# Patient Record
Sex: Female | Born: 1960 | Race: Black or African American | Hispanic: No | Smoking: Never smoker
Health system: Southern US, Community
[De-identification: ages and names within clinical notes are randomized; demographics above are authoritative.]

## PROBLEM LIST (undated history)

## (undated) DIAGNOSIS — I10 Essential (primary) hypertension: Secondary | ICD-10-CM

## (undated) DIAGNOSIS — K219 Gastro-esophageal reflux disease without esophagitis: Secondary | ICD-10-CM

## (undated) DIAGNOSIS — E785 Hyperlipidemia, unspecified: Secondary | ICD-10-CM

## (undated) DIAGNOSIS — J45909 Unspecified asthma, uncomplicated: Secondary | ICD-10-CM

## (undated) HISTORY — DX: Gastro-esophageal reflux disease without esophagitis: K21.9

## (undated) HISTORY — DX: Hyperlipidemia, unspecified: E78.5

---

## 2015-04-07 DIAGNOSIS — M5440 Lumbago with sciatica, unspecified side: Secondary | ICD-10-CM | POA: Insufficient documentation

## 2015-04-07 DIAGNOSIS — E785 Hyperlipidemia, unspecified: Secondary | ICD-10-CM | POA: Insufficient documentation

## 2015-04-07 DIAGNOSIS — G43009 Migraine without aura, not intractable, without status migrainosus: Secondary | ICD-10-CM | POA: Insufficient documentation

## 2015-04-07 DIAGNOSIS — F419 Anxiety disorder, unspecified: Secondary | ICD-10-CM | POA: Insufficient documentation

## 2015-04-07 DIAGNOSIS — G4700 Insomnia, unspecified: Secondary | ICD-10-CM | POA: Insufficient documentation

## 2015-04-07 DIAGNOSIS — G4733 Obstructive sleep apnea (adult) (pediatric): Secondary | ICD-10-CM | POA: Insufficient documentation

## 2015-04-07 DIAGNOSIS — H8110 Benign paroxysmal vertigo, unspecified ear: Secondary | ICD-10-CM | POA: Insufficient documentation

## 2015-04-07 DIAGNOSIS — M5416 Radiculopathy, lumbar region: Secondary | ICD-10-CM | POA: Insufficient documentation

## 2015-04-07 DIAGNOSIS — G5600 Carpal tunnel syndrome, unspecified upper limb: Secondary | ICD-10-CM | POA: Insufficient documentation

## 2015-04-07 DIAGNOSIS — I10 Essential (primary) hypertension: Secondary | ICD-10-CM | POA: Insufficient documentation

## 2015-04-07 DIAGNOSIS — G8929 Other chronic pain: Secondary | ICD-10-CM | POA: Insufficient documentation

## 2015-11-29 DIAGNOSIS — J309 Allergic rhinitis, unspecified: Secondary | ICD-10-CM | POA: Insufficient documentation

## 2015-11-29 DIAGNOSIS — E6609 Other obesity due to excess calories: Secondary | ICD-10-CM | POA: Insufficient documentation

## 2016-02-24 HISTORY — PX: ANKLE SURGERY: SHX546

## 2016-03-02 DIAGNOSIS — G8929 Other chronic pain: Secondary | ICD-10-CM | POA: Insufficient documentation

## 2016-03-16 DIAGNOSIS — M7671 Peroneal tendinitis, right leg: Secondary | ICD-10-CM | POA: Insufficient documentation

## 2016-07-03 DIAGNOSIS — J4540 Moderate persistent asthma, uncomplicated: Secondary | ICD-10-CM | POA: Insufficient documentation

## 2018-02-23 HISTORY — PX: LUMBAR FUSION: SHX111

## 2018-09-16 DIAGNOSIS — Z981 Arthrodesis status: Secondary | ICD-10-CM | POA: Insufficient documentation

## 2018-10-19 DIAGNOSIS — R7301 Impaired fasting glucose: Secondary | ICD-10-CM | POA: Insufficient documentation

## 2018-10-19 DIAGNOSIS — R7303 Prediabetes: Secondary | ICD-10-CM | POA: Insufficient documentation

## 2020-04-01 ENCOUNTER — Emergency Department
Admission: EM | Admit: 2020-04-01 | Discharge: 2020-04-01 | Disposition: A | Discharge status: Home / Self Care | Payer: PRIVATE HEALTH INSURANCE | Source: Home / Self Care

## 2020-04-01 ENCOUNTER — Other Ambulatory Visit: Payer: Self-pay

## 2020-04-01 ENCOUNTER — Encounter: Payer: Self-pay | Admitting: Emergency Medicine

## 2020-04-01 DIAGNOSIS — R000 Tachycardia, unspecified: Secondary | ICD-10-CM

## 2020-04-01 DIAGNOSIS — R197 Diarrhea, unspecified: Secondary | ICD-10-CM

## 2020-04-01 DIAGNOSIS — R112 Nausea with vomiting, unspecified: Secondary | ICD-10-CM | POA: Diagnosis not present

## 2020-04-01 DIAGNOSIS — I959 Hypotension, unspecified: Secondary | ICD-10-CM

## 2020-04-01 DIAGNOSIS — R55 Syncope and collapse: Secondary | ICD-10-CM

## 2020-04-01 DIAGNOSIS — R509 Fever, unspecified: Secondary | ICD-10-CM

## 2020-04-01 HISTORY — DX: Unspecified asthma, uncomplicated: J45.909

## 2020-04-01 HISTORY — DX: Essential (primary) hypertension: I10

## 2020-04-01 NOTE — Discharge Instructions (Addendum)
°  You have declined EMS transport but based on your vital signs, it is advised you go to the emergency department for further evaluation and treatment of your symptoms.  Please have your family member drive you directly to the hospital.

## 2020-04-01 NOTE — ED Provider Notes (Signed)
[facility]ica [facility]    CSN: [id number] Arrival date & time: 04/01/20  1930      History   Chief Complaint Chief Complaint  Patient presents with  . Fatigue  . Fever    HPI Jessica Huff is a 60 y.o. female.   HPI  Jessica Huff is a 60 y.o. female presenting to UC with c/o fever Tmax 103, generalized weakness, n/v/d and near syncope that all started yesterday. Pt reports not "fully" passing out but did fall and bumped the front of her head in the bathroom. She had 3 episodes of vomiting and diarrhea yesterday. Denies cough or congestion, denies urinary symptoms. No known sick contacts. 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  Denies chest pain, palpitations or SOB. Pt driven to [facility] by family.  Past Medical History:  Diagnosis Date  . Asthma   . Hypertension     Patient Active Problem List   Diagnosis Date Noted  . IFG (impaired fasting glucose) 10/19/2018  . Peroneal tendonitis, right 03/16/2016  . Allergic rhinitis 11/29/2015  . Benign positional vertigo 04/07/2015  . Chronic bilateral low back pain with sciatica 04/07/2015  . Common migraine 04/07/2015  . Hyperlipidemia 04/07/2015  . Hypertension 04/07/2015  . Insomnia 04/07/2015  . Obstructive sleep apnea 04/07/2015    History reviewed. No pertinent surgical history.  OB History   No obstetric history on file.      Home Medications    Prior to Admission medications   Medication Sig Start Date End Date Taking? Authorizing Provider  albuterol (PROVENTIL) (2.5 MG/3ML) 0.083% nebulizer solution Inhale into the lungs. 02/05/20  Yes [provider]  DULoxetine (CYMBALTA) 60 MG capsule Take by mouth. 10/05/19  Yes [provider]  ezetimibe (ZETIA) 10 MG tablet Take 1 tablet by mouth daily. 01/30/20  Yes [provider]  fluticasone (FLONASE) 50 MCG/ACT nasal spray 2 sprays by Each Nare route daily. 05/22/15  Yes [provider]  levocetirizine (XYZAL) 5 MG tablet Take by  mouth. 07/17/19  Yes [provider]  rosuvastatin (CRESTOR) 40 MG tablet Take 1 tablet by mouth daily. 06/26/16  Yes [provider]  losartan-hydrochlorothiazide (HYZAAR) 100-25 MG tablet Take 1 tablet by mouth daily. 03/20/20   [provider]  Multiple Vitamin (MULTI-VITAMIN) tablet Take 1 tablet by mouth daily.    [provider]    Family History Family History  Problem Relation Age of Onset  . Cancer Father     Social History Social History   Tobacco Use  . Smoking status: Never Smoker  . Smokeless tobacco: Never Used  Vaping Use  . Vaping Use: Never used  Substance Use Topics  . Alcohol use: Not Currently  . Drug use: Never     Allergies   Patient has no known allergies.   Review of Systems Review of Systems  Constitutional: Positive for fatigue and fever. Negative for chills.  HENT: Negative for congestion, ear pain, sore throat, trouble swallowing and voice change.   Respiratory: Negative for cough and shortness of breath.   Cardiovascular: Negative for chest pain and palpitations.  Gastrointestinal: Positive for diarrhea, nausea and vomiting. Negative for abdominal pain.  Genitourinary: Negative for dysuria, frequency, hematuria and urgency.  Musculoskeletal: Positive for back pain and myalgias. Negative for arthralgias.  Skin: Negative for rash.  Neurological: Positive for syncope (near), weakness, light-headedness and headaches. Negative for dizziness.  All other systems reviewed and are negative.    Physical Exam Triage Vital Signs ED Triage  Vitals  Enc Vitals Group     BP 04/01/20 1947 91/64     Pulse Rate 04/01/20 1947 (!) 125     Resp 04/01/20 1947 20     Temp 04/01/20 1947 100.2 F (37.9 C)     Temp Source 04/01/20 1947 Oral     SpO2 04/01/20 1947 96 %     Weight 04/01/20 1946 222 lb (100.7 kg)     Height 04/01/20 1946 5\' 11"  (1.803 m)     Head Circumference --      Peak Flow --      Pain Score --      Pain  Loc --      Pain Edu? --      Excl. in GC? --    No data found.  Updated Vital Signs BP 91/64 (BP Location: Right Arm)   Pulse (!) 125   Temp 100.2 F (37.9 C) (Oral)   Resp 20   Ht 5\' 11"  (1.803 m)   Wt 222 lb (100.7 kg)   SpO2 96%   BMI 30.96 kg/m   Visual Acuity Right Eye Distance:   Left Eye Distance:   Bilateral Distance:    Right Eye Near:   Left Eye Near:    Bilateral Near:     Physical Exam Vitals and nursing note reviewed.  Constitutional:      General: She is not in acute distress.    Appearance: Normal appearance. She is well-developed and well-nourished. She is not ill-appearing, toxic-appearing or diaphoretic.  HENT:     Head: Normocephalic and atraumatic.     Right Ear: Tympanic membrane and ear canal normal.     Left Ear: Tympanic membrane and ear canal normal.     Nose: Nose normal.     Mouth/Throat:     Mouth: Mucous membranes are moist.     Pharynx: Oropharynx is clear.  Eyes:     Extraocular Movements: EOM normal.  Cardiovascular:     Rate and Rhythm: Regular rhythm. Tachycardia present.  Pulmonary:     Effort: Pulmonary effort is normal. No respiratory distress.     Breath sounds: Normal breath sounds. No stridor. No wheezing, rhonchi or rales.  Abdominal:     General: There is no distension.     Palpations: Abdomen is soft.     Tenderness: There is no abdominal tenderness. There is no right CVA tenderness or left CVA tenderness.  Musculoskeletal:        General: Normal range of motion.     Cervical back: Normal range of motion.  Skin:    General: Skin is warm and dry.  Neurological:     Mental Status: She is alert and oriented to person, place, and time.  Psychiatric:        Mood and Affect: Mood and affect normal.        Behavior: Behavior normal.      UC Treatments / Results  Labs (all labs ordered are listed, but only abnormal results are displayed) Labs Reviewed - No data to display  EKG   Radiology No results  found.  Procedures Procedures (including critical care time)  Medications Ordered in UC Medications - No data to display  Initial Impression / Assessment and Plan / UC Course  I have reviewed the triage vital signs and the nursing notes.  Pertinent labs & imaging results that were available during my care of the patient were reviewed by me and considered in my medical decision making (  see chart for details).    Pt is febrile, tachycardic, and hypotensive No specific infection noted on exam Recommend further evaluation and treatment in emergency department. Pt understanding and agreeable with tx plan Declined EMS transport. Will have family member drive her to [facility] Emergency Department. Pt escorted out to car by nursing staff.   Final Clinical Impressions(s) / UC Diagnoses   Final diagnoses:  Tachycardia  Hypotension, unspecified hypotension type  Near syncope  Nausea vomiting and diarrhea  Fever in adult     Discharge Instructions      You have declined EMS transport but based on your vital signs, it is advised you go to the emergency department for further evaluation and treatment of your symptoms.  Please have your family member drive you directly to the hospital.     ED Prescriptions    None     PDMP not reviewed this encounter.   Lurene Shadow, [place] 04/01/20 2023

## 2020-04-01 NOTE — ED Notes (Signed)
Patient is being discharged from the Urgent Care and sent to [facility] Emergency Department via POV w/ sister  & pt's son. Per Waylan Rocher, PA-C., patient is in need of higher level of care due to fever, tachycardia, low BP & hitting head in bath tub on 03/31/20.  Patient & pt's son is aware and verbalizes understanding of plan of care.  Vitals:   04/01/20 1947  BP: 91/64  Pulse: (!) 125  Resp: 20  Temp: 100.2 F (37.9 C)  SpO2: 96%

## 2020-04-01 NOTE — ED Triage Notes (Signed)
Fever, fatigue & weakness since yesterday  Pt fell yesterday in bathroom and bumped her head- denies LOC  COVID booster Pfizer 02/08/20

## 2020-05-10 ENCOUNTER — Ambulatory Visit: Payer: Self-pay | Admitting: Family Medicine

## 2020-05-22 ENCOUNTER — Ambulatory Visit: Payer: Self-pay | Admitting: Family Medicine

## 2020-07-11 ENCOUNTER — Ambulatory Visit: Payer: Self-pay | Admitting: Family Medicine

## 2020-07-16 ENCOUNTER — Ambulatory Visit (INDEPENDENT_AMBULATORY_CARE_PROVIDER_SITE_OTHER): Payer: PRIVATE HEALTH INSURANCE | Admitting: Family Medicine

## 2020-07-16 ENCOUNTER — Other Ambulatory Visit: Payer: Self-pay

## 2020-07-16 ENCOUNTER — Encounter: Payer: Self-pay | Admitting: Family Medicine

## 2020-07-16 VITALS — BP 140/72 | HR 75 | Temp 97.6°F | Ht 71.0 in | Wt 230.0 lb

## 2020-07-16 DIAGNOSIS — E785 Hyperlipidemia, unspecified: Secondary | ICD-10-CM

## 2020-07-16 DIAGNOSIS — J301 Allergic rhinitis due to pollen: Secondary | ICD-10-CM

## 2020-07-16 DIAGNOSIS — I10 Essential (primary) hypertension: Secondary | ICD-10-CM | POA: Diagnosis not present

## 2020-07-16 DIAGNOSIS — J4540 Moderate persistent asthma, uncomplicated: Secondary | ICD-10-CM

## 2020-07-16 DIAGNOSIS — F419 Anxiety disorder, unspecified: Secondary | ICD-10-CM

## 2020-07-16 DIAGNOSIS — R7301 Impaired fasting glucose: Secondary | ICD-10-CM

## 2020-07-16 MED ORDER — FLUTICASONE PROPIONATE 50 MCG/ACT NA SUSP: 2.0000 | Freq: Every day | NASAL | 6 refills | Status: DC

## 2020-07-16 MED ORDER — FLOVENT HFA 110 MCG/ACT IN AERO: 1.0000 | INHALATION_SPRAY | Freq: Two times a day (BID) | RESPIRATORY_TRACT | 12 refills | Status: DC

## 2020-07-16 MED ORDER — EZETIMIBE 10 MG PO TABS: 10.0000 mg | ORAL_TABLET | Freq: Every day | ORAL | 3 refills | Status: DC

## 2020-07-16 MED ORDER — DULOXETINE HCL 60 MG PO CPEP: 60.0000 mg | ORAL_CAPSULE | Freq: Every day | ORAL | 3 refills | Status: DC

## 2020-07-16 MED ORDER — LOSARTAN POTASSIUM-HCTZ 100-25 MG PO TABS: 1.0000 | ORAL_TABLET | Freq: Every day | ORAL | 3 refills | Status: DC

## 2020-07-16 MED ORDER — ROSUVASTATIN CALCIUM 40 MG PO TABS: 40.0000 mg | ORAL_TABLET | Freq: Every day | ORAL | 3 refills | Status: DC

## 2020-07-16 MED ORDER — ALBUTEROL SULFATE HFA 108 (90 BASE) MCG/ACT IN AERS: 2.0000 | INHALATION_SPRAY | Freq: Four times a day (QID) | RESPIRATORY_TRACT | 6 refills | Status: DC | PRN

## 2020-07-16 NOTE — Progress Notes (Signed)
[facility] [address] [place] [address] Dept: [phone] Dept Fax: [phone]  New Patient Office Visit  Subjective:    Patient ID: Jessica Huff, female    DOB: 02/01/1961, 60 y.o..   MRN: [id number]  Chief Complaint  Patient presents with  . Establish Care    NP- establish care.  HTN/meds.      History of Present Illness:  Patient is in today to establish care. Jessica Huff is originally from [place], [place]. Her family moved to [facility] when she was 60 years old. She lived in [place], [place] for about 11 years, but returned to [place]. Jessica Huff is divorced. She has two sons (40, 22). 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 She denies tobacco or drug use. She does drink very intermittently in social settings.  Jessica Huff has a history of hypertension, managed with losartan/HCTZ. She also has hyperlipidemia and is on both rosuvastatin and ezetimibe. Additionally, she has had some elevated blood sugars in the past.  Jessica Huff has a history fo migraine headache.s These typically occur around episodes of weather change. She has associated phonophobia and photophobia. She notes she usually does not take any medication for these, but sleeps it off until it is better.  Jessica Huff has a history of OSA. She does have a CPAP machine. She notes the straps on her mask are loose. She tried ordering some through [place], but they don't seem to work as well. She notes that she does not necessarily feel rested when she wakes up in the morning.  Jessica Huff has a history of asthma. She notes that she has been using her albuterol inhaler daily. She has used a nebulizer in the past, but not currently. She is not treated with a controller medication.  Jessica Huff has a history of low back pain with sciatica. She had a fusion of her L4-L5 vertebrae in 2020. She still notes some right leg weakness at times despite this.  Ms.  Huff has occasional episodes of vertigo. She notes this around times of weather changes. Most of the time this does not give her issuer.  Jessica Huff has a history of insomnia and associated depression/anxiety. She notes she has been treated with Cymbalta for this. She ran out of her needs several weeks ago. She notes that she is feeling recurrent symptoms off of her meds.  Past Medical History: Patient Active Problem List   Diagnosis Date Noted  . IFG (impaired fasting glucose) 10/19/2018  . S/P lumbar fusion 09/16/2018  . Asthma in adult, mild persistent, uncomplicated 07/03/2016  . Chronic pain of right ankle 03/02/2016  . Allergic rhinitis 11/29/2015  . Class 1 obesity due to excess calories with serious comorbidity and body mass index (BMI) of 32.0 to 32.9 in adult 11/29/2015  . Benign positional vertigo 04/07/2015  . Chronic bilateral low back pain with sciatica 04/07/2015  . Common migraine 04/07/2015  . Hyperlipidemia 04/07/2015  . Essential hypertension 04/07/2015  . Insomnia 04/07/2015  . Obstructive sleep apnea 04/07/2015  . Lumbar radiculopathy 04/07/2015  . Anxiety 04/07/2015   Past Surgical History:  Procedure Laterality Date  . ANKLE SURGERY    . BACK SURGERY     Family History  Problem Relation Age of Onset  . Cancer Father        Kidney   Outpatient Medications Prior to Visit  Medication Sig Dispense Refill  . levocetirizine (XYZAL) 5 MG tablet Take by  mouth.    . Multiple Vitamin (MULTI-VITAMIN) tablet Take 1 tablet by mouth daily.    Marland Kitchen albuterol (PROVENTIL) (2.5 MG/3ML) 0.083% nebulizer solution Inhale into the lungs.    . DULoxetine (CYMBALTA) 60 MG capsule Take by mouth.    . ezetimibe (ZETIA) 10 MG tablet Take 1 tablet by mouth daily.    . fluticasone (FLONASE) 50 MCG/ACT nasal spray 2 sprays by Each Nare route daily.    Marland Kitchen losartan-hydrochlorothiazide (HYZAAR) 100-25 MG tablet Take 1 tablet by mouth daily.    . rosuvastatin (CRESTOR) 40 MG tablet  Take 1 tablet by mouth daily.    Marland Kitchen albuterol (VENTOLIN HFA) 108 (90 Base) MCG/ACT inhaler Inhale 2 puffs into the lungs every 6 (six) hours as needed.     No facility-administered medications prior to visit.   No Known Allergies    Objective:   Today's Vitals   07/16/20 1507  BP: 140/72  Pulse: 75  Temp: 97.6 F (36.4 C)  TempSrc: Temporal  SpO2: 98%  Weight: 230 lb (104.3 kg)  Height: 5\' 11"  (1.803 m)   Body mass index is 32.08 kg/m.   General: Well developed, well nourished. No acute distress. Lungs: Clear to auscultation bilaterally. No wheezing, rales or rhonchi. CV: RRR without murmurs or rubs. Pulses 2+ bilaterally. Psych: Alert and oriented. Normal mood and affect.  Health Maintenance Due  Topic Date Due  . HIV Screening  Never done  . Hepatitis C Screening  Never done  . PAP SMEAR-Modifier  04/12/2020     Assessment & Plan:   1. Essential hypertension The systolic blood pressure is a little high today. I recommend we reassess this at her next visit and consider therapy modifications.  - losartan-hydrochlorothiazide (HYZAAR) 100-25 MG tablet; Take 1 tablet by mouth daily.  Dispense: 90 tablet; Refill: 3  2. Moderate persistent asthma without complication based on her daily symptoms, Jessica Huff has moderate persistent asthma. I will add a daily steroid inhaler and have her back within a month to reassess her response. We did discuss the role of quick relief inhalers vs. controller meds in the overall management plan.  - albuterol (VENTOLIN HFA) 108 (90 Base) MCG/ACT inhaler; Inhale 2 puffs into the lungs every 6 (six) hours as needed.  Dispense: 6.7 g; Refill: 6 - fluticasone (FLOVENT HFA) 110 MCG/ACT inhaler; Inhale 1 puff into the lungs 2 (two) times daily.  Dispense: 1 each; Refill: 12  3. IFG (impaired fasting glucose) We will cehck to make sure she has not developed diabetes.  - Hemoglobin A1c; Future  4. Hyperlipidemia, unspecified hyperlipidemia  type Due for assessment. Continue Crestor and Zetia for now.  - Lipid panel; Future - ezetimibe (ZETIA) 10 MG tablet; Take 1 tablet (10 mg total) by mouth daily.  Dispense: 90 tablet; Refill: 3 - rosuvastatin (CRESTOR) 40 MG tablet; Take 1 tablet (40 mg total) by mouth daily.  Dispense: 90 tablet; Refill: 3  5. Anxiety I will restart her duloxetine and monitor for response.  - DULoxetine (CYMBALTA) 60 MG capsule; Take 1 capsule (60 mg total) by mouth daily.  Dispense: 90 capsule; Refill: 3  6. Allergic rhinitis due to pollen, unspecified seasonality We will continue her nasal steroid spray.  - fluticasone (FLONASE) 50 MCG/ACT nasal spray; Place 2 sprays into both nostrils daily.  Dispense: 9.9 mL; Refill: 6  Neale Burly, MD

## 2020-07-18 ENCOUNTER — Other Ambulatory Visit (INDEPENDENT_AMBULATORY_CARE_PROVIDER_SITE_OTHER): Payer: PRIVATE HEALTH INSURANCE

## 2020-07-18 ENCOUNTER — Other Ambulatory Visit: Payer: Self-pay

## 2020-07-18 ENCOUNTER — Encounter: Payer: Self-pay | Admitting: Family Medicine

## 2020-07-18 DIAGNOSIS — E785 Hyperlipidemia, unspecified: Secondary | ICD-10-CM | POA: Diagnosis not present

## 2020-07-18 DIAGNOSIS — R7301 Impaired fasting glucose: Secondary | ICD-10-CM

## 2020-07-18 LAB — LIPID PANEL
Cholesterol: 133 mg/dL (ref 0–200)
HDL: 33.1 mg/dL — ABNORMAL LOW (ref >39.00)
LDL Cholesterol: 78 mg/dL (ref 0–99)
NonHDL: 99.64
Total CHOL/HDL Ratio: 4
Triglycerides: 106 mg/dL (ref 0.0–149.0)
VLDL: 21.2 mg/dL (ref 0.0–40.0)

## 2020-07-18 LAB — HEMOGLOBIN A1C: Hgb A1c MFr Bld: 5.9 % (ref 4.6–6.5)

## 2020-07-18 NOTE — Progress Notes (Signed)
Per orders of Dr. Veto Kemps, pt is here for labs pt tolerated draw well.

## 2020-08-13 ENCOUNTER — Encounter: Payer: Self-pay | Admitting: Family Medicine

## 2020-08-13 ENCOUNTER — Other Ambulatory Visit: Payer: Self-pay

## 2020-08-13 ENCOUNTER — Other Ambulatory Visit (HOSPITAL_COMMUNITY)
Admission: RE | Admit: 2020-08-13 | Discharge: 2020-08-13 | Disposition: A | Discharge status: Home / Self Care | Payer: PRIVATE HEALTH INSURANCE | Source: Ambulatory Visit | Attending: Diagnostic Radiology | Admitting: Diagnostic Radiology

## 2020-08-13 ENCOUNTER — Ambulatory Visit (INDEPENDENT_AMBULATORY_CARE_PROVIDER_SITE_OTHER): Payer: PRIVATE HEALTH INSURANCE | Admitting: Family Medicine

## 2020-08-13 VITALS — BP 116/68 | HR 85 | Temp 97.4°F | Ht 71.0 in | Wt 231.6 lb

## 2020-08-13 DIAGNOSIS — M5441 Lumbago with sciatica, right side: Secondary | ICD-10-CM | POA: Diagnosis not present

## 2020-08-13 DIAGNOSIS — R7303 Prediabetes: Secondary | ICD-10-CM

## 2020-08-13 DIAGNOSIS — J4540 Moderate persistent asthma, uncomplicated: Secondary | ICD-10-CM | POA: Diagnosis not present

## 2020-08-13 DIAGNOSIS — F419 Anxiety disorder, unspecified: Secondary | ICD-10-CM

## 2020-08-13 DIAGNOSIS — E785 Hyperlipidemia, unspecified: Secondary | ICD-10-CM

## 2020-08-13 DIAGNOSIS — G8929 Other chronic pain: Secondary | ICD-10-CM

## 2020-08-13 DIAGNOSIS — Z124 Encounter for screening for malignant neoplasm of cervix: Secondary | ICD-10-CM | POA: Insufficient documentation

## 2020-08-13 DIAGNOSIS — I10 Essential (primary) hypertension: Secondary | ICD-10-CM

## 2020-08-13 NOTE — Progress Notes (Signed)
[facility] [address] [place] [address] Dept: [phone] Dept Fax: [phone]  Office Visit  Subjective:    Patient ID: Jessica Huff, female    DOB: [birth year], 60 y.o..   MRN: [id number]  Chief Complaint  Patient presents with   Gynecologic Exam    Lab follow-up and pap smear.  C/o having back pain.she has taken Gabapentin with little relief.    History of Present Illness:  Patient is in today to complete cervical cancer screening and reassessment from her last visit.  Jessica Huff has a history of high blood pressure. She is currently on Hyzaar. At her last visit, her BP was mildly high. She has been consistent with her medication use.   Jessica Huff has a history of moderate persistent asthma. I added a Flovent inhaler to her regimen at her last visit. She notes she has been able to reduce how often she is needing her albuterol. She only occasionally has nighttime symptoms (about once a week).  Jessica Huff restarted on Cymbalta at her last visit. She notes that this seems to be working well and her anxiety is in control.  Jessica Huff has a history of bilateral low back pain. She had an L4-L5 spinal fusion in 2020, primarily due to lower pain with right-sided sciatica. She has been having more recent lower back pain, more so  the left. She had some gabapentin that she recently restarted, but doesn't feel this is helping her pain. She notes it has been at least 2 years since her last imaging study.  Past Medical History: Patient Active Problem List   Diagnosis Date Noted   Prediabetes 10/19/2018   S/P lumbar fusion 09/16/2018   Asthma in adult, moderate persistent, uncomplicated 07/03/2016   Chronic pain of right ankle 03/02/2016   Allergic rhinitis 11/29/2015   Class 1 obesity due to excess calories with serious comorbidity and body mass index (BMI) of 32.0 to 32.9 in adult 11/29/2015   Benign positional vertigo  04/07/2015   Chronic bilateral low back pain with sciatica 04/07/2015   Common migraine 04/07/2015   Hyperlipidemia 04/07/2015   Essential hypertension 04/07/2015   Insomnia 04/07/2015   Obstructive sleep apnea 04/07/2015   Lumbar radiculopathy 04/07/2015   Anxiety 04/07/2015   Past Surgical History:  Procedure Laterality Date   ANKLE SURGERY Right 2018   LUMBAR FUSION  2020   L4-L5   Family History  Problem Relation Age of Onset   Diabetes Mother    Kidney disease Mother    Cancer Father        Kidney   Outpatient Medications Prior to Visit  Medication Sig Dispense Refill   albuterol (VENTOLIN HFA) 108 (90 Base) MCG/ACT inhaler Inhale 2 puffs into the lungs every 6 (six) hours as needed. 6.7 g 6   DULoxetine (CYMBALTA) 60 MG capsule Take 1 capsule (60 mg total) by mouth daily. 90 capsule 3   ezetimibe (ZETIA) 10 MG tablet Take 1 tablet (10 mg total) by mouth daily. 90 tablet 3   fluticasone (FLONASE) 50 MCG/ACT nasal spray Place 2 sprays into both nostrils daily. 9.9 mL 6   fluticasone (FLOVENT HFA) 110 MCG/ACT inhaler Inhale 1 puff into the lungs 2 (two) times daily. 1 each 12   levocetirizine (XYZAL) 5 MG tablet Take by mouth.     losartan-hydrochlorothiazide (HYZAAR) 100-25 MG tablet Take 1 tablet by mouth daily. 90 tablet 3   Multiple Vitamin (MULTI-VITAMIN) tablet Take 1 tablet by  mouth daily.     rosuvastatin (CRESTOR) 40 MG tablet Take 1 tablet (40 mg total) by mouth daily. 90 tablet 3   No facility-administered medications prior to visit.   No Known Allergies    Objective:   Today's Vitals   08/13/20 1535  BP: 116/68  Pulse: 85  Temp: (!) 97.4 F (36.3 C)  TempSrc: Temporal  SpO2: 96%  Weight: 231 lb 9.6 oz (105.1 kg)  Height: 5\' 11"  (1.803 m)   Body mass index is 32.3 kg/m.   General: Well developed, well nourished. No acute distress. GU: Normal external genitalia, Vaginal mucosa is pink. There is a moderate thick white discharge   present at the  cervix. The cervical mucosa is pink. Uterus is of normal size. There is no CMT. There are   no adnexal masses. Back: Straight. Surgical scar well healed. Mild pain on palpation over lower left paraspinal muscles. Psych: Alert and oriented. Normal mood and affect.  Health Maintenance Due  Topic Date Due   HIV Screening  Never done   Hepatitis C Screening  Never done   Zoster Vaccines- Shingrix (1 of 2) Never done   PAP SMEAR-Modifier  04/12/2020   COVID-19 Vaccine (4 - Booster for Pfizer series) 06/08/2020   Lab Results Lab Results  Component Value Date   HGBA1C 5.9 07/18/2020   Lab Results  Component Value Date   CHOL 133 07/18/2020   HDL 33.10 (L) 07/18/2020   LDLCALC 78 07/18/2020   TRIG 106.0 07/18/2020   CHOLHDL 4 07/18/2020     Assessment & Plan:   1. Essential hypertension Blood pressure is at goal today. We will continue Hyzaar and reassess in 3 months.  2. Asthma in adult, moderate persistent, uncomplicated Asthma is improved after addition of Flovent inhaler.  3. Chronic bilateral low back pain with right-sided sciatica I will check lumbar films to see if there is any new pathology and to assess status of prior lumbar fixation.  - DG Lumbar Spine Complete; Future  4. Hyperlipidemia, unspecified hyperlipidemia type Lipids are at goal on Crestor.  5. Prediabetes Remains in prediabetes range. We discussed weight loss through exercise as the primary approach to reducing her risk of progression to diabetes.  6. Anxiety Stable on Cymbalta.  7. Pap smear for cervical cancer screening  - Cytology - PAP  07/20/2020, MD

## 2020-08-15 ENCOUNTER — Other Ambulatory Visit: Payer: Self-pay

## 2020-08-15 ENCOUNTER — Ambulatory Visit (HOSPITAL_COMMUNITY)
Admission: RE | Admit: 2020-08-15 | Discharge: 2020-08-15 | Disposition: A | Discharge status: Home / Self Care | Payer: PRIVATE HEALTH INSURANCE | Source: Ambulatory Visit | Attending: Family Medicine | Admitting: Family Medicine

## 2020-08-15 DIAGNOSIS — G8929 Other chronic pain: Secondary | ICD-10-CM | POA: Insufficient documentation

## 2020-08-15 DIAGNOSIS — M5441 Lumbago with sciatica, right side: Secondary | ICD-10-CM | POA: Diagnosis present

## 2020-08-15 LAB — CYTOLOGY - PAP
Adequacy: ABSENT
Comment: NEGATIVE
Diagnosis: NEGATIVE
High risk HPV: NEGATIVE

## 2020-11-14 ENCOUNTER — Encounter: Payer: Self-pay | Admitting: Family Medicine

## 2020-11-14 ENCOUNTER — Ambulatory Visit (INDEPENDENT_AMBULATORY_CARE_PROVIDER_SITE_OTHER): Payer: PRIVATE HEALTH INSURANCE | Admitting: Family Medicine

## 2020-11-14 ENCOUNTER — Other Ambulatory Visit: Payer: Self-pay

## 2020-11-14 VITALS — BP 132/78 | HR 67 | Temp 98.5°F | Ht 70.98 in | Wt 228.0 lb

## 2020-11-14 DIAGNOSIS — J4540 Moderate persistent asthma, uncomplicated: Secondary | ICD-10-CM | POA: Diagnosis not present

## 2020-11-14 DIAGNOSIS — Z23 Encounter for immunization: Secondary | ICD-10-CM

## 2020-11-14 DIAGNOSIS — M5441 Lumbago with sciatica, right side: Secondary | ICD-10-CM | POA: Diagnosis not present

## 2020-11-14 DIAGNOSIS — G8929 Other chronic pain: Secondary | ICD-10-CM

## 2020-11-14 DIAGNOSIS — I10 Essential (primary) hypertension: Secondary | ICD-10-CM | POA: Diagnosis not present

## 2020-11-14 MED ORDER — AMITRIPTYLINE HCL 25 MG PO TABS
25.0000 mg | ORAL_TABLET | Freq: Every day | ORAL | 4 refills | Status: AC
Start: 2020-11-14 — End: ?

## 2020-11-14 NOTE — Progress Notes (Signed)
[facility] [address] [place] [address] Dept: [phone] Dept Fax: [phone]  Chronic Care Office Visit  Subjective:    Patient ID: Jessica Huff, female    DOB: April 08, 1960, 60 y.o..   MRN: [id number]  Chief Complaint  Patient presents with   Follow-up    History of Present Illness:  Patient is in today for reassessment of chronic medical issues.  Jessica Huff has a history of high blood pressure. She is currently on Hyzaa and managing well.   Jessica Huff has a history of moderate persistent asthma. I added a Flovent inhaler to her regimen earlier this year. she notes she is only using her albuterol about a once a week.   Jessica Huff has a history of bilateral low back pain. She had an L4-L5 spinal fusion in 2020, primarily due to lower pain with right-sided sciatica. She continues to experience low back pain related to her job. She has been off work the past two weeks and notes her pain is much less when she is not working. She was treated with gabapentin in the past, but this was not effective for her. She also had prior injections, without improvement. She notes there was a prior discussion of possible referral to a pain specialist, but that this never occurred.  Past Medical History: Patient Active Problem List   Diagnosis Date Noted   Prediabetes 10/19/2018   S/P lumbar fusion 09/16/2018   Asthma in adult, moderate persistent, uncomplicated 07/03/2016   Chronic pain of right ankle 03/02/2016   Allergic rhinitis 11/29/2015   Class 1 obesity due to excess calories with serious comorbidity and body mass index (BMI) of 32.0 to 32.9 in adult 11/29/2015   Benign positional vertigo 04/07/2015   Chronic bilateral low back pain with sciatica 04/07/2015   Common migraine 04/07/2015   Hyperlipidemia 04/07/2015   Essential hypertension 04/07/2015   Insomnia 04/07/2015   Obstructive sleep apnea 04/07/2015   Lumbar  radiculopathy 04/07/2015   Anxiety 04/07/2015   Past Surgical History:  Procedure Laterality Date   ANKLE SURGERY Right 2018   LUMBAR FUSION  2020   L4-L5   Family History  Problem Relation Age of Onset   Diabetes Mother    Kidney disease Mother    Cancer Father        Kidney   Outpatient Medications Prior to Visit  Medication Sig Dispense Refill   albuterol (VENTOLIN HFA) 108 (90 Base) MCG/ACT inhaler Inhale 2 puffs into the lungs every 6 (six) hours as needed. 6.7 g 6   DULoxetine (CYMBALTA) 60 MG capsule Take 1 capsule (60 mg total) by mouth daily. 90 capsule 3   ezetimibe (ZETIA) 10 MG tablet Take 1 tablet (10 mg total) by mouth daily. 90 tablet 3   fluticasone (FLONASE) 50 MCG/ACT nasal spray Place 2 sprays into both nostrils daily. 9.9 mL 6   fluticasone (FLOVENT HFA) 110 MCG/ACT inhaler Inhale 1 puff into the lungs 2 (two) times daily. 1 each 12   levocetirizine (XYZAL) 5 MG tablet Take by mouth.     losartan-hydrochlorothiazide (HYZAAR) 100-25 MG tablet Take 1 tablet by mouth daily. 90 tablet 3   Multiple Vitamin (MULTI-VITAMIN) tablet Take 1 tablet by mouth daily.     rosuvastatin (CRESTOR) 40 MG tablet Take 1 tablet (40 mg total) by mouth daily. 90 tablet 3   No facility-administered medications prior to visit.   No Known Allergies    Objective:   Today's Vitals  11/14/20 1101  BP: 132/78  Pulse: 67  Temp: 98.5 F (36.9 C)  SpO2: 96%  Weight: 228 lb (103.4 kg)  Height: 5' 10.98" (1.803 m)  PainSc: 0-No pain   Body mass index is 31.81 kg/m.   General: Well developed, well nourished. No acute distress. Lungs; Clear to auscultation without wheezes, rales, or rhonchi. Psych: Alert and oriented. Normal mood and affect.  Health Maintenance Due  Topic Date Due   HIV Screening  Never done   Hepatitis C Screening  Never done   Zoster Vaccines- Shingrix (1 of 2) Never done   COVID-19 Vaccine (4 - Booster for [employer] series) 06/08/2020   INFLUENZA VACCINE   Never done   Imaging Lumbar x-ray (08/15/2020) IMPRESSION: Postsurgical changes at L4-L5 with stable appearance of the hardware. Mild degenerative changes at L5-S1. Probable calcified uterine fibroid    Assessment & Plan:   1. Essential hypertension Blood pressure is acceptable today. We will continue Hyzaar.  2. Asthma in adult, moderate persistent, uncomplicated Asthma is in better control on Flovent, with only occasional use of albuterol.  3. Chronic bilateral low back pain with right-sided sciatica We will try adding a bedtime dose of amitriptyline to see if this will improve her pain/sleep. I will refer her to physiatry for assessment.  - amitriptyline (ELAVIL) 25 MG tablet; Take 1 tablet (25 mg total) by mouth at bedtime.  Dispense: 30 tablet; Refill: 4 - Ambulatory referral to Physical Medicine Rehab  Loyola Mast, MD

## 2020-11-26 ENCOUNTER — Encounter: Payer: Self-pay | Admitting: Physical Medicine and Rehabilitation

## 2020-12-26 LAB — HM MAMMOGRAPHY

## 2021-01-07 ENCOUNTER — Encounter: Payer: Self-pay | Admitting: Family Medicine

## 2021-01-12 ENCOUNTER — Encounter: Payer: Self-pay | Admitting: Physical Medicine and Rehabilitation

## 2021-02-07 ENCOUNTER — Other Ambulatory Visit: Payer: Self-pay

## 2021-02-07 ENCOUNTER — Ambulatory Visit (INDEPENDENT_AMBULATORY_CARE_PROVIDER_SITE_OTHER): Payer: PRIVATE HEALTH INSURANCE | Admitting: Family Medicine

## 2021-02-07 ENCOUNTER — Other Ambulatory Visit: Payer: Self-pay | Admitting: Family Medicine

## 2021-02-07 ENCOUNTER — Ambulatory Visit (INDEPENDENT_AMBULATORY_CARE_PROVIDER_SITE_OTHER): Payer: PRIVATE HEALTH INSURANCE

## 2021-02-07 ENCOUNTER — Encounter: Payer: Self-pay | Admitting: Family Medicine

## 2021-02-07 VITALS — BP 144/80 | HR 95 | Temp 98.3°F

## 2021-02-07 DIAGNOSIS — M25561 Pain in right knee: Secondary | ICD-10-CM

## 2021-02-07 DIAGNOSIS — R5383 Other fatigue: Secondary | ICD-10-CM | POA: Diagnosis not present

## 2021-02-07 DIAGNOSIS — R5381 Other malaise: Secondary | ICD-10-CM | POA: Diagnosis not present

## 2021-02-07 NOTE — Progress Notes (Signed)
Established Patient Office Visit  Subjective:  Patient ID: Jessica Huff, female    DOB: [birth year]  Age: 60 y.o. MRN: [id number]  CC:  Chief Complaint  Patient presents with   Leg Pain    Pt c/o swelling w/ pain in right knee after falling a day ago due to dizzyness. Pt states she has also ben running a fever. No congestion or cough.     HPI Jessica Huff presents for evaluation of a right knee injury that happened last night.  She was feeling weak and fell backwards and landed to the right with her knee in a flexed position.  He has been sore and swollen and it has been difficult for her to bear weight.  2 days ago she become overwhelmed with malaise and fatigue.  She felt lightheaded and ran a low temperature of 100 degrees.  She has had no headache, stuffy nose or drainage, sore throat, nausea or vomiting, diarrhea or other localizing signs.  The malaise fatigue and fever have passed.  She is feeling better  Past Medical History:  Diagnosis Date   Asthma    GERD (gastroesophageal reflux disease)    Hyperlipidemia    Hypertension     Past Surgical History:  Procedure Laterality Date   ANKLE SURGERY Right 2018   LUMBAR FUSION  2020   L4-L5    Family History  Problem Relation Age of Onset   Diabetes Mother    Kidney disease Mother    Cancer Father        Kidney    Social History   Socioeconomic History   Marital status: Divorced    Spouse name: Not on file   Number of children: 2   Years of education: Not on file   Highest education level: Not on file  Occupational History   Not on file  Tobacco Use   Smoking status: Never   Smokeless tobacco: Never  Vaping Use   Vaping Use: Never used  Substance and Sexual Activity   Alcohol use: Not Currently   Drug use: Never   Sexual activity: Yes  Other Topics Concern   Not on file  Social History Narrative   Not on file   Social Determinants of Health   Financial Resource Strain: Not on file  Food Insecurity:  Not on file  Transportation Needs: Not on file  Physical Activity: Not on file  Stress: Not on file  Social Connections: Not on file  Intimate Partner Violence: Not on file    Outpatient Medications Prior to Visit  Medication Sig Dispense Refill   albuterol (VENTOLIN HFA) 108 (90 Base) MCG/ACT inhaler Inhale 2 puffs into the lungs every 6 (six) hours as needed. 6.7 g 6   amitriptyline (ELAVIL) 25 MG tablet Take 1 tablet (25 mg total) by mouth at bedtime. 30 tablet 4   ezetimibe (ZETIA) 10 MG tablet Take 1 tablet (10 mg total) by mouth daily. 90 tablet 3   fluticasone (FLONASE) 50 MCG/ACT nasal spray Place 2 sprays into both nostrils daily. 9.9 mL 6   fluticasone (FLOVENT HFA) 110 MCG/ACT inhaler Inhale 1 puff into the lungs 2 (two) times daily. 1 each 12   levocetirizine (XYZAL) 5 MG tablet Take by mouth.     losartan-hydrochlorothiazide (HYZAAR) 100-25 MG tablet Take 1 tablet by mouth daily. 90 tablet 3   Multiple Vitamin (MULTI-VITAMIN) tablet Take 1 tablet by mouth daily.     rosuvastatin (CRESTOR) 40 MG tablet Take 1 tablet (40 mg  total) by mouth daily. 90 tablet 3   DULoxetine (CYMBALTA) 60 MG capsule Take 1 capsule (60 mg total) by mouth daily. (Patient not taking: Reported on 02/07/2021) 90 capsule 3   No facility-administered medications prior to visit.    No Known Allergies  ROS Review of Systems  Constitutional:  Negative for chills, diaphoresis, fatigue, fever and unexpected weight change.  HENT: Negative.  Negative for congestion, postnasal drip, rhinorrhea and sore throat.   Eyes:  Negative for photophobia and visual disturbance.  Respiratory:  Negative for shortness of breath and wheezing.   Cardiovascular: Negative.   Gastrointestinal:  Negative for abdominal pain, diarrhea, nausea and vomiting.  Genitourinary:  Negative for dysuria, frequency and urgency.  Musculoskeletal:  Positive for arthralgias and gait problem.  Skin:  Negative for rash.  Neurological:   Negative for light-headedness and headaches.     Objective:    Physical Exam Vitals and nursing note reviewed.  Constitutional:      General: She is not in acute distress.    Appearance: Normal appearance. She is not ill-appearing, toxic-appearing or diaphoretic.  HENT:     Head: Normocephalic and atraumatic.     Right Ear: External ear normal.     Left Ear: External ear normal.     Mouth/Throat:     Mouth: Mucous membranes are moist.     Pharynx: Oropharynx is clear. No oropharyngeal exudate or posterior oropharyngeal erythema.  Eyes:     General: No scleral icterus.       Right eye: No discharge.        Left eye: No discharge.     Extraocular Movements: Extraocular movements intact.     Conjunctiva/sclera: Conjunctivae normal.     Pupils: Pupils are equal, round, and reactive to light.  Cardiovascular:     Rate and Rhythm: Normal rate and regular rhythm.  Pulmonary:     Effort: Pulmonary effort is normal. No respiratory distress.     Breath sounds: No wheezing or rales.  Abdominal:     General: Bowel sounds are normal.     Tenderness: There is no abdominal tenderness.  Musculoskeletal:     Cervical back: No rigidity or tenderness.     Right knee: Swelling present. No deformity, effusion or erythema. Normal range of motion. No tenderness.     Comments: There is no tenderness to palpation the patient says the majority of her pain is on the medial side of her knee.  Lymphadenopathy:     Cervical: No cervical adenopathy.  Skin:    General: Skin is warm and dry.  Neurological:     Mental Status: She is alert and oriented to person, place, and time.  Psychiatric:        Mood and Affect: Mood normal.        Behavior: Behavior normal.    BP (!) 144/80 (BP Location: Left Arm, Patient Position: Sitting, Cuff Size: Large)    Pulse 95    Temp 98.3 F (36.8 C) (Temporal)    SpO2 93%  Wt Readings from Last 3 Encounters:  11/14/20 228 lb (103.4 kg)  08/13/20 231 lb 9.6 oz (105.1  kg)  07/16/20 230 lb (104.3 kg)     Health Maintenance Due  Topic Date Due   Pneumococcal Vaccine 54-66 Years old (1 - PCV) Never done   HIV Screening  Never done   Hepatitis C Screening  Never done   Zoster Vaccines- Shingrix (1 of 2) Never done   COVID-19 Vaccine (  4 - Booster for [employer] series) 04/04/2020    There are no preventive care reminders to display for this patient.  No results found for: TSH No results found for: WBC, HGB, HCT, MCV, PLT No results found for: NA, K, CHLORIDE, CO2, GLUCOSE, BUN, CREATININE, BILITOT, ALKPHOS, AST, ALT, PROT, ALBUMIN, CALCIUM, ANIONGAP, EGFR, GFR Lab Results  Component Value Date   CHOL 133 07/18/2020   Lab Results  Component Value Date   HDL 33.10 (L) 07/18/2020   Lab Results  Component Value Date   LDLCALC 78 07/18/2020   Lab Results  Component Value Date   TRIG 106.0 07/18/2020   Lab Results  Component Value Date   CHOLHDL 4 07/18/2020   Lab Results  Component Value Date   HGBA1C 5.9 07/18/2020      Assessment & Plan:   Problem List Items Addressed This Visit       Other   Acute pain of right knee - Primary   Relevant Orders   DG Knee Complete 4 Views Right   Malaise and fatigue   Relevant Orders   Urinalysis, Routine w reflex microscopic   CBC   Basic metabolic panel    No orders of the defined types were placed in this encounter.   Follow-up: Return follow up with Dr. Gena Fray., for cold packs, ace wrap and tylenol as needed for knee. Libby Maw, MD    12/19 addendum: plain films of knee were equivocal. CT suggested for ongoing symptoms.

## 2021-02-08 LAB — BASIC METABOLIC PANEL
BUN/Creatinine Ratio: 11 — ABNORMAL LOW (ref 12–28)
BUN: 9 mg/dL (ref 8–27)
CO2: 25 mmol/L (ref 20–29)
Calcium: 9 mg/dL (ref 8.7–10.3)
Chloride: 101 mmol/L (ref 96–106)
Creatinine, Ser: 0.81 mg/dL (ref 0.57–1.00)
Glucose: 94 mg/dL (ref 70–99)
Potassium: 4 mmol/L (ref 3.5–5.2)
Sodium: 143 mmol/L (ref 134–144)
eGFR: 83 mL/min/{1.73_m2} (ref >59)

## 2021-02-08 LAB — URINALYSIS, ROUTINE W REFLEX MICROSCOPIC
Bilirubin Urine: NEGATIVE
Glucose, UA: NEGATIVE
Hgb urine dipstick: NEGATIVE
Ketones, ur: NEGATIVE
Leukocytes,Ua: NEGATIVE
Nitrite: NEGATIVE
Protein, ur: NEGATIVE
Specific Gravity, Urine: 1.013 (ref 1.001–1.035)
pH: 5.5 (ref 5.0–8.0)

## 2021-02-08 LAB — CBC
Hematocrit: 40.6 % (ref 34.0–46.6)
Hemoglobin: 14 g/dL (ref 11.1–15.9)
MCH: 29.2 pg (ref 26.6–33.0)
MCHC: 34.5 g/dL (ref 31.5–35.7)
MCV: 85 fL (ref 79–97)
Platelets: 264 10*3/uL (ref 150–450)
RBC: 4.8 x10E6/uL (ref 3.77–5.28)
RDW: 13.5 % (ref 11.7–15.4)
WBC: 5.2 10*3/uL (ref 3.4–10.8)

## 2021-02-10 ENCOUNTER — Encounter
Payer: PRIVATE HEALTH INSURANCE | Attending: Physical Medicine and Rehabilitation | Admitting: Physical Medicine and Rehabilitation

## 2021-02-10 ENCOUNTER — Other Ambulatory Visit: Payer: Self-pay

## 2021-02-10 ENCOUNTER — Encounter: Payer: Self-pay | Admitting: Physical Medicine and Rehabilitation

## 2021-02-10 VITALS — BP 114/76 | HR 95 | Ht 70.9 in | Wt 228.0 lb

## 2021-02-10 DIAGNOSIS — Z981 Arthrodesis status: Secondary | ICD-10-CM | POA: Diagnosis not present

## 2021-02-10 DIAGNOSIS — R29898 Other symptoms and signs involving the musculoskeletal system: Secondary | ICD-10-CM | POA: Diagnosis not present

## 2021-02-10 DIAGNOSIS — M5416 Radiculopathy, lumbar region: Secondary | ICD-10-CM | POA: Diagnosis not present

## 2021-02-10 MED ORDER — PREGABALIN 50 MG PO CAPS: 50.0000 mg | ORAL_CAPSULE | Freq: Two times a day (BID) | ORAL | 5 refills | Status: DC

## 2021-02-10 NOTE — Progress Notes (Signed)
Subjective:    Patient ID: Jessica Huff, female    DOB: 1960/05/20, 60 y.o.   MRN: [id number]  HPI Pt is a 60 yr old female with hx of asthma, HTN, BMI of 32; and chronic low back pain with sciatica as well as hx of lumbar fusion- L4/5 had surgery for back pain. Here for evaluation of back pain.   Didn't fix the issue- never got better after surgery.    Here for evaluation for chronic back pain.   R sided sciatica- to R foot/toes- has associated tingling.  Can have R leg give out on her.   Most of pain in back- starts on the Right side and now L side is also bothering her lately.   Filed for disability- but then surgeon "sent her back to work".  [facility].   Pain is more aching pain- constant- esp when working; but goes up and down in intensity due to movements.   Better with- sitting down; leaning forwards takes the pressure off at work-  leaning frontwards.   Worse- bad weather/rain; standing in 1 spot; lifting weight;     Tried: Gabapentin- didn't work.   Duloxetine 60 mg daily- for pain - helps her sleep at night- takes at night- might have helped pain at night, but not during day.  Elavil- 25 mg QHS Never tried Lyrica; Keppra; Trileptal  Has tried epidural injections- at first it helped- didn't last that long- ~ 1 month the first one-  Last injection under fluoroscopy- was in early 2020-  didn't help much .  L4/5 fusion due to pain- July 2020.   Walks with cane- because fell and hurt R knee.  Wearing brace due to swelling/pain.  Small quad cane using Occurred last week- and was taken to PCP-  No fractures per xray results on 02/07/21- but has moderated soft tissue swelling and small joint effusion.   Not able to work with a cane.   Says also has weakness in R leg- ever since surgery- however is mainly in R HF- and had surgery at L4/5  Social Hx: When not working, pain is better Works at [employer]- a lot  of bending/lifting, pushing; - usually picks up to 50 lbs.  Not allowed to work with cane.   Pain Inventory Average Pain 5 Pain Right Now 5 My pain is constant, sharp, stabbing, and aching  In the last 24 hours, has pain interfered with the following? General activity 7 Relation with others 8 Enjoyment of life 8 What TIME of day is your pain at its worst? morning , daytime, evening, and night Sleep (in general) Fair  Pain is worse with: walking, bending, inactivity, standing, and some activites Pain improves with: rest Relief from Meds:  tylenol helps a little  use a cane how many minutes can you walk? 15 mins ability to climb steps?  yes do you drive?  yes  employed # of hrs/week 40 hours a week  weakness numbness tingling trouble walking  Any changes since last visit?  no x-rays  Any changes since last visit?  no    Family History  Problem Relation Age of Onset   Diabetes Mother    Kidney disease Mother    Cancer Father        Kidney   Social History   Socioeconomic History   Marital status: Divorced    Spouse name: Not on file   Number  of children: 2   Years of education: Not on file   Highest education level: Not on file  Occupational History   Not on file  Tobacco Use   Smoking status: Never   Smokeless tobacco: Never  Vaping Use   Vaping Use: Never used  Substance and Sexual Activity   Alcohol use: Not Currently   Drug use: Never   Sexual activity: Yes  Other Topics Concern   Not on file  Social History Narrative   Not on file   Social Determinants of Health   Financial Resource Strain: Not on file  Food Insecurity: Not on file  Transportation Needs: Not on file  Physical Activity: Not on file  Stress: Not on file  Social Connections: Not on file   Past Surgical History:  Procedure Laterality Date   ANKLE SURGERY Right 2018   LUMBAR FUSION  2020   L4-L5   Past Medical History:  Diagnosis Date   Asthma    GERD  (gastroesophageal reflux disease)    Hyperlipidemia    Hypertension    BP 114/76    Pulse 95    Ht 5' 10.9" (1.801 m)    Wt 228 lb (103.4 kg)    SpO2 96%    BMI 31.89 kg/m   Opioid Risk Score:   Fall Risk Score:  `1  Depression screen PHQ 2/9  Depression screen [facility] 2/9 02/10/2021 11/14/2020 07/16/2020  Decreased Interest 1 0 0  Down, Depressed, Hopeless 0 0 0  PHQ - 2 Score 1 0 0  Altered sleeping 2 - -  Tired, decreased energy 3 - -  Change in appetite 0 - -  Feeling bad or failure about yourself  0 - -  Trouble concentrating 0 - -  Moving slowly or fidgety/restless 3 - -  Suicidal thoughts 0 - -  PHQ-9 Score 9 - -    Review of Systems  Musculoskeletal:  Positive for back pain and gait problem.  All other systems reviewed and are negative.     Objective:   Physical Exam Awake, alert, appropriate, using small quad cane to walk; has R knee hinged neoprene knee brace; NAD  MS: RLE_ HF 2+/5- pain vs weakness; KE/KF 4-/5; DF and PF 5/5 LLE_ HF 4+/5; KE/KF 5/5 and DF and PF 5-/5   Neuro: Intact to light touch on LLE, however decreased to light touch from L3- S1 on RLE- intact in L1/L2 dermatome on RLE.  No hoffman's or clonus in Les B/L DTRs-  1+ in patella and achilles B/L   Gait: obvious R HF weakness with gait- using R knee to try and compensate.      Assessment & Plan:   Pt is a 60 yr old female with hx of asthma, prediabetes; HTN, BMI of 32; and chronic low back pain with sciatica as well as hx of lumbar fusion- L4/5 had surgery for back pain. Here for evaluation of back pain s/p L4/5 back fusion and new RLE weakness.    MRI lumbar spine- since has R hip flexion weakness which she reports has had for months/if not sine surgery, however is impairing her gait more- and drags RLE as a result- concern for herniated disc at higher up. No increased DTRs/spasticity, so more likely L1/2   For sciatica/back pain- Pregabalin/Lyrica- 50 mg 2x/day x 1 week, then 100 mg  2x/day- for back/nerve pain. Not still on gabapentin Can take up to 2 weeks to start to kick in.   3. Continue  Duloxetine/Cymablta, since it can also help pain.    4. Call me 1-2 days after MRI done- so we can go over results.    5.  Her job doesn't want her to come back to work- because she's using cane- and cannot work right now due to R knee strain- need to d/w PCP to get note, however truly feel that she shouldn't be standing/walking/lifting right now with back AND R knee strain.   6. F/U in 3 months, but call me at 4-6 weeks to let me know how meds working.   I spent a total of 46 minutes on visit- discussing pain med options and RLE weakness and MRI - no antianxiety meds for MRI machine.

## 2021-02-10 NOTE — Patient Instructions (Signed)
Pt is a 60 yr old female with hx of asthma, prediabetes; HTN, BMI of 32; and chronic low back pain with sciatica as well as hx of lumbar fusion- L4/5 had surgery for back pain. Here for evaluation of back pain s/p L4/5 back fusion and new RLE weakness.    MRI lumbar spine- since has R hip flexion weakness which she reports has had for months/if not sine surgery, however is impairing her gait more- and drags RLE as a result- concern for herniated disc at higher up. No increased DTRs/spasticity, so more likely L1/2   For sciatica/back pain- Pregabalin/Lyrica- 50 mg 2x/day x 1 week, then 100 mg 2x/day- for back/nerve pain. Not still on gabapentin Can take up to 2 weeks to start to kick in.   3. Continue Duloxetine/Cymablta, since it can also help pain.    4. Call me 1-2 days after MRI done- so we can go over results.    5.  Her job doesn't want her to come back to work- because she's using cane- and cannot work right now due to R knee strain- need to d/w PCP to get note, however truly feel that she shouldn't be standing/walking/lifting right now with back AND R knee strain.   6. F/U in 3 months, but call me at 4-6 weeks to let me know how meds working.

## 2021-02-11 ENCOUNTER — Telehealth: Payer: Self-pay | Admitting: Family Medicine

## 2021-02-11 NOTE — Telephone Encounter (Signed)
Pt is wanting to know-how long she can be out of work? She was seen on the 16th by Dr. Doreene Burke for her knee being swollen. She needs a cane to walk and she can not use her cane at work. If a note is written she has authorized her sister Baxter Hire to be able to pick it up. If any further questions please advise pt at [phone].

## 2021-02-11 NOTE — Telephone Encounter (Signed)
Please review and advise. Thanks. Dm/cma  

## 2021-02-11 NOTE — Telephone Encounter (Signed)
Lft VM to rtn call. Dm/cma  

## 2021-02-12 ENCOUNTER — Telehealth: Payer: Self-pay | Admitting: *Deleted

## 2021-02-12 NOTE — Telephone Encounter (Signed)
Prior auth submitted to [employer]  via CoverMyMeds for pregabalin 50 mg #120 for ramp up dosing. If approved and higher dose tolerated (100 mg bid) then should be changed to to higher dose capsule next fill.

## 2021-02-12 NOTE — Telephone Encounter (Signed)
Patient notified VIA phone and will have someone pick it up for her.  Placed in folder in front office.  Dm/cma

## 2021-02-13 ENCOUNTER — Other Ambulatory Visit: Payer: Self-pay | Admitting: Family Medicine

## 2021-02-13 ENCOUNTER — Other Ambulatory Visit: Payer: Self-pay

## 2021-02-13 DIAGNOSIS — J301 Allergic rhinitis due to pollen: Secondary | ICD-10-CM

## 2021-02-14 ENCOUNTER — Encounter: Payer: Self-pay | Admitting: Family Medicine

## 2021-02-14 ENCOUNTER — Ambulatory Visit (INDEPENDENT_AMBULATORY_CARE_PROVIDER_SITE_OTHER): Payer: PRIVATE HEALTH INSURANCE | Admitting: Family Medicine

## 2021-02-14 VITALS — BP 118/70 | HR 94 | Temp 97.5°F | Ht 70.0 in | Wt 225.4 lb

## 2021-02-14 DIAGNOSIS — M25561 Pain in right knee: Secondary | ICD-10-CM

## 2021-02-14 DIAGNOSIS — I10 Essential (primary) hypertension: Secondary | ICD-10-CM

## 2021-02-14 DIAGNOSIS — Z23 Encounter for immunization: Secondary | ICD-10-CM | POA: Diagnosis not present

## 2021-02-14 NOTE — Progress Notes (Signed)
[facility] [address] [place] [address] Dept: [phone] Dept Fax: [phone]  Office Visit  Subjective:    Patient ID: Jessica Huff, female    DOB: 1960-04-26, 60 y.o..   MRN: [id number]  Chief Complaint  Patient presents with   Follow-up    3 month follow up on BP also follow up on right knee due to fall. Per patient still in pain.     History of Present Illness:  Patient is in today for reassessment of her blood pressure. Jessica Huff has a history of high blood pressure. She is currently on Hyzaar and managing well.  Jessica Huff had a recent injury to her right knee. She feel in her bathroom on 12/16, with her knees folding up under her weight. She had significant trouble weight bearing afterwards. She has been using a brace and a cane to assist with ambulation. She has been unable to work, as her work involves standing and walking all day, with occasional bending/lifting. Her pain and swelling are mildly improved, but not resolved.  Jessica Huff has a history of bilateral low back pain. She had an L4-L5 spinal fusion in 2020, primarily due to lower pain with right-sided sciatica. She continues to experience low back pain related to her job. She has been evaluated by Dr. Dagoberto Ligas and an MRI of the lumbar spine is pending.  Past Medical History: Patient Active Problem List   Diagnosis Date Noted   Acute pain of right knee 02/07/2021   Malaise and fatigue 02/07/2021   Prediabetes 10/19/2018   S/P lumbar fusion 09/16/2018   Asthma in adult, moderate persistent, uncomplicated 33/00/7622   Chronic pain of right ankle 03/02/2016   Allergic rhinitis 11/29/2015   Class 1 obesity due to excess calories with serious comorbidity and body mass index (BMI) of 32.0 to 32.9 in adult 11/29/2015   Benign positional vertigo 04/07/2015   Chronic bilateral low back pain with sciatica 04/07/2015   Common migraine 04/07/2015    Hyperlipidemia 04/07/2015   Essential hypertension 04/07/2015   Insomnia 04/07/2015   Obstructive sleep apnea 04/07/2015   Lumbar radiculopathy 04/07/2015   Anxiety 04/07/2015   Past Surgical History:  Procedure Laterality Date   ANKLE SURGERY Right 2018   LUMBAR FUSION  2020   L4-L5   Family History  Problem Relation Age of Onset   Diabetes Mother    Kidney disease Mother    Cancer Father        Kidney   Outpatient Medications Prior to Visit  Medication Sig Dispense Refill   albuterol (VENTOLIN HFA) 108 (90 Base) MCG/ACT inhaler Inhale 2 puffs into the lungs every 6 (six) hours as needed. 6.7 g 6   amitriptyline (ELAVIL) 25 MG tablet Take 1 tablet (25 mg total) by mouth at bedtime. 30 tablet 4   ezetimibe (ZETIA) 10 MG tablet Take 1 tablet (10 mg total) by mouth daily. 90 tablet 3   fluticasone (FLONASE) 50 MCG/ACT nasal spray SHAKE LIQUID AND USE 2 SPRAYS IN EACH NOSTRIL DAILY 16 g 6   fluticasone (FLOVENT HFA) 110 MCG/ACT inhaler Inhale 1 puff into the lungs 2 (two) times daily. 1 each 12   levocetirizine (XYZAL) 5 MG tablet Take by mouth.     losartan-hydrochlorothiazide (HYZAAR) 100-25 MG tablet Take 1 tablet by mouth daily. 90 tablet 3   Multiple Vitamin (MULTI-VITAMIN) tablet Take 1 tablet by mouth daily.     rosuvastatin (CRESTOR) 40 MG tablet Take 1 tablet (  40 mg total) by mouth daily. 90 tablet 3   DULoxetine (CYMBALTA) 60 MG capsule Take 1 capsule (60 mg total) by mouth daily. (Patient not taking: Reported on 02/14/2021) 90 capsule 3   pregabalin (LYRICA) 50 MG capsule Take 1 capsule (50 mg total) by mouth 2 (two) times daily. 50 mg BID x 1 week, then 100 mg BID- for back/nerve pain (Patient not taking: Reported on 02/14/2021) 120 capsule 5   No facility-administered medications prior to visit.   No Known Allergies    Objective:   Today's Vitals   02/14/21 1059  BP: 118/70  Pulse: 94  Temp: (!) 97.5 F (36.4 C)  TempSrc: Temporal  SpO2: 97%  Weight: 225 lb  6.4 oz (102.2 kg)  Height: '5\' 10"'  (1.778 m)   Body mass index is 32.34 kg/m.   General: Well developed, well nourished. No acute distress. Extremities: The right knee remains mildly swollen. There is tenderness along the medial aspect of the   patella. Psych: Alert and oriented. Normal mood and affect.  Health Maintenance Due  Topic Date Due   Pneumococcal Vaccine 30-37 Years old (1 - PCV) Never done   HIV Screening  Never done   Hepatitis C Screening  Never done   Zoster Vaccines- Shingrix (1 of 2) Never done   COVID-19 Vaccine (4 - Booster for Pfizer series) 04/04/2020   Lab Results Last CBC Lab Results  Component Value Date   WBC 5.2 02/07/2021   HGB 14.0 02/07/2021   HCT 40.6 02/07/2021   MCV 85 02/07/2021   MCH 29.2 02/07/2021   RDW 13.5 02/07/2021   PLT 264 09/81/1914   Last metabolic panel Lab Results  Component Value Date   GLUCOSE 94 02/07/2021   NA 143 02/07/2021   K 4.0 02/07/2021   CL 101 02/07/2021   CO2 25 02/07/2021   BUN 9 02/07/2021   CREATININE 0.81 02/07/2021   EGFR 83 02/07/2021   CALCIUM 9.0 02/07/2021   Imaging: Right knee x-ray (12/1 07/2020) IMPRESSION: Nonspecific irregularity of the medial patella with associated soft tissue swelling and small joint effusion. CT of the knee may be considered for further evaluation.  Assessment & Plan:   1. Acute pain of right knee Jessica Huff remains off work. I recommend she continue to use her knee brace and her cane. I completed medical certification for a temporary disability parking placard. I will order a CT scan to further evaluate her knee injury. Jessica Huff will obtain FMLA paperwork from her HR department, complete her portion, and drop off for my review. I will see her back in 2 weeks.  - CT KNEE RIGHT WO CONTRAST; Future  2. Essential hypertension BP is at goal. Continue Hyzaar.  Haydee Salter, MD

## 2021-02-18 NOTE — Telephone Encounter (Signed)
Pt called requesting a note for work from her appt on 12/23. She mentioned Dr Veto Kemps said she would be out until 1/2.

## 2021-02-18 NOTE — Telephone Encounter (Signed)
Patient notified VIA phone and will come by to pick up letter.  Dm/cma

## 2021-02-19 ENCOUNTER — Ambulatory Visit (HOSPITAL_BASED_OUTPATIENT_CLINIC_OR_DEPARTMENT_OTHER)
Admission: RE | Admit: 2021-02-19 | Discharge: 2021-02-19 | Disposition: A | Discharge status: Home / Self Care | Payer: PRIVATE HEALTH INSURANCE | Source: Ambulatory Visit | Attending: Family Medicine | Admitting: Family Medicine

## 2021-02-19 ENCOUNTER — Other Ambulatory Visit: Payer: Self-pay

## 2021-02-19 DIAGNOSIS — M25561 Pain in right knee: Secondary | ICD-10-CM | POA: Insufficient documentation

## 2021-02-19 NOTE — Telephone Encounter (Signed)
Pregabalin approved by [employer], but at last visit MD discontinued at request of pt (02/14/21).

## 2021-02-20 ENCOUNTER — Other Ambulatory Visit: Payer: Self-pay | Admitting: Family Medicine

## 2021-02-20 DIAGNOSIS — M25561 Pain in right knee: Secondary | ICD-10-CM

## 2021-02-20 DIAGNOSIS — M1711 Unilateral primary osteoarthritis, right knee: Secondary | ICD-10-CM

## 2021-02-20 NOTE — Progress Notes (Signed)
Imaging CT of Right Knee wo contrast (02/19/2021) IMPRESSION: 1. Moderate-sized joint effusion.  No evidence of acute fracture. 2. Mild medial extrusion of the medial meniscal body, which can be seen in the setting of a meniscus tear. Consider MRI. 3. Tricompartment osteoarthritis, worst in the medial compartment. 4. Tiny, 2 mm intra-articular joint bodies along the posterior aspect of the intercondylar notch. 5. Evidence of old MCL injury.  Plan: I called and discussed findings with [name]. She remains out of work. I will refer her to orthopedics for more urgent assessment.  Loyola Mast, MD

## 2021-02-25 ENCOUNTER — Telehealth: Payer: Self-pay | Admitting: Family Medicine

## 2021-02-25 NOTE — Telephone Encounter (Signed)
Spoke to patient after checking to see if we received any paperwork, advised that haven't received them.  She will call them back to have them fax it to [place].  Dm/cma

## 2021-02-25 NOTE — Telephone Encounter (Signed)
Received [employer] disablity request for medical records/tests.  Will print and fax them tomorrow. Dm/cma

## 2021-02-26 ENCOUNTER — Ambulatory Visit (INDEPENDENT_AMBULATORY_CARE_PROVIDER_SITE_OTHER): Payer: PRIVATE HEALTH INSURANCE | Admitting: Family

## 2021-02-26 ENCOUNTER — Encounter: Payer: Self-pay | Admitting: Family

## 2021-02-26 DIAGNOSIS — M25561 Pain in right knee: Secondary | ICD-10-CM | POA: Diagnosis not present

## 2021-02-26 NOTE — Progress Notes (Signed)
Office Visit Note   Patient: Jessica Huff           Date of Birth: 1960-06-13           MRN: [id number] Visit Date: 02/26/2021              Requested by: Loyola Mast, MD [address] [place],  [place] [address] PCP: Loyola Mast, MD  Chief Complaint  Patient presents with   Right Knee - Pain      HPI: Jessica Huff is a 61 year old woman who presents today for initial evaluation of right knee pain.  She initially had an injury on December 16 of last year and that she fell in the bathroom.  All that under her legs she has significant trouble bearing weight after this.  She has been using a brace and a cane to aid with ambulation she has been unable to return to work due to pain as her work involves standing and walking with occasional been requested.  Her pain and swelling has been persistent  She has been seen by her primary care for the same.  She did have radiographs as well as a CT scan knee.  Ct was revealing for: IMPRESSION: 1. Moderate-sized joint effusion.  No evidence of acute fracture. 2. Mild medial extrusion of the medial meniscal body, which can be seen in the setting of a meniscus tear. Consider MRI. 3. Tricompartment osteoarthritis, worst in the medial compartment. 4. Tiny, 2 mm intra-articular joint bodies along the posterior aspect of the intercondylar notch. 5. Evidence of old MCL injury.  Assessment & Plan: Visit Diagnoses:  1. Acute pain of right knee     Plan: Depo-Medrol injection right knee today.  Patient tolerated well.  Her physical therapy comfortable.  We will go ahead and order an MRI scan to further evaluate her meniscal injury.  She will follow-up for MRI review with Dr. Lajoyce Corners to discuss surgical option possible arthroscopy versus considering TKA.  Follow-Up Instructions: No follow-ups on file.   Right Knee Exam   Muscle Strength  The patient has normal right knee strength.  Tenderness  The patient is experiencing  tenderness in the medial joint line and lateral joint line.  Range of Motion  The patient has normal right knee ROM.  Tests  Varus: negative Valgus: negative  Other  Erythema: absent Swelling: mild     Patient is alert, oriented, no adenopathy, well-dressed, normal affect, normal respiratory effort.   Imaging: No results found. No images are attached to the encounter.  Labs: Lab Results  Component Value Date   HGBA1C 5.9 07/18/2020     No results found for: ALBUMIN, PREALBUMIN, CBC  No results found for: MG No results found for: VD25OH  No results found for: PREALBUMIN CBC EXTENDED Latest Ref Rng & Units 02/07/2021  WBC 3.4 - 10.8 x10E3/uL 5.2  RBC 3.77 - 5.28 x10E6/uL 4.80  HGB 11.1 - 15.9 g/dL 41.3  HCT 24.4 - 01.0 % 40.6  PLT 150 - 450 x10E3/uL 264     There is no height or weight on file to calculate BMI.  Orders:  Orders Placed This Encounter  Procedures   MR Knee Right w/o contrast   No orders of the defined types were placed in this encounter.    Procedures: Large Joint Inj: R knee on 02/26/2021 3:12 PM Indications: pain Details: 18 G 1.5 in needle, anteromedial approach Medications: 5 mL lidocaine 1 %; 40 mg methylPREDNISolone acetate 40 MG/ML  Consent was given by the patient.     Clinical Data: No additional findings.  ROS:  All other systems negative, except as noted in the HPI. Review of Systems  Objective: Vital Signs: There were no vitals taken for this visit.  Specialty Comments:  No specialty comments available.  PMFS History: Patient Active Problem List   Diagnosis Date Noted   Acute pain of right knee 02/07/2021   Malaise and fatigue 02/07/2021   Prediabetes 10/19/2018   S/P lumbar fusion 09/16/2018   Asthma in adult, moderate persistent, uncomplicated 07/03/2016   Chronic pain of right ankle 03/02/2016   Allergic rhinitis 11/29/2015   Class 1 obesity due to excess calories with serious comorbidity and body mass  index (BMI) of 32.0 to 32.9 in adult 11/29/2015   Benign positional vertigo 04/07/2015   Chronic bilateral low back pain with sciatica 04/07/2015   Common migraine 04/07/2015   Hyperlipidemia 04/07/2015   Essential hypertension 04/07/2015   Insomnia 04/07/2015   Obstructive sleep apnea 04/07/2015   Lumbar radiculopathy 04/07/2015   Anxiety 04/07/2015   Past Medical History:  Diagnosis Date   Asthma    GERD (gastroesophageal reflux disease)    Hyperlipidemia    Hypertension     Family History  Problem Relation Age of Onset   Diabetes Mother    Kidney disease Mother    Cancer Father        Kidney    Past Surgical History:  Procedure Laterality Date   ANKLE SURGERY Right 2018   LUMBAR FUSION  2020   L4-L5   Social History   Occupational History   Not on file  Tobacco Use   Smoking status: Never   Smokeless tobacco: Never  Vaping Use   Vaping Use: Never used  Substance and Sexual Activity   Alcohol use: Not Currently   Drug use: Never   Sexual activity: Yes

## 2021-02-26 NOTE — Telephone Encounter (Signed)
Paper work for provider received by mail and placed on providers desk to fill out.  Dm/cma

## 2021-02-28 MED ORDER — METHYLPREDNISOLONE ACETATE 40 MG/ML IJ SUSP
40.0000 mg | INTRAMUSCULAR | Status: AC | PRN
  Administered 2021-02-26: 40 mg via INTRA_ARTICULAR

## 2021-02-28 MED ORDER — LIDOCAINE HCL 1 % IJ SOLN
5.0000 mL | INTRAMUSCULAR | Status: AC | PRN
  Administered 2021-02-26: 5 mL

## 2021-02-28 NOTE — Telephone Encounter (Signed)
Form faxed to [employer] @ [phone].  Patient notified [place] phone.   Dm/cma

## 2021-03-02 ENCOUNTER — Ambulatory Visit
Admission: RE | Admit: 2021-03-02 | Discharge: 2021-03-02 | Disposition: A | Discharge status: Home / Self Care | Payer: PRIVATE HEALTH INSURANCE | Source: Ambulatory Visit | Attending: Physical Medicine and Rehabilitation | Admitting: Physical Medicine and Rehabilitation

## 2021-03-02 ENCOUNTER — Other Ambulatory Visit: Payer: Self-pay

## 2021-03-02 DIAGNOSIS — R29898 Other symptoms and signs involving the musculoskeletal system: Secondary | ICD-10-CM

## 2021-03-05 ENCOUNTER — Telehealth: Payer: Self-pay

## 2021-03-05 NOTE — Telephone Encounter (Signed)
Patient called to inform Dr. Berline Chough that she got her MRI

## 2021-03-07 ENCOUNTER — Other Ambulatory Visit: Payer: Self-pay

## 2021-03-10 ENCOUNTER — Other Ambulatory Visit: Payer: Self-pay

## 2021-03-10 ENCOUNTER — Ambulatory Visit (INDEPENDENT_AMBULATORY_CARE_PROVIDER_SITE_OTHER): Payer: PRIVATE HEALTH INSURANCE | Admitting: Family Medicine

## 2021-03-10 VITALS — BP 138/70 | HR 70 | Temp 97.1°F | Ht 70.0 in | Wt 226.0 lb

## 2021-03-10 DIAGNOSIS — Z981 Arthrodesis status: Secondary | ICD-10-CM

## 2021-03-10 DIAGNOSIS — M25561 Pain in right knee: Secondary | ICD-10-CM | POA: Diagnosis not present

## 2021-03-10 DIAGNOSIS — M1711 Unilateral primary osteoarthritis, right knee: Secondary | ICD-10-CM

## 2021-03-10 NOTE — Progress Notes (Signed)
[facility] [address] [place] [address] Dept: [phone] Dept Fax: [phone]  Office Visit  Subjective:    Patient ID: Jessica Huff, female    DOB: 30-Apr-1960, 61 y.o..   MRN: [id number]  Chief Complaint  Patient presents with   Follow-up    2 week f/u knee pain.      History of Present Illness:  Jessica Huff continues to have right knee pain which interferes with ambulation. Her CT scan on 12/28 showed a possible meniscal tear. She was evaluated by Barnie Del, NP (orthopedics) on 1/4. She was given an intraarticular steroid injection, which helped temporarily. She is scheduled for an MRI of the knee on 1/21.  She continues to use a cane to assist with ambulation. She has been unable to work, as her work involves standing and walking all day, with occasional bending/lifting. Her pain and swelling are mildly improved, but not resolved.   Jessica Huff has a history of bilateral low back pain. She had an L4-L5 spinal fusion in 2020, primarily due to lower pain with right-sided sciatica. She continues to experience low back pain related to her job. She has been evaluated by Dr. Berline Chough and had a recent an MRI.  Past Medical History: Patient Active Problem List   Diagnosis Date Noted   Acute pain of right knee 02/07/2021   Malaise and fatigue 02/07/2021   Prediabetes 10/19/2018   S/P lumbar fusion 09/16/2018   Asthma in adult, moderate persistent, uncomplicated 07/03/2016   Chronic pain of right ankle 03/02/2016   Allergic rhinitis 11/29/2015   Class 1 obesity due to excess calories with serious comorbidity and body mass index (BMI) of 32.0 to 32.9 in adult 11/29/2015   Benign positional vertigo 04/07/2015   Chronic bilateral low back pain with sciatica 04/07/2015   Common migraine 04/07/2015   Hyperlipidemia 04/07/2015   Essential hypertension 04/07/2015   Insomnia 04/07/2015   Obstructive sleep apnea 04/07/2015    Lumbar radiculopathy 04/07/2015   Anxiety 04/07/2015   Past Surgical History:  Procedure Laterality Date   ANKLE SURGERY Right 2018   LUMBAR FUSION  2020   L4-L5   Family History  Problem Relation Age of Onset   Diabetes Mother    Kidney disease Mother    Cancer Father        Kidney   Outpatient Medications Prior to Visit  Medication Sig Dispense Refill   albuterol (VENTOLIN HFA) 108 (90 Base) MCG/ACT inhaler Inhale 2 puffs into the lungs every 6 (six) hours as needed. 6.7 g 6   amitriptyline (ELAVIL) 25 MG tablet Take 1 tablet (25 mg total) by mouth at bedtime. 30 tablet 4   ezetimibe (ZETIA) 10 MG tablet Take 1 tablet (10 mg total) by mouth daily. 90 tablet 3   fluticasone (FLONASE) 50 MCG/ACT nasal spray SHAKE LIQUID AND USE 2 SPRAYS IN EACH NOSTRIL DAILY 16 g 6   fluticasone (FLOVENT HFA) 110 MCG/ACT inhaler Inhale 1 puff into the lungs 2 (two) times daily. 1 each 12   levocetirizine (XYZAL) 5 MG tablet Take by mouth.     losartan-hydrochlorothiazide (HYZAAR) 100-25 MG tablet Take 1 tablet by mouth daily. 90 tablet 3   Multiple Vitamin (MULTI-VITAMIN) tablet Take 1 tablet by mouth daily.     rosuvastatin (CRESTOR) 40 MG tablet Take 1 tablet (40 mg total) by mouth daily. 90 tablet 3   No facility-administered medications prior to visit.   No Known Allergies  Objective:   Today's Vitals   03/10/21 0800  BP: 138/70  Pulse: 70  Temp: (!) 97.1 F (36.2 C)  TempSrc: Temporal  SpO2: 98%  Weight: 226 lb (102.5 kg)  Height: 5\' 10"  (1.778 m)   Body mass index is 32.43 kg/m.   General: Well developed, well nourished. No acute distress. Extremities: The right knee remains mildly swollen with a bogginess medially and increased warmth. Psych: Alert and oriented. Normal mood and affect.  Health Maintenance Due  Topic Date Due   Pneumococcal Vaccine 21-29 Years old (1 - PCV) Never done   HIV Screening  Never done   Hepatitis C Screening  Never done   COVID-19 Vaccine (4  - Booster for Pfizer series) 04/04/2020   Imaging: MR Lumbar Spine wo contrast (03/02/2021) IMPRESSION: 1. Status post posterior instrumented fusion at L4-L5 without evidence of complication. 2. Overall mild multilevel degenerative changes throughout the lumbar spine detailed above are not significantly changed since 2021, including mild bilateral subarticular zone and neural foraminal narrowing at L3-L4, mild left subarticular zone and mild left worse than right neural foraminal stenosis at L4-L5, and mild right subarticular zone narrowing at L5-S1, without evidence of nerve root impingement.  Assessment & Plan:   1. Acute pain of right knee, possible medial meniscal tear 2. Primary osteoarthritis of right knee Reviewed orthopedic consult note. MRI pending for 1/21. I instructed Jessica Huff on appropriate use of her cane to offload her injured knee and to navigate stairs. She should remain off work pending the scan and the recommendations of orthopedics.  3. S/P lumbar fusion MR shows stable findings without evidence for any new lumbar nerve impingement. She will follow-up with Dr. Neale Burly, but suspect she needs to focus on physical therapy for the low back pain.  Berline Chough, MD

## 2021-03-15 ENCOUNTER — Other Ambulatory Visit: Payer: Self-pay

## 2021-03-15 ENCOUNTER — Ambulatory Visit
Admission: RE | Admit: 2021-03-15 | Discharge: 2021-03-15 | Disposition: A | Discharge status: Home / Self Care | Payer: PRIVATE HEALTH INSURANCE | Source: Ambulatory Visit | Attending: Family | Admitting: Family

## 2021-03-15 DIAGNOSIS — M25561 Pain in right knee: Secondary | ICD-10-CM

## 2021-03-18 NOTE — Progress Notes (Signed)
Do you mind offering her an appointment to review mri with dr duda, next available

## 2021-03-19 ENCOUNTER — Ambulatory Visit (INDEPENDENT_AMBULATORY_CARE_PROVIDER_SITE_OTHER): Payer: PRIVATE HEALTH INSURANCE | Admitting: Family

## 2021-03-19 DIAGNOSIS — S83281A Other tear of lateral meniscus, current injury, right knee, initial encounter: Secondary | ICD-10-CM | POA: Diagnosis not present

## 2021-03-19 DIAGNOSIS — M1711 Unilateral primary osteoarthritis, right knee: Secondary | ICD-10-CM | POA: Diagnosis not present

## 2021-03-19 NOTE — Progress Notes (Signed)
Office Visit Note   Patient: Jessica Huff           Date of Birth: 03-21-1960           MRN: [id number] Visit Date: 03/19/2021              Requested by: Jessica Mast, MD [address] [place],  [place] [address] PCP: Jessica Mast, MD  Chief Complaint  Patient presents with   Right Knee - Pain    Right knee MRI review       HPI: Jessica Huff is a 61 year old woman who presents today in follow up for right knee pain.    She initially had an injury on December 16 of last year and that she fell in the bathroom.  All that under her legs she has significant trouble bearing weight after this.  She has been using a brace and a cane to aid with ambulation she has been unable to return to work due to pain as her work involves standing and walking with occasional been requested.  Her pain and swelling has been persistent  She has been seen by her primary care for the same.  She did have radiographs as well as a CT scan knee.  Ct was revealing for: IMPRESSION: 1. Moderate-sized joint effusion.  No evidence of acute fracture. 2. Mild medial extrusion of the medial meniscal body, which can be seen in the setting of a meniscus tear. Consider MRI. 3. Tricompartment osteoarthritis, worst in the medial compartment. 4. Tiny, 2 mm intra-articular joint bodies along the posterior aspect of the intercondylar notch. 5. Evidence of old MCL injury.  Today she is seen after an MRI of the right knee.  Overall she states her right knee is feeling much better from a pain standpoint unfortunately does continue to give way she fears falling she has had many falls.  She is not comfortable returning to work at this time  Assessment & Plan: Visit Diagnoses:  No diagnosis found.   Plan: We will extend her out of work for 4 more weeks.  Have requested that she follow-up with Dr. Lajoyce Corners in about 4 weeks to discuss her treatment plan.  She is feeling much better but is concerned she may require  surgical intervention.  Dr. Lajoyce Corners to discuss surgical option possible arthroscopy versus considering TKA.  Follow-Up Instructions: No follow-ups on file.   Right Knee Exam   Muscle Strength  The patient has normal right knee strength.  Tenderness  The patient is experiencing tenderness in the medial joint line and lateral joint line.  Range of Motion  The patient has normal right knee ROM.  Tests  Varus: negative Valgus: negative  Other  Erythema: absent Swelling: mild     Patient is alert, oriented, no adenopathy, well-dressed, normal affect, normal respiratory effort.   Imaging: No results found. No images are attached to the encounter.  Labs: Lab Results  Component Value Date   HGBA1C 5.9 07/18/2020     No results found for: ALBUMIN, PREALBUMIN, CBC  No results found for: MG No results found for: VD25OH  No results found for: PREALBUMIN CBC EXTENDED Latest Ref Rng & Units 02/07/2021  WBC 3.4 - 10.8 x10E3/uL 5.2  RBC 3.77 - 5.28 x10E6/uL 4.80  HGB 11.1 - 15.9 g/dL 32.6  HCT 71.2 - 45.8 % 40.6  PLT 150 - 450 x10E3/uL 264     There is no height or weight on file to calculate BMI.  Orders:  No orders of the defined types were placed in this encounter.  No orders of the defined types were placed in this encounter.    Procedures: No procedures performed  Clinical Data: No additional findings.  ROS:  All other systems negative, except as noted in the HPI. Review of Systems  Objective: Vital Signs: There were no vitals taken for this visit.  Specialty Comments:  No specialty comments available.  PMFS History: Patient Active Problem List   Diagnosis Date Noted   Acute pain of right knee 02/07/2021   Malaise and fatigue 02/07/2021   Prediabetes 10/19/2018   S/P lumbar fusion 09/16/2018   Asthma in adult, moderate persistent, uncomplicated 07/03/2016   Chronic pain of right ankle 03/02/2016   Allergic rhinitis 11/29/2015   Class 1  obesity due to excess calories with serious comorbidity and body mass index (BMI) of 32.0 to 32.9 in adult 11/29/2015   Benign positional vertigo 04/07/2015   Chronic bilateral low back pain with sciatica 04/07/2015   Common migraine 04/07/2015   Hyperlipidemia 04/07/2015   Essential hypertension 04/07/2015   Insomnia 04/07/2015   Obstructive sleep apnea 04/07/2015   Lumbar radiculopathy 04/07/2015   Anxiety 04/07/2015   Past Medical History:  Diagnosis Date   Asthma    GERD (gastroesophageal reflux disease)    Hyperlipidemia    Hypertension     Family History  Problem Relation Age of Onset   Diabetes Mother    Kidney disease Mother    Cancer Father        Kidney    Past Surgical History:  Procedure Laterality Date   ANKLE SURGERY Right 2018   LUMBAR FUSION  2020   L4-L5   Social History   Occupational History   Not on file  Tobacco Use   Smoking status: Never   Smokeless tobacco: Never  Vaping Use   Vaping Use: Never used  Substance and Sexual Activity   Alcohol use: Not Currently   Drug use: Never   Sexual activity: Yes

## 2021-03-20 ENCOUNTER — Ambulatory Visit: Payer: PRIVATE HEALTH INSURANCE | Admitting: Family Medicine

## 2021-03-25 ENCOUNTER — Telehealth: Payer: Self-pay | Admitting: Orthopedic Surgery

## 2021-03-25 NOTE — Telephone Encounter (Signed)
Received medical records release form $25.00 check and disability paperwork from patient     Forwarding to [employer] today

## 2021-03-28 NOTE — Telephone Encounter (Signed)
IC patient, I explained to patient that the office sent records to [facility] so, she does NOT owe [employer] form fee. I asked patient if she wanted to pick up the check she wrote or if wants me to shred it, she stated for me to destroy and shred. I ripped check and placed in our shred it box. I also explained to patient that if a form was received then that would go to [employer] and the form fee would apply. She voiced understanding.

## 2021-04-21 ENCOUNTER — Ambulatory Visit: Payer: PRIVATE HEALTH INSURANCE | Admitting: Orthopedic Surgery

## 2021-04-21 ENCOUNTER — Telehealth: Payer: Self-pay

## 2021-04-21 NOTE — Telephone Encounter (Signed)
PA for Lyrica sent to insurance through [facility]

## 2021-04-22 NOTE — Telephone Encounter (Signed)
Lyrica was denied. Per the denial: The denial was based on our criteria for Pregabalin Cap 50mg .  Per your health plan's criteria, more than 3 capsules per day is covered if you meet the following: (1) One of the following: (A) The drug dose is adjusted until the proper dose is reached (titration purposes) or is started at a higher dose before going down a lower dose (loading-dose purposes) (one time approval). (B) Requested strength/dose is commercially unavailable. (C) You are on different doses and switch back and forth between them (dose-alternating schedule). The information provided does not show that you meet the criteria listed above. Please speak with your doctor about your choices. Reviewed by: .Ph. **Please note: PREGABALIN CAP 100MG  capsules are commercially available and will process for up to 3 capsules per day at your pharmacy. Please advise

## 2021-04-23 MED ORDER — PREGABALIN 100 MG PO CAPS: 100.0000 mg | ORAL_CAPSULE | Freq: Every day | ORAL | 5 refills | Status: DC

## 2021-04-23 NOTE — Telephone Encounter (Signed)
Called [facility] to make sure the Pregabalin prescription that was sent in today would run through insurance. They stated it went through insurance ?

## 2021-04-23 NOTE — Addendum Note (Signed)
Addended by: Courtney Heys on: 04/23/2021 11:37 AM ? ? Modules accepted: Orders ? ?

## 2021-04-24 ENCOUNTER — Ambulatory Visit (INDEPENDENT_AMBULATORY_CARE_PROVIDER_SITE_OTHER): Payer: PRIVATE HEALTH INSURANCE | Admitting: Orthopedic Surgery

## 2021-04-24 DIAGNOSIS — M1711 Unilateral primary osteoarthritis, right knee: Secondary | ICD-10-CM

## 2021-04-25 ENCOUNTER — Encounter
Payer: PRIVATE HEALTH INSURANCE | Attending: Physical Medicine and Rehabilitation | Admitting: Physical Medicine and Rehabilitation

## 2021-04-25 ENCOUNTER — Encounter: Payer: Self-pay | Admitting: Physical Medicine and Rehabilitation

## 2021-04-25 ENCOUNTER — Other Ambulatory Visit: Payer: Self-pay

## 2021-04-25 VITALS — BP 126/79 | HR 100 | Ht 70.0 in | Wt 237.0 lb

## 2021-04-25 DIAGNOSIS — Z981 Arthrodesis status: Secondary | ICD-10-CM | POA: Insufficient documentation

## 2021-04-25 DIAGNOSIS — M5416 Radiculopathy, lumbar region: Secondary | ICD-10-CM | POA: Diagnosis not present

## 2021-04-25 DIAGNOSIS — M25561 Pain in right knee: Secondary | ICD-10-CM | POA: Insufficient documentation

## 2021-04-25 NOTE — Patient Instructions (Signed)
Pt is a 61 yr old female with hx of asthma, prediabetes; HTN, BMI of 32; and chronic low back pain with sciatica as well as hx of lumbar fusion- L4/5 had surgery for back pain. Here for evaluation of back pain s/p L4/5 back fusion and new RLE weakness.  ?Here for f/u on back pain/RLE weakness.  ? ?Suggest using cane to walk as much as possible to help reduce falls.  ? ?2.  Restart Lyrica then 100  (100 mg tablet- not 50 mg -2 tabs) mg 2x/day. For neve/back pain. Is helping pain.  ? ?3.  Let me know how things going after R knee replacement.  ? ?4.   Will see how doing- after knee replacement- and see if need to tweak meds for back pain.  ? ?5. F/U in 3 months-  ?

## 2021-04-25 NOTE — Progress Notes (Signed)
? ?Subjective:  ? ? Patient ID: Jessica Huff, female    DOB: 14-Feb-1961, 61 y.o.   MRN: [id number] ? ?HPI ?Pt is a 61 yr old female with hx of asthma, prediabetes; HTN, BMI of 32; and chronic low back pain with sciatica as well as hx of lumbar fusion- L4/5 had surgery for back pain. Here for evaluation of back pain s/p L4/5 back fusion and new RLE weakness.  ?Here for f/u on back pain/RLE weakness.  ? ?Saw MRI of R knee- supposed to have R TKR- has a lot of arthritis and also a meniscal tear.  ?Went yesterday to see ortho yesterday- will be scheduled in 2-4 weeks ? ?Back pain- ?Has pain every once in awhile. Hasn't been back to work since December- 2022- and thinks that's why pain has been so much better.  ? ?Didn't get Lyrica lately- but they filled it in December- but haven't filled since then.  ? ?Lyrica helped back pain- so would like to restart. Hard to tease out what's the Lyrica and what's not working and standing so much.   ? ?[place] again at [place] time- and was so bad; that's why hasn't been working. So painful.  ? ?Social Hx: ? 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  ? ?Pain Inventory ?Average Pain 4 ?Pain Right Now 3 ?My pain is intermittent and aching ? ?In the last 24 hours, has pain interfered with the following? ?General activity 0 ?Relation with others 0 ?Enjoyment of life 0 ?What TIME of day is your pain at its worst? evening ?Sleep (in general) Fair ? ?Pain is worse with: walking, bending, standing, and some activites ?Pain improves with: rest and medication ?Relief from Meds: 7 ? ?Family History  ?Problem Relation Age of Onset  ? Diabetes Mother   ? Kidney disease Mother   ? Cancer Father   ?     Kidney  ? ?Social History  ? ?Socioeconomic History  ? Marital status: Divorced  ?  Spouse name: Not on file  ? Number of children: 2  ? Years of education: Not on file  ? Highest education level: Not on file  ?Occupational History  ? Not on file  ?Tobacco Use  ?  Smoking status: Never  ? Smokeless tobacco: Never  ?Vaping Use  ? Vaping Use: Never used  ?Substance and Sexual Activity  ? Alcohol use: Not Currently  ? Drug use: Never  ? Sexual activity: Yes  ?Other Topics Concern  ? Not on file  ?Social History Narrative  ? Not on file  ? ?Social Determinants of Health  ? ?Financial Resource Strain: Not on file  ?Food Insecurity: Not on file  ?Transportation Needs: Not on file  ?Physical Activity: Not on file  ?Stress: Not on file  ?Social Connections: Not on file  ? ?Past Surgical History:  ?Procedure Laterality Date  ? ANKLE SURGERY Right 2018  ? LUMBAR FUSION  2020  ? L4-L5  ? ?Past Surgical History:  ?Procedure Laterality Date  ? ANKLE SURGERY Right 2018  ? LUMBAR FUSION  2020  ? L4-L5  ? ?Past Medical History:  ?Diagnosis Date  ? Asthma   ? GERD (gastroesophageal reflux disease)   ? Hyperlipidemia   ? Hypertension   ? ?BP 126/79   Pulse 100   Ht 5\' 10"  (1.778 m)   Wt 237 lb (107.5 kg)   SpO2 98%   BMI 34.01 kg/m?  ? ?Opioid Risk Score:   ?Fall  Risk Score:  `1 ? ?Depression screen PHQ 2/9 ? ?Depression screen [facility] 2/9 02/14/2021 02/10/2021 11/14/2020 07/16/2020  ?Decreased Interest 0 1 0 0  ?Down, Depressed, Hopeless 0 0 0 0  ?PHQ - 2 Score 0 1 0 0  ?Altered sleeping - 2 - -  ?Tired, decreased energy - 3 - -  ?Change in appetite - 0 - -  ?Feeling bad or failure about yourself  - 0 - -  ?Trouble concentrating - 0 - -  ?Moving slowly or fidgety/restless - 3 - -  ?Suicidal thoughts - 0 - -  ?PHQ-9 Score - 9 - -  ? ? ?Review of Systems  ?Musculoskeletal:  Positive for gait problem.  ?     Right knee & right hip pain  ?All other systems reviewed and are negative. ? ?   ?Objective:  ? Physical Exam ? ?Awake, alert, appropriate, NAD ? ?MS: ?RLE- HF 4/5; KE/KF 4+/5; DF/PF 5-/5 ?LLE- HF 4+/5; KE/KF and DF/PF 5-/5 ?Still has pain with exam ? ?Gait- favoring R knee- reduced ROM of R knee- so almost catches toes on R foot-   ? ?Neuro:- decreased sensation in toes- mainly 1st toe on R  foot ? ?   ?Assessment & Plan:  ? ?Pt is a 61 yr old female with hx of asthma, prediabetes; HTN, BMI of 32; and chronic low back pain with sciatica as well as hx of lumbar fusion- L4/5 had surgery for back pain. Here for evaluation of back pain s/p L4/5 back fusion and new RLE weakness.  ?Here for f/u on back pain/RLE weakness.  ? ?Suggest using cane to walk as much as possible to help reduce falls.  ? ?2.  Restart Lyrica then 100  (100 mg tablet- not 50 mg -2 tabs) mg 2x/day. For neve/back pain. Is helping pain.  ? ?3.  Let me know how things going after R knee replacement.  ? ?4.   Will see how doing- after knee replacement- and see if need to tweak meds for back pain.  ? ?5. F/U in 3 months-  ?

## 2021-04-29 ENCOUNTER — Encounter: Payer: Self-pay | Admitting: Orthopedic Surgery

## 2021-04-29 NOTE — Progress Notes (Signed)
? ?Office Visit Note ?  ?Patient: Jessica Huff           ?Date of Birth: [birth year]           ?MRN: [id number] ?Visit Date: 04/24/2021 ?             ?Requested by: Haydee Salter, MD ?[address] ?[place],  [place] [address] ?PCP: Haydee Salter, MD ? ?Chief Complaint  ?Patient presents with  ?? Right Knee - Follow-up  ? ? ? ? ?HPI: ?Patient is a 61 year old woman who presents in follow-up status post MRI scan of her right knee.  Patient still has pain with activities daily living.  She states she cannot work due to her knee pain. ? ?Assessment & Plan: ?Visit Diagnoses:  ?1. Primary osteoarthritis of right knee   ? ? ?Plan: Patient states she would like to proceed with a total knee replacement versus knee arthroscopy risk and benefits were discussed including infection neurovascular injury persistent pain.  Patient states due to her knee pain she cannot work and she was given a note to be out of work until after surgery.  Patient feels like she is at risk of falling. ? ?Follow-Up Instructions: Return in about 2 weeks (around 05/08/2021).  ? ?Ortho Exam ? ?Patient is alert, oriented, no adenopathy, well-dressed, normal affect, normal respiratory effort. ?Examination patient has an antalgic gait she has crepitation with range of motion of right knee the patellofemoral joint and medial lateral joint lines are tender to palpation collaterals and cruciates are stable.  Review of her MRI scan shows intact ligaments with lateral meniscal tearing tricompartmental degenerative changes with evidence of a previous ACL and MCL injury. ? ?Imaging: ?No results found. ?No images are attached to the encounter. ? ?Labs: ?Lab Results  ?Component Value Date  ? HGBA1C 5.9 07/18/2020  ? ? ? ?No results found for: ALBUMIN, PREALBUMIN, CBC ? ?No results found for: MG ?No results found for: VD25OH ? ?No results found for: PREALBUMIN ?CBC EXTENDED Latest Ref Rng & Units 02/07/2021  ?WBC 3.4 - 10.8 x10E3/uL 5.2  ?RBC 3.77 - 5.28  x10E6/uL 4.80  ?HGB 11.1 - 15.9 g/dL 14.0  ?HCT 34.0 - 46.6 % 40.6  ?PLT 150 - 450 x10E3/uL 264  ? ? ? ?There is no height or weight on file to calculate BMI. ? ?Orders:  ?No orders of the defined types were placed in this encounter. ? ?No orders of the defined types were placed in this encounter. ? ? ? Procedures: ?No procedures performed ? ?Clinical Data: ?No additional findings. ? ?ROS: ? ?All other systems negative, except as noted in the HPI. ?Review of Systems ? ?Objective: ?Vital Signs: There were no vitals taken for this visit. ? ?Specialty Comments:  ?No specialty comments available. ? ?PMFS History: ?Patient Active Problem List  ? Diagnosis Date Noted  ?? Tear of lateral meniscus of right knee 03/19/2021  ?? Primary osteoarthritis of right knee 03/19/2021  ?? Acute pain of right knee 02/07/2021  ?? Malaise and fatigue 02/07/2021  ?? Prediabetes 10/19/2018  ?? S/P lumbar fusion 09/16/2018  ?? Asthma in adult, moderate persistent, uncomplicated [id number]  ?? Chronic pain of right ankle 03/02/2016  ?? Allergic rhinitis 11/29/2015  ?? Class 1 obesity due to excess calories with serious comorbidity and body mass index (BMI) of 32.0 to 32.9 in adult 11/29/2015  ?? Benign positional vertigo 04/07/2015  ?? Chronic bilateral low back pain with sciatica 04/07/2015  ?? Common migraine 04/07/2015  ??  Hyperlipidemia 04/07/2015  ?? Essential hypertension 04/07/2015  ?? Insomnia 04/07/2015  ?? Obstructive sleep apnea 04/07/2015  ?? Lumbar radiculopathy 04/07/2015  ?? Anxiety 04/07/2015  ? ?Past Medical History:  ?Diagnosis Date  ?? Asthma   ?? GERD (gastroesophageal reflux disease)   ?? Hyperlipidemia   ?? Hypertension   ?  ?Family History  ?Problem Relation Age of Onset  ?? Diabetes Mother   ?? Kidney disease Mother   ?? Cancer Father   ?     Kidney  ?  ?Past Surgical History:  ?Procedure Laterality Date  ?? ANKLE SURGERY Right 2018  ?? LUMBAR FUSION  2020  ? L4-L5  ? ?Social History  ? ?Occupational History  ?? Not on  file  ?Tobacco Use  ?? Smoking status: Never  ?? Smokeless tobacco: Never  ?Vaping Use  ?? Vaping Use: Never used  ?Substance and Sexual Activity  ?? Alcohol use: Not Currently  ?? Drug use: Never  ?? Sexual activity: Yes  ? ? ? ? ? ?

## 2021-05-19 ENCOUNTER — Ambulatory Visit: Payer: PRIVATE HEALTH INSURANCE | Admitting: Physical Medicine and Rehabilitation

## 2021-06-04 ENCOUNTER — Other Ambulatory Visit: Payer: Self-pay

## 2021-06-09 NOTE — Progress Notes (Signed)
Surgical Instructions ? ? ? Your procedure is scheduled on Wednesday, May 3rd, 2023. ? ? Report [facility] Entrance "A" at 06:30 A.M., then check in with the Admitting office. ? Call this number if you have problems the morning of surgery: ? [phone] ? ? If you have any questions prior to your surgery date call [phone]: Open Monday-Friday 8am-4pm ? ? ? Remember: ? Do not eat after midnight the night before your surgery ? ?You may drink clear liquids until 05:30 the morning of your surgery.   ?Clear liquids allowed are: Water, Non-Citrus Juices (without pulp), Carbonated Beverages, Clear Tea, Black Coffee ONLY (NO MILK, CREAM OR POWDERED CREAMER of any kind), and Gatorade ?  ? Take these medicines the morning of surgery with A SIP OF WATER:  ? ?ezetimibe (ZETIA) ?pregabalin (LYRICA) ?rosuvastatin (CRESTOR) ? ?If needed; ? ?albuterol (VENTOLIN HFA) ?fluticasone (FLONASE) ?fluticasone (FLOVENT HFA) ?levocetirizine (XYZAL)  ? ?Please bring all inhalers with you the day of surgery.   ? ?As of today, STOP taking any Aspirin (unless otherwise instructed by your surgeon) Aleve, Naproxen, Ibuprofen, Motrin, Advil, Goody's, BC's, all herbal medications, fish oil, and all vitamins. ? ? ? The day of surgery: ?         ?Do not wear jewelry or makeup ?Do not wear lotions, powders, perfumes/colognes, or deodorant. ?Do not shave 48 hours prior to surgery.  Men may shave face and neck. ?Do not bring valuables to the hospital. ?Do not wear nail polish, gel polish, artificial nails, or any other type of covering on natural nails (fingers and toes) ?If you have artificial nails or gel coating that need to be removed by a nail salon, please have this removed prior to surgery. Artificial nails or gel coating may interfere with anesthesia's ability to adequately monitor your vital signs. ? ?[facility] is not responsible for any belongings or valuables. .  ? ?Do NOT Smoke (Tobacco/Vaping)  24 hours prior to your  procedure ? ?If you use a CPAP at night, you may bring your mask for your overnight stay. ?  ?Contacts, glasses, hearing aids, dentures or partials may not be worn into surgery, please bring cases for these belongings ?  ?For patients admitted to the hospital, discharge time will be determined by your treatment team. ?  ?Patients discharged the day of surgery will not be allowed to drive home, and someone needs to stay with them for 24 hours. ? ? ?SURGICAL WAITING ROOM VISITATION ?Patients having surgery or a procedure in a hospital may have two support people. ?Children under the age of 50 must have an adult with them who is not the patient. ?They may stay in the waiting area during the procedure and may switch out with other visitors. If the patient needs to stay at the hospital during part of their recovery, the visitor guidelines for inpatient rooms apply. ? ?Please refer to the [facility] website for the visitor guidelines for Inpatients (after your surgery is over and you are in a regular room).  ? ? ?Special instructions:   ? ?Oral Hygiene is also important to reduce your risk of infection.  Remember - BRUSH YOUR TEETH THE MORNING OF SURGERY WITH YOUR REGULAR TOOTHPASTE ? ? ?[facility]- Preparing For Surgery ? ?Before surgery, you can play an important role. Because skin is not sterile, your skin needs to be as free of germs as possible. You can reduce the number of germs on your skin by washing with CHG (chlorahexidine  gluconate) Soap before surgery.  CHG is an antiseptic cleaner which kills germs and bonds with the skin to continue killing germs even after washing.   ? ? ?Please do not use if you have an allergy to CHG or antibacterial soaps. If your skin becomes reddened/irritated stop using the CHG.  ?Do not shave (including legs and underarms) for at least 48 hours prior to first CHG shower. It is OK to shave your face. ? ?Please follow these instructions carefully. ?  ? ? Shower the NIGHT BEFORE  SURGERY and the MORNING OF SURGERY with CHG Soap.  ? If you chose to wash your hair, wash your hair first as usual with your normal shampoo. After you shampoo, rinse your hair and body thoroughly to remove the shampoo.  Then [employer] and genitals (private parts) with your normal soap and rinse thoroughly to remove soap. ? ?After that Use CHG Soap as you would any other liquid soap. You can apply CHG directly to the skin and wash gently with a scrungie or a clean washcloth.  ? ?Apply the CHG Soap to your body ONLY FROM THE NECK DOWN.  Do not use on open wounds or open sores. Avoid contact with your eyes, ears, mouth and genitals (private parts). Wash Face and genitals (private parts)  with your normal soap.  ? ?Wash thoroughly, paying special attention to the area where your surgery will be performed. ? ?Thoroughly rinse your body with warm water from the neck down. ? ?DO NOT shower/wash with your normal soap after using and rinsing off the CHG Soap. ? ?Pat yourself dry with a CLEAN TOWEL. ? ?Wear CLEAN PAJAMAS to bed the night before surgery ? ?Place CLEAN SHEETS on your bed the night before your surgery ? ?DO NOT SLEEP WITH PETS. ? ? ?Day of Surgery: ? ?Take a shower with CHG soap. ?Wear Clean/Comfortable clothing the morning of surgery ?Do not apply any deodorants/lotions.   ?Remember to brush your teeth WITH YOUR REGULAR TOOTHPASTE. ? ? ? ?If you received a COVID test during your pre-op visit  it is requested that you wear a mask when out in public, stay away from anyone that may not be feeling well and notify your surgeon if you develop symptoms. If you have been in contact with anyone that has tested positive in the last 10 days please notify you surgeon. ? ?  ?Please read over the following fact sheets that you were given.   ?

## 2021-06-10 ENCOUNTER — Encounter (HOSPITAL_COMMUNITY)
Admission: RE | Admit: 2021-06-10 | Discharge: 2021-06-10 | Disposition: A | Discharge status: Home / Self Care | Payer: PRIVATE HEALTH INSURANCE | Source: Ambulatory Visit | Attending: Orthopedic Surgery | Admitting: Orthopedic Surgery

## 2021-06-10 ENCOUNTER — Encounter (HOSPITAL_COMMUNITY): Payer: Self-pay

## 2021-06-10 ENCOUNTER — Other Ambulatory Visit: Payer: Self-pay

## 2021-06-10 VITALS — BP 142/74 | HR 73 | Temp 97.8°F | Resp 18 | Ht 72.0 in | Wt 242.0 lb

## 2021-06-10 DIAGNOSIS — Z01818 Encounter for other preprocedural examination: Secondary | ICD-10-CM | POA: Diagnosis present

## 2021-06-10 LAB — BASIC METABOLIC PANEL
Anion gap: 5 (ref 5–15)
BUN: 10 mg/dL (ref 8–23)
CO2: 30 mmol/L (ref 22–32)
Calcium: 9.1 mg/dL (ref 8.9–10.3)
Chloride: 107 mmol/L (ref 98–111)
Creatinine, Ser: 0.69 mg/dL (ref 0.44–1.00)
GFR, Estimated: >60 mL/min (ref >60)
Glucose, Bld: 133 mg/dL — ABNORMAL HIGH (ref 70–99)
Potassium: 3.7 mmol/L (ref 3.5–5.1)
Sodium: 142 mmol/L (ref 135–145)

## 2021-06-10 LAB — CBC
HCT: 42.1 % (ref 36.0–46.0)
Hemoglobin: 14.2 g/dL (ref 12.0–15.0)
MCH: 29.8 pg (ref 26.0–34.0)
MCHC: 33.7 g/dL (ref 30.0–36.0)
MCV: 88.3 fL (ref 80.0–100.0)
Platelets: 277 10*3/uL (ref 150–400)
RBC: 4.77 MIL/uL (ref 3.87–5.11)
RDW: 13.6 % (ref 11.5–15.5)
WBC: 5.8 10*3/uL (ref 4.0–10.5)
nRBC: 0 % (ref 0.0–0.2)

## 2021-06-10 LAB — SURGICAL PCR SCREEN
MRSA, PCR: NEGATIVE
Staphylococcus aureus: NEGATIVE

## 2021-06-10 NOTE — Progress Notes (Signed)
Surgical Instructions ? ? ? Your procedure is scheduled on Wednesday, May 3rd, 2023. ? ? Report to [facility] Main Entrance "A" at 06:30 A.M., then check in with the Admitting office. ? Call this number if you have problems the morning of surgery: ? [phone] ? ? If you have any questions prior to your surgery date call [phone]: Open Monday-Friday 8am-4pm ? ? ? Remember: ? Do not eat after midnight the night before your surgery ? ?You may drink clear liquids until 05:30 the morning of your surgery.   ?Clear liquids allowed are: Water, Non-Citrus Juices (without pulp), Carbonated Beverages, Clear Tea, Black Coffee ONLY (NO MILK, CREAM OR POWDERED CREAMER of any kind), and Gatorade ? ?Please complete your PRE-SURGERY ENSURE that was provided to you by 5:30 the morning of surgery.  Please, if able, drink it in one setting. DO NOT SIP.  ?  ? Take these medicines the morning of surgery with A SIP OF WATER:  ? ?ezetimibe (ZETIA) ?pregabalin (LYRICA) ?rosuvastatin (CRESTOR) ? ?If needed; ? ?albuterol (VENTOLIN HFA) ?fluticasone (FLONASE) ?fluticasone (FLOVENT HFA) ?levocetirizine (XYZAL)  ? ?Please bring all inhalers with you the day of surgery.   ? ?As of today, STOP taking any Aspirin (unless otherwise instructed by your surgeon) Aleve, Naproxen, Ibuprofen, Motrin, Advil, Goody's, BC's, all herbal medications, fish oil, and all vitamins. ? ? ? The day of surgery: ?         ?Do not wear jewelry or makeup ?Do not wear lotions, powders, perfumes  or deodorant. ?Do not shave 48 hours prior to surgery.  ?Do not bring valuables to the hospital. ?Do not wear nail polish, gel polish, artificial nails, or any other type of covering on natural nails (fingers and toes) ?If you have artificial nails or gel coating that need to be removed by a nail salon, please have this removed prior to surgery. Artificial nails or gel coating may interfere with anesthesia's ability to adequately monitor your vital signs. ? ?[facility] is  not responsible for any belongings or valuables. .  ? ?Do NOT Smoke (Tobacco/Vaping)  24 hours prior to your procedure ? ?If you use a CPAP at night, you may bring your mask for your overnight stay. ?  ?Contacts, glasses, hearing aids, dentures or partials may not be worn into surgery, please bring cases for these belongings ?  ?For patients admitted to the hospital, discharge time will be determined by your treatment team. ?  ?Patients discharged the day of surgery will not be allowed to drive home, and someone needs to stay with them for 24 hours. ? ? ?SURGICAL WAITING ROOM VISITATION ?Patients having surgery or a procedure in a hospital may have two support people. ?Children under the age of 43 must have an adult with them who is not the patient. ?They may stay in the waiting area during the procedure and may switch out with other visitors. If the patient needs to stay at the hospital during part of their recovery, the visitor guidelines for inpatient rooms apply. ? ?Please refer to the [facility] website for the visitor guidelines for Inpatients (after your surgery is over and you are in a regular room).  ? ? ?Special instructions:   ? ?Oral Hygiene is also important to reduce your risk of infection.  Remember - BRUSH YOUR TEETH THE MORNING OF SURGERY WITH YOUR REGULAR TOOTHPASTE ? ? ?[facility]- Preparing For Surgery ? ?Before surgery, you can play an important role. Because skin is not sterile, your  skin needs to be as free of germs as possible. You can reduce the number of germs on your skin by washing with CHG (chlorahexidine gluconate) Soap before surgery.  CHG is an antiseptic cleaner which kills germs and bonds with the skin to continue killing germs even after washing.   ? ? ?Please do not use if you have an allergy to CHG or antibacterial soaps. If your skin becomes reddened/irritated stop using the CHG.  ?Do not shave (including legs and underarms) for at least 48 hours prior to first CHG shower. It  is OK to shave your face. ? ?Please follow these instructions carefully. ?  ? ? Shower the NIGHT BEFORE SURGERY and the MORNING OF SURGERY with CHG Soap.  ? If you chose to wash your hair, wash your hair first as usual with your normal shampoo. After you shampoo, rinse your hair and body thoroughly to remove the shampoo.  Then [employer] and genitals (private parts) with your normal soap and rinse thoroughly to remove soap. ? ?After that Use CHG Soap as you would any other liquid soap. You can apply CHG directly to the skin and wash gently with a scrungie or a clean washcloth.  ? ?Apply the CHG Soap to your body ONLY FROM THE NECK DOWN.  Do not use on open wounds or open sores. Avoid contact with your eyes, ears, mouth and genitals (private parts). Wash Face and genitals (private parts)  with your normal soap.  ? ?Wash thoroughly, paying special attention to the area where your surgery will be performed. ? ?Thoroughly rinse your body with warm water from the neck down. ? ?DO NOT shower/wash with your normal soap after using and rinsing off the CHG Soap. ? ?Pat yourself dry with a CLEAN TOWEL. ? ?Wear CLEAN PAJAMAS to bed the night before surgery ? ?Place CLEAN SHEETS on your bed the night before your surgery ? ?DO NOT SLEEP WITH PETS. ? ? ?Day of Surgery: ? ?Take a shower with CHG soap. ?Wear Clean/Comfortable clothing the morning of surgery ?Do not apply any deodorants/lotions.   ?Remember to brush your teeth WITH YOUR REGULAR TOOTHPASTE. ? ? ? ?If you received a COVID test during your pre-op visit  it is requested that you wear a mask when out in public, stay away from anyone that may not be feeling well and notify your surgeon if you develop symptoms. If you have been in contact with anyone that has tested positive in the last 10 days please notify you surgeon. ? ?  ?Please read over the following fact sheets that you were given.   ?

## 2021-06-10 NOTE — Progress Notes (Signed)
PCP:  Herbie Drape, MD ?Cardiologist: denies ? ?EKG:  06/10/21 ?CXR:  na ?ECHO:  denies ?Stress Test:  denies ?Cardiac Cath:  denies ? ?Fasting Blood Sugar- na ?Checks Blood Sugar_na__ times a day ? ?OSA: Yes ?CPAP: No ? ?ASA/Blood Thinner: No ? ?Covid test not needed ? ?Anesthesia Review: No ? ?Patient denies shortness of breath, fever, cough, and chest pain at PAT appointment. ? ?Patient verbalized understanding of instructions provided today at the PAT appointment.  Patient asked to review instructions at home and day of surgery.   ?

## 2021-06-24 NOTE — Anesthesia Preprocedure Evaluation (Addendum)
Anesthesia Evaluation  ?Patient identified by MRN, date of birth, ID band ?Patient awake ? ? ? ?Reviewed: ?Allergy & Precautions, NPO status , Patient's Chart, lab work & pertinent test results ? ?Airway ?Mallampati: II ? ?TM Distance: >3 FB ?Neck ROM: Full ? ? ? Dental ?no notable dental hx. ? ?  ?Pulmonary ?asthma , sleep apnea ,  ?  ?Pulmonary exam normal ? ? ? ? ? ? ? Cardiovascular ?hypertension, Pt. on medications ? ?Rhythm:Regular Rate:Normal ? ? ?  ?Neuro/Psych ? Headaches, Anxiety   ? GI/Hepatic ?Neg liver ROS, GERD  ,  ?Endo/Other  ?negative endocrine ROS ? Renal/GU ?negative Renal ROS  ?negative genitourinary ?  ?Musculoskeletal ? ?(+) Arthritis , Osteoarthritis,   ? Abdominal ?Normal abdominal exam  (+)   ?Peds ? Hematology ?negative hematology ROS ?(+)   ?Anesthesia Other Findings ? ? Reproductive/Obstetrics ? ?  ? ? ? ? ? ? ? ? ? ? ? ? ? ?  ?  ? ? ? ? ? ? ? ?Anesthesia Physical ?Anesthesia Plan ? ?ASA: 2 ? ?Anesthesia Plan: MAC, Regional and Spinal  ? ?Post-op Pain Management: Regional block*  ? ?Induction: Intravenous ? ?PONV Risk Score and Plan: 2 and Ondansetron, Dexamethasone, Propofol infusion, Treatment may vary due to age or medical condition and Midazolam ? ?Airway Management Planned: Simple Face Mask, Natural Airway and Nasal Cannula ? ?Additional Equipment: None ? ?Intra-op Plan:  ? ?Post-operative Plan:  ? ?Informed Consent: I have reviewed the patients History and Physical, chart, labs and discussed the procedure including the risks, benefits and alternatives for the proposed anesthesia with the patient or authorized representative who has indicated his/her understanding and acceptance.  ? ? ? ?Dental advisory given ? ?Plan Discussed with: CRNA ? ?Anesthesia Plan Comments: (Lab Results ?     Component                Value               Date                 ?     WBC                      5.8                 06/10/2021           ?     HGB                       14.2                06/10/2021           ?     HCT                      42.1                06/10/2021           ?     MCV                      88.3                06/10/2021           ?     PLT  277                 06/10/2021           ?Lab Results ?     Component                Value               Date                 ?     NA                       142                 06/10/2021           ?     K                        3.7                 06/10/2021           ?     CO2                      30                  06/10/2021           ?     GLUCOSE                  133 (H)             06/10/2021           ?     BUN                      10                  06/10/2021           ?     CREATININE               0.69                06/10/2021           ?     CALCIUM                  9.1                 06/10/2021           ?     EGFR                     83                  02/07/2021           ?     GFRNONAA                 >60                 06/10/2021          )  ? ? ? ? ? ?Anesthesia Quick Evaluation ? ?

## 2021-06-25 ENCOUNTER — Encounter (HOSPITAL_COMMUNITY)
Admission: RE | Disposition: A | Discharge status: Home / Self Care | Payer: Self-pay | Source: Home / Self Care | Attending: Orthopedic Surgery

## 2021-06-25 ENCOUNTER — Encounter (HOSPITAL_COMMUNITY): Payer: Self-pay | Admitting: Orthopedic Surgery

## 2021-06-25 ENCOUNTER — Ambulatory Visit (HOSPITAL_COMMUNITY): Payer: PRIVATE HEALTH INSURANCE | Admitting: Anesthesiology

## 2021-06-25 ENCOUNTER — Observation Stay (HOSPITAL_COMMUNITY)
Admission: RE | Admit: 2021-06-25 | Discharge: 2021-06-27 | Disposition: A | Discharge status: Home / Self Care | Payer: PRIVATE HEALTH INSURANCE | Attending: Orthopedic Surgery | Admitting: Orthopedic Surgery

## 2021-06-25 ENCOUNTER — Ambulatory Visit (HOSPITAL_BASED_OUTPATIENT_CLINIC_OR_DEPARTMENT_OTHER): Payer: PRIVATE HEALTH INSURANCE | Admitting: Anesthesiology

## 2021-06-25 ENCOUNTER — Other Ambulatory Visit: Payer: Self-pay

## 2021-06-25 DIAGNOSIS — Z96651 Presence of right artificial knee joint: Secondary | ICD-10-CM

## 2021-06-25 DIAGNOSIS — I10 Essential (primary) hypertension: Secondary | ICD-10-CM | POA: Insufficient documentation

## 2021-06-25 DIAGNOSIS — M1711 Unilateral primary osteoarthritis, right knee: Secondary | ICD-10-CM | POA: Diagnosis not present

## 2021-06-25 DIAGNOSIS — J45909 Unspecified asthma, uncomplicated: Secondary | ICD-10-CM | POA: Insufficient documentation

## 2021-06-25 HISTORY — PX: TOTAL KNEE ARTHROPLASTY: SHX125

## 2021-06-25 SURGERY — ARTHROPLASTY, KNEE, TOTAL
Anesthesia: Monitor Anesthesia Care | Site: Knee | Laterality: Right

## 2021-06-25 MED ORDER — CEFAZOLIN SODIUM-DEXTROSE 2-4 GM/100ML-% IV SOLN
2.0000 g | Freq: Four times a day (QID) | INTRAVENOUS | Status: AC
  Administered 2021-06-25 (×2): 2 g via INTRAVENOUS
  Filled 2021-06-25 (×2): qty 100

## 2021-06-25 MED ORDER — PHENOL 1.4 % MT LIQD: 1.0000 | OROMUCOSAL | Status: DC | PRN

## 2021-06-25 MED ORDER — ONDANSETRON HCL 4 MG/2ML IJ SOLN
INTRAMUSCULAR | Status: DC | PRN
  Administered 2021-06-25: 4 mg via INTRAVENOUS

## 2021-06-25 MED ORDER — MENTHOL 3 MG MT LOZG: 1.0000 | LOZENGE | OROMUCOSAL | Status: DC | PRN

## 2021-06-25 MED ORDER — LOSARTAN POTASSIUM-HCTZ 100-25 MG PO TABS: 1.0000 | ORAL_TABLET | Freq: Every day | ORAL | Status: DC

## 2021-06-25 MED ORDER — MIDAZOLAM HCL 2 MG/2ML IJ SOLN
INTRAMUSCULAR | Status: DC | PRN
  Administered 2021-06-25: 2 mg via INTRAVENOUS

## 2021-06-25 MED ORDER — FENTANYL CITRATE (PF) 250 MCG/5ML IJ SOLN
INTRAMUSCULAR | Status: DC | PRN
  Administered 2021-06-25: 50 ug via INTRAVENOUS

## 2021-06-25 MED ORDER — FENTANYL CITRATE (PF) 250 MCG/5ML IJ SOLN
INTRAMUSCULAR | Status: AC
  Filled 2021-06-25: qty 5

## 2021-06-25 MED ORDER — HYDROCHLOROTHIAZIDE 25 MG PO TABS
25.0000 mg | ORAL_TABLET | Freq: Every day | ORAL | Status: DC
  Administered 2021-06-25 – 2021-06-26 (×2): 25 mg via ORAL
  Filled 2021-06-25 (×2): qty 1

## 2021-06-25 MED ORDER — ACETAMINOPHEN 325 MG PO TABS: 325.0000 mg | ORAL_TABLET | Freq: Four times a day (QID) | ORAL | Status: DC | PRN

## 2021-06-25 MED ORDER — ROPIVACAINE HCL 7.5 MG/ML IJ SOLN
INTRAMUSCULAR | Status: DC | PRN
  Administered 2021-06-25: 20 mL via PERINEURAL

## 2021-06-25 MED ORDER — PHENYLEPHRINE 80 MCG/ML (10ML) SYRINGE FOR IV PUSH (FOR BLOOD PRESSURE SUPPORT)
PREFILLED_SYRINGE | INTRAVENOUS | Status: DC | PRN
  Administered 2021-06-25 (×5): 80 ug via INTRAVENOUS

## 2021-06-25 MED ORDER — TRANEXAMIC ACID 1000 MG/10ML IV SOLN
2000.0000 mg | INTRAVENOUS | Status: DC
  Filled 2021-06-25: qty 20

## 2021-06-25 MED ORDER — HYDROMORPHONE HCL 1 MG/ML IJ SOLN: 0.5000 mg | INTRAMUSCULAR | Status: DC | PRN

## 2021-06-25 MED ORDER — ORAL CARE MOUTH RINSE: 15.0000 mL | Freq: Once | OROMUCOSAL | Status: AC

## 2021-06-25 MED ORDER — 0.9 % SODIUM CHLORIDE (POUR BTL) OPTIME
TOPICAL | Status: DC | PRN
  Administered 2021-06-25: 1000 mL

## 2021-06-25 MED ORDER — SODIUM CHLORIDE 0.9 % IV SOLN: INTRAVENOUS | Status: DC

## 2021-06-25 MED ORDER — PHENYLEPHRINE HCL-NACL 20-0.9 MG/250ML-% IV SOLN
INTRAVENOUS | Status: DC | PRN
Start: 2021-06-25 — End: 2021-06-25
  Administered 2021-06-25: 40 ug/min via INTRAVENOUS

## 2021-06-25 MED ORDER — PREGABALIN 100 MG PO CAPS
100.0000 mg | ORAL_CAPSULE | Freq: Every day | ORAL | Status: DC
  Administered 2021-06-26 – 2021-06-27 (×2): 100 mg via ORAL
  Filled 2021-06-25 (×2): qty 1

## 2021-06-25 MED ORDER — OXYCODONE HCL 5 MG PO TABS
ORAL_TABLET | ORAL | Status: AC
  Filled 2021-06-25: qty 2

## 2021-06-25 MED ORDER — EZETIMIBE 10 MG PO TABS
10.0000 mg | ORAL_TABLET | Freq: Every day | ORAL | Status: DC
  Administered 2021-06-26 – 2021-06-27 (×2): 10 mg via ORAL
  Filled 2021-06-25 (×2): qty 1

## 2021-06-25 MED ORDER — LOSARTAN POTASSIUM 50 MG PO TABS
100.0000 mg | ORAL_TABLET | Freq: Every day | ORAL | Status: DC
  Administered 2021-06-25 – 2021-06-26 (×2): 100 mg via ORAL
  Filled 2021-06-25 (×2): qty 2

## 2021-06-25 MED ORDER — ASPIRIN EC 325 MG PO TBEC
325.0000 mg | DELAYED_RELEASE_TABLET | Freq: Every day | ORAL | Status: DC
  Administered 2021-06-26 – 2021-06-27 (×2): 325 mg via ORAL
  Filled 2021-06-25 (×2): qty 1

## 2021-06-25 MED ORDER — SODIUM CHLORIDE 0.9 % IV SOLN
INTRAVENOUS | Status: DC | PRN
  Administered 2021-06-25: 2000 mg via TOPICAL

## 2021-06-25 MED ORDER — POLYETHYLENE GLYCOL 3350 17 G PO PACK: 17.0000 g | PACK | Freq: Every day | ORAL | Status: DC | PRN

## 2021-06-25 MED ORDER — LACTATED RINGERS IV SOLN: INTRAVENOUS | Status: DC

## 2021-06-25 MED ORDER — ACETAMINOPHEN 10 MG/ML IV SOLN
1000.0000 mg | Freq: Once | INTRAVENOUS | Status: DC | PRN
  Administered 2021-06-25: 1000 mg via INTRAVENOUS

## 2021-06-25 MED ORDER — AMITRIPTYLINE HCL 50 MG PO TABS
25.0000 mg | ORAL_TABLET | Freq: Every day | ORAL | Status: DC
  Administered 2021-06-25 – 2021-06-26 (×2): 25 mg via ORAL
  Filled 2021-06-25 (×2): qty 1

## 2021-06-25 MED ORDER — BUDESONIDE 0.25 MG/2ML IN SUSP
0.2500 mg | Freq: Two times a day (BID) | RESPIRATORY_TRACT | Status: DC
Start: 2021-06-25 — End: 2021-06-27
  Administered 2021-06-26 – 2021-06-27 (×3): 0.25 mg via RESPIRATORY_TRACT
  Filled 2021-06-25 (×4): qty 2

## 2021-06-25 MED ORDER — TRANEXAMIC ACID-NACL 1000-0.7 MG/100ML-% IV SOLN
1000.0000 mg | INTRAVENOUS | Status: AC
  Administered 2021-06-25: 1000 mg via INTRAVENOUS
  Filled 2021-06-25: qty 100

## 2021-06-25 MED ORDER — ROSUVASTATIN CALCIUM 20 MG PO TABS
40.0000 mg | ORAL_TABLET | Freq: Every day | ORAL | Status: DC
  Administered 2021-06-26 – 2021-06-27 (×2): 40 mg via ORAL
  Filled 2021-06-25 (×2): qty 2

## 2021-06-25 MED ORDER — ONDANSETRON HCL 4 MG/2ML IJ SOLN: 4.0000 mg | Freq: Four times a day (QID) | INTRAMUSCULAR | Status: DC | PRN

## 2021-06-25 MED ORDER — PROPOFOL 500 MG/50ML IV EMUL
INTRAVENOUS | Status: DC | PRN
  Administered 2021-06-25: 50 ug/kg/min via INTRAVENOUS

## 2021-06-25 MED ORDER — MIDAZOLAM HCL 2 MG/2ML IJ SOLN
INTRAMUSCULAR | Status: AC
  Filled 2021-06-25: qty 2

## 2021-06-25 MED ORDER — PHENYLEPHRINE 80 MCG/ML (10ML) SYRINGE FOR IV PUSH (FOR BLOOD PRESSURE SUPPORT)
PREFILLED_SYRINGE | INTRAVENOUS | Status: AC
  Filled 2021-06-25: qty 10

## 2021-06-25 MED ORDER — OXYCODONE HCL 5 MG PO TABS
5.0000 mg | ORAL_TABLET | ORAL | Status: DC | PRN
  Administered 2021-06-25 – 2021-06-27 (×4): 10 mg via ORAL
  Filled 2021-06-25 (×3): qty 2

## 2021-06-25 MED ORDER — PROPOFOL 10 MG/ML IV BOLUS
INTRAVENOUS | Status: AC
  Filled 2021-06-25: qty 20

## 2021-06-25 MED ORDER — BISACODYL 10 MG RE SUPP: 10.0000 mg | Freq: Every day | RECTAL | Status: DC | PRN

## 2021-06-25 MED ORDER — METHOCARBAMOL 500 MG PO TABS
500.0000 mg | ORAL_TABLET | Freq: Four times a day (QID) | ORAL | Status: DC | PRN
  Administered 2021-06-25 – 2021-06-26 (×2): 500 mg via ORAL
  Filled 2021-06-25 (×2): qty 1

## 2021-06-25 MED ORDER — CHLORHEXIDINE GLUCONATE 0.12 % MT SOLN
15.0000 mL | Freq: Once | OROMUCOSAL | Status: AC
  Administered 2021-06-25: 15 mL via OROMUCOSAL
  Filled 2021-06-25: qty 15

## 2021-06-25 MED ORDER — DOCUSATE SODIUM 100 MG PO CAPS
100.0000 mg | ORAL_CAPSULE | Freq: Two times a day (BID) | ORAL | Status: DC
  Administered 2021-06-25 – 2021-06-27 (×5): 100 mg via ORAL
  Filled 2021-06-25 (×5): qty 1

## 2021-06-25 MED ORDER — ACETAMINOPHEN 10 MG/ML IV SOLN
INTRAVENOUS | Status: AC
Start: 2021-06-25 — End: 2021-06-26
  Filled 2021-06-25: qty 100

## 2021-06-25 MED ORDER — CEFAZOLIN SODIUM-DEXTROSE 2-4 GM/100ML-% IV SOLN
2.0000 g | INTRAVENOUS | Status: AC
  Administered 2021-06-25: 2 g via INTRAVENOUS
  Filled 2021-06-25: qty 100

## 2021-06-25 MED ORDER — SODIUM CHLORIDE 0.9 % IR SOLN
Status: DC | PRN
Start: 2021-06-25 — End: 2021-06-25
  Administered 2021-06-25: 3000 mL

## 2021-06-25 MED ORDER — BUPIVACAINE IN DEXTROSE 0.75-8.25 % IT SOLN
INTRATHECAL | Status: DC | PRN
  Administered 2021-06-25: 1.8 mL via INTRATHECAL

## 2021-06-25 MED ORDER — METOCLOPRAMIDE HCL 5 MG PO TABS: 5.0000 mg | ORAL_TABLET | Freq: Three times a day (TID) | ORAL | Status: DC | PRN

## 2021-06-25 MED ORDER — HYDROMORPHONE HCL 2 MG PO TABS: 2.0000 mg | ORAL_TABLET | ORAL | Status: DC | PRN

## 2021-06-25 MED ORDER — METOCLOPRAMIDE HCL 5 MG/ML IJ SOLN: 5.0000 mg | Freq: Three times a day (TID) | INTRAMUSCULAR | Status: DC | PRN

## 2021-06-25 MED ORDER — ONDANSETRON HCL 4 MG PO TABS: 4.0000 mg | ORAL_TABLET | Freq: Four times a day (QID) | ORAL | Status: DC | PRN

## 2021-06-25 MED ORDER — METHOCARBAMOL 1000 MG/10ML IJ SOLN
500.0000 mg | Freq: Four times a day (QID) | INTRAVENOUS | Status: DC | PRN
  Filled 2021-06-25: qty 5

## 2021-06-25 MED ORDER — DIPHENHYDRAMINE HCL 12.5 MG/5ML PO ELIX: 12.5000 mg | ORAL_SOLUTION | ORAL | Status: DC | PRN

## 2021-06-25 MED ORDER — FENTANYL CITRATE (PF) 100 MCG/2ML IJ SOLN: 25.0000 ug | INTRAMUSCULAR | Status: DC | PRN

## 2021-06-25 MED ORDER — ONDANSETRON HCL 4 MG/2ML IJ SOLN
INTRAMUSCULAR | Status: AC
  Filled 2021-06-25: qty 2

## 2021-06-25 SURGICAL SUPPLY — 55 items
BAG COUNTER SPONGE SURGICOUNT (BAG) ×2 IMPLANT
BAG DECANTER FOR FLEXI CONT (MISCELLANEOUS) ×1 IMPLANT
BLADE SAGITTAL 25.0X1.19X90 (BLADE) ×2 IMPLANT
BLADE SAW SGTL 13X75X1.27 (BLADE) ×2 IMPLANT
BLADE SURG 21 STRL SS (BLADE) ×3 IMPLANT
BNDG COHESIVE 6X5 TAN ST LF (GAUZE/BANDAGES/DRESSINGS) ×1 IMPLANT
BNDG COHESIVE 6X5 TAN STRL LF (GAUZE/BANDAGES/DRESSINGS) ×3 IMPLANT
BNDG GAUZE ELAST 4 BULKY (GAUZE/BANDAGES/DRESSINGS) ×2 IMPLANT
BOWL SMART MIX CTS (DISPOSABLE) ×1 IMPLANT
COMP PATELLAR 10X35 METAL (Joint) ×2 IMPLANT
COMPONENT FEM KNEE SZ 9 (Knees) ×1 IMPLANT
COMPONENT PATELLAR 10X35 METAL (Joint) IMPLANT
COOLER ICEMAN CLASSIC (MISCELLANEOUS) ×2 IMPLANT
COVER SURGICAL LIGHT HANDLE (MISCELLANEOUS) ×2 IMPLANT
CUFF TOURN SGL QUICK 34 (TOURNIQUET CUFF) ×1
CUFF TRNQT CYL 34X4.125X (TOURNIQUET CUFF) ×1 IMPLANT
DRAPE HALF SHEET 40X57 (DRAPES) ×3 IMPLANT
DRAPE U-SHAPE 47X51 STRL (DRAPES) ×2 IMPLANT
DRSG ADAPTIC 3X8 NADH LF (GAUZE/BANDAGES/DRESSINGS) ×2 IMPLANT
DRSG PAD ABDOMINAL 8X10 ST (GAUZE/BANDAGES/DRESSINGS) ×2 IMPLANT
DURAPREP 26ML APPLICATOR (WOUND CARE) ×2 IMPLANT
ELECT REM PT RETURN 9FT ADLT (ELECTROSURGICAL) ×2
ELECTRODE REM PT RTRN 9FT ADLT (ELECTROSURGICAL) ×1 IMPLANT
GAUZE SPONGE 4X4 12PLY STRL (GAUZE/BANDAGES/DRESSINGS) ×2 IMPLANT
GLOVE BIOGEL PI IND STRL 9 (GLOVE) ×1 IMPLANT
GLOVE BIOGEL PI INDICATOR 9 (GLOVE) ×1
GLOVE SURG ORTHO 9.0 STRL STRW (GLOVE) ×3 IMPLANT
GOWN STRL REUS W/ TWL XL LVL3 (GOWN DISPOSABLE) ×2 IMPLANT
GOWN STRL REUS W/TWL XL LVL3 (GOWN DISPOSABLE) ×3
HANDPIECE INTERPULSE COAX TIP (DISPOSABLE)
HDLS TROCR DRIL PIN KNEE 75 (PIN) ×4
INSERT TIBIA KNEE RIGHT 10 (Joint) ×1 IMPLANT
KIT BASIN OR (CUSTOM PROCEDURE TRAY) ×2 IMPLANT
KIT TURNOVER KIT B (KITS) ×2 IMPLANT
MANIFOLD NEPTUNE II (INSTRUMENTS) ×2 IMPLANT
NS IRRIG 1000ML POUR BTL (IV SOLUTION) ×2 IMPLANT
PACK TOTAL JOINT (CUSTOM PROCEDURE TRAY) ×2 IMPLANT
PAD ARMBOARD 7.5X6 YLW CONV (MISCELLANEOUS) ×2 IMPLANT
PAD COLD SHLDR WRAP-ON (PAD) ×2 IMPLANT
PENCIL BUTTON HOLSTER BLD 10FT (ELECTRODE) ×1 IMPLANT
PIN DRILL HDLS TROCAR 75 4PK (PIN) IMPLANT
PULSAVAC PLUS IRRIG FAN TIP (DISPOSABLE) ×2
SCREW FEMALE HEX FIX 25X2.5 (ORTHOPEDIC DISPOSABLE SUPPLIES) ×1 IMPLANT
SET HNDPC FAN SPRY TIP SCT (DISPOSABLE) ×1 IMPLANT
SPONGE T-LAP 18X18 ~~LOC~~+RFID (SPONGE) ×3 IMPLANT
STAPLER VISISTAT 35W (STAPLE) ×2 IMPLANT
STEM TIBIAL TRAB SZE RT (Stem) ×1 IMPLANT
SUT VIC AB 0 CT1 27 (SUTURE) ×2
SUT VIC AB 0 CT1 27XBRD ANBCTR (SUTURE) ×1 IMPLANT
SUT VIC AB 1 CTX 36 (SUTURE) ×2
SUT VIC AB 1 CTX36XBRD ANBCTR (SUTURE) IMPLANT
SUT VIC AB 1 CTX36XBRD ANBCTRL (SUTURE) IMPLANT
TIP FAN IRRIG PULSAVAC PLUS (DISPOSABLE) IMPLANT
TOWEL GREEN STERILE (TOWEL DISPOSABLE) ×2 IMPLANT
TOWEL GREEN STERILE FF (TOWEL DISPOSABLE) ×2 IMPLANT

## 2021-06-25 NOTE — Anesthesia Procedure Notes (Signed)
Procedure Name: [place] ?Date/Time: 06/25/2021 8:28 AM ?Performed by: Dorann Lodge, CRNA ?Pre-anesthesia Checklist: Patient identified, Emergency Drugs available, Suction available and Patient being monitored ?Patient Re-evaluated:Patient Re-evaluated prior to induction ?Oxygen Delivery Method: Simple face mask ? ? ? ? ?

## 2021-06-25 NOTE — Discharge Instructions (Signed)
INSTRUCTIONS AFTER JOINT REPLACEMENT  ? ?Remove items at home which could result in a fall. This includes throw rugs or furniture in walking pathways ?ICE to the affected joint every three hours while awake for 30 minutes at a time, for at least the first 3-5 days, and then as needed for pain and swelling.  Continue to use ice for pain and swelling. You may notice swelling that will progress down to the foot and ankle.  This is normal after surgery.  Elevate your leg when you are not up walking on it.   ?Continue to use the breathing machine you got in the hospital (incentive spirometer) which will help keep your temperature down.  It is common for your temperature to cycle up and down following surgery, especially at night when you are not up moving around and exerting yourself.  The breathing machine keeps your lungs expanded and your temperature down. ? ? ?DIET:  As you were doing prior to hospitalization, we recommend a well-balanced diet. ? ?DRESSING / WOUND CARE / SHOWERING ? ?You may change your dressing 3-5 days after surgery.  Then change the dressing every day with sterile gauze.  Please use good hand washing techniques before changing the dressing.  Do not use any lotions or creams on the incision until instructed by your surgeon. ? ?ACTIVITY ? ?Increase activity slowly as tolerated, but follow the weight bearing instructions below.   ?No driving for 6 weeks or until further direction given by your physician.  You cannot drive while taking narcotics.  ?No lifting or carrying greater than 10 lbs. until further directed by your surgeon. ?Avoid periods of inactivity such as sitting longer than an hour when not asleep. This helps prevent blood clots.  ?You may return to work once you are authorized by your doctor.  ? ? ? ?WEIGHT BEARING  ? ?Weight bearing as tolerated with assist device (walker, cane, etc) as directed, use it as long as suggested by your surgeon or therapist, typically at least 4-6  weeks. ? ? ?EXERCISES ? ?Results after joint replacement surgery are often greatly improved when you follow the exercise, range of motion and muscle strengthening exercises prescribed by your doctor. Safety measures are also important to protect the joint from further injury. Any time any of these exercises cause you to have increased pain or swelling, decrease what you are doing until you are comfortable again and then slowly increase them. If you have problems or questions, call your caregiver or physical therapist for advice.  ? ?Rehabilitation is important following a joint replacement. After just a few days of immobilization, the muscles of the leg can become weakened and shrink (atrophy).  These exercises are designed to build up the tone and strength of the thigh and leg muscles and to improve motion. Often times heat used for twenty to thirty minutes before working out will loosen up your tissues and help with improving the range of motion but do not use heat for the first two weeks following surgery (sometimes heat can increase post-operative swelling).  ? ?These exercises can be done on a training (exercise) mat, on the floor, on a table or on a bed. Use whatever works the best and is most comfortable for you.    Use music or television while you are exercising so that the exercises are a pleasant break in your day. This will make your life better with the exercises acting as a break in your routine that you can look forward   to.   Perform all exercises about fifteen times, three times per day or as directed.  You should exercise both the operative leg and the other leg as well. ? ?Exercises include: ?  ?Quad Sets - Tighten up the muscle on the front of the thigh (Quad) and hold for 5-10 seconds.   ?Straight Leg Raises - With your knee straight (if you were given a brace, keep it on), lift the leg to 60 degrees, hold for 3 seconds, and slowly lower the leg.  Perform this exercise against resistance later as  your leg gets stronger.  ?Leg Slides: Lying on your back, slowly slide your foot toward your buttocks, bending your knee up off the floor (only go as far as is comfortable). Then slowly slide your foot back down until your leg is flat on the floor again.  ?Angel Wings: Lying on your back spread your legs to the side as far apart as you can without causing discomfort.  ?Hamstring Strength:  Lying on your back, push your heel against the floor with your leg straight by tightening up the muscles of your buttocks.  Repeat, but this time bend your knee to a comfortable angle, and push your heel against the floor.  You may put a pillow under the heel to make it more comfortable if necessary.  ? ?A rehabilitation program following joint replacement surgery can speed recovery and prevent re-injury in the future due to weakened muscles. Contact your doctor or a physical therapist for more information on knee rehabilitation.  ? ? ?CONSTIPATION ? ?Constipation is defined medically as fewer than three stools per week and severe constipation as less than one stool per week.  Even if you have a regular bowel pattern at home, your normal regimen is likely to be disrupted due to multiple reasons following surgery.  Combination of anesthesia, postoperative narcotics, change in appetite and fluid intake all can affect your bowels.  ? ?YOU MUST use at least one of the following options; they are listed in order of increasing strength to get the job done.  They are all available over the counter, and you may need to use some, POSSIBLY even all of these options:   ? ?Drink plenty of fluids (prune juice may be helpful) and high fiber foods ?Colace 100 mg by mouth twice a day  ?Senokot for constipation as directed and as needed Dulcolax (bisacodyl), take with full glass of water  ?Miralax (polyethylene glycol) once or twice a day as needed. ? ?If you have tried all these things and are unable to have a bowel movement in the first 3-4 days  after surgery call either your surgeon or your primary doctor.   ? ?If you experience loose stools or diarrhea, hold the medications until you stool forms back up.  If your symptoms do not get better within 1 week or if they get worse, check with your doctor.  If you experience "the worst abdominal pain ever" or develop nausea or vomiting, please contact the office immediately for further recommendations for treatment. ? ? ?ITCHING:  If you experience itching with your medications, try taking only a single pain pill, or even half a pain pill at a time.  You can also use Benadryl over the counter for itching or also to help with sleep.  ? ?TED HOSE STOCKINGS:  Use stockings on both legs until for at least 2 weeks or as directed by physician office. They may be removed at night for sleeping. ? ?  MEDICATIONS:  See your medication summary on the ?After Visit Summary? that nursing will review with you.  You may have some home medications which will be placed on hold until you complete the course of blood thinner medication.  It is important for you to complete the blood thinner medication as prescribed. ? ?PRECAUTIONS:  If you experience chest pain or shortness of breath - call 911 immediately for transfer to the hospital emergency department.  ? ?If you develop a fever greater that 101 F, purulent drainage from wound, increased redness or drainage from wound, foul odor from the wound/dressing, or calf pain - CONTACT YOUR SURGEON.   ?                                                ?FOLLOW-UP APPOINTMENTS:  If you do not already have a post-op appointment, please call the office for an appointment to be seen by your surgeon.  Guidelines for how soon to be seen are listed in your ?After Visit Summary?, but are typically between 1-4 weeks after surgery. ? ?OTHER INSTRUCTIONS:  ? ?Knee Replacement:  Do not place pillow under knee, focus on keeping the knee straight while resting. CPM instructions: 0-90 degrees, 2 hours in the  morning, 2 hours in the afternoon, and 2 hours in the evening. Place foam block, curve side up under heel at all times except when in CPM or when walking.  DO NOT modify, tear, cut, or change the foam block

## 2021-06-25 NOTE — Evaluation (Signed)
Physical Therapy Evaluation ?Patient Details ?Name: Jessica Huff ?MRN: [id number] ?DOB: Apr 27, 1960 ?Today's Date: 06/25/2021 ? ?History of Present Illness ? Jessica Huff is a 61 y.o. female presenting 06/25/21 s/p R TKA. PMH includes lumbar fusion (2020), R ankle surgery (2018), BPPV, HTN, GERD, and R lateral meniscus tear (03/19/21).  ?Clinical Impression ? Pt presents s/p R TKA. Pt impairments include decreased cardiopulmonary conditioning, range of motion, strength, balance, and pain modulation. Pt min to mod A for LE management with bed mobility. Pt experiencing progressive dizziness and returned to supine. BP once supine 106/70s-80smmHg. Pt had grayscale vision and difficulty breathing that resolved once supine with HOB flat. SpO2 98%. RN aware. Pt educated on ankle pumps (20/hour) to perform this afternoon/evening to increase circulation, to pt's tolerance. Plan to check orthostatics prior to mobility next session. PT to progress mobility as tolerated, and will continue to follow acutely.  ?  ? ?   ?   ? ?Recommendations for follow up therapy are one component of a multi-disciplinary discharge planning process, led by the attending physician.  Recommendations may be updated based on patient status, additional functional criteria and insurance authorization. ? ?Follow Up Recommendations Follow physician's recommendations for discharge plan and follow up therapies ? ?  ?Assistance Recommended at Discharge Intermittent Supervision/Assistance  ?Patient can return home with the following ? Assistance with cooking/housework;Assist for transportation;Help with stairs or ramp for entrance ? ?  ?Equipment Recommendations Rolling walker (2 wheels);BSC/3in1  ?Recommendations for Other Services ?    ?  ?Functional Status Assessment Patient has had a recent decline in their functional status and demonstrates the ability to make significant improvements in function in a reasonable and predictable amount of time.  ? ?  ?Precautions /  Restrictions Precautions ?Precautions: Fall;Knee ?Precaution Booklet Issued: Yes (comment) ?Precaution Comments: soft BP. did not verbally review knee precautions this session due to pt symptoms ?Restrictions ?Weight Bearing Restrictions: Yes ?RLE Weight Bearing: Weight bearing as tolerated  ? ?  ? ?Mobility ? Bed Mobility ?Overal bed mobility: Needs Assistance ?Bed Mobility: Supine to Sit, Sit to Supine ?  ?  ?Supine to sit: Min assist ?Sit to supine: Mod assist ?  ?General bed mobility comments: pt min A for LE management. Pt mod A for LE management on return to supine. Once seated, pt experienced dizziness. Attempted to take BP in sitting, however, BP cuff not reading. With progressive dizziness and decreased patient responsiveness, pt returned to supine. Pt began experiencing wheezing and gasping for breath. Pt sat in long sitting in bed to catch her breath. Sats at 98%. RR did decrease to 10 and pt stating her vision was gray. Pt HOB lowered until flat with improve pt response, vision returning to normal and increased RR. BP supine 106/70s-80smmHg. Further mobility deferred for pt safety. ?  ? ?Transfers ?  ?  ?  ?  ?  ?  ?  ?  ?  ?  ?  ? ?Ambulation/Gait ?  ?  ?  ?  ?  ?  ?  ?  ? ?Stairs ?  ?  ?  ?  ?  ? ?Wheelchair Mobility ?  ? ?Modified Rankin (Stroke Patients Only) ?  ? ?  ? ?Balance Overall balance assessment: Needs assistance ?Sitting-balance support: Single extremity supported, Feet supported ?Sitting balance-Leahy Scale: Fair ?Sitting balance - Comments: pt reliant on single UE support in sitting with supervision for safety ?  ?  ?  ?  ?  ?  ?  ?  ?  ?  ?  ?  ?  ?  ?  ?   ? ? ? ?  Pertinent Vitals/Pain Pain Assessment ?Pain Assessment: 0-10 ?Pain Score: 3  ?Pain Location: R knee ?Pain Descriptors / Indicators: Discomfort, Aching ?Pain Intervention(s): Monitored during session  ? ? ?Home Living Family/patient expects to be discharged to:: Private residence ?Living Arrangements: Children ?Available Help at  Discharge: Family;Available PRN/intermittently ?Type of Home: House ?Home Access: Level entry ?  ?  ?Alternate Level Stairs-Number of Steps: 10 ?Home Layout: Two level;Bed/bath upstairs ?Home Equipment: Gilmer MorCane - single point;Shower seat ?Additional Comments: Pt has a bathroom on main level if she needs to stay on main level upon D/C.  ?  ?Prior Function Prior Level of Function : Independent/Modified Independent ?  ?  ?  ?  ?  ?  ?Mobility Comments: using cane for ambulation ?ADLs Comments: able to bathe, dress, and feed herself independently prior to TKA. ?  ? ? ?Hand Dominance  ? Dominant Hand: Right ? ?  ?Extremity/Trunk Assessment  ? Upper Extremity Assessment ?Upper Extremity Assessment: Overall WFL for tasks assessed ?  ? ?Lower Extremity Assessment ?Lower Extremity Assessment: RLE deficits/detail ?RLE Deficits / Details: consistent with s/p R TKA. ?  ? ?Cervical / Trunk Assessment ?Cervical / Trunk Assessment: Normal  ?Communication  ? Communication: No difficulties  ?Cognition Arousal/Alertness: Awake/alert ?Behavior During Therapy: [facility] for tasks assessed/performed, Anxious ?Overall Cognitive Status: Within Functional Limits for tasks assessed ?  ?  ?  ?  ?  ?  ?  ?  ?  ?  ?  ?  ?  ?  ?  ?  ?  ?  ?  ? ?  ?General Comments   ? ?  ?Exercises    ? ?Assessment/Plan  ?  ?PT Assessment Patient needs continued PT services  ?PT Problem List Decreased strength;Decreased range of motion;Decreased activity tolerance;Decreased balance;Decreased mobility;Decreased knowledge of use of DME;Cardiopulmonary status limiting activity;Pain ? ?   ?  ?PT Treatment Interventions DME instruction;Gait training;Stair training;Functional mobility training;Therapeutic activities;Therapeutic exercise;Balance training;Patient/family education   ? ?PT Goals (Current goals can be found in the Care Plan section)  ?Acute Rehab PT Goals ?Patient Stated Goal: to return home ?PT Goal Formulation: With patient ?Time For Goal Achievement:  07/02/21 ?Potential to Achieve Goals: Good ? ?  ?Frequency 7X/week ?  ? ? ?Co-evaluation   ?  ?  ?  ?  ? ? ?  ?AM-PAC PT "6 Clicks" Mobility  ?Outcome Measure Help needed turning from your back to your side while in a flat bed without using bedrails?: None ?Help needed moving from lying on your back to sitting on the side of a flat bed without using bedrails?: A Little ?Help needed moving to and from a bed to a chair (including a wheelchair)?: A Little ?Help needed standing up from a chair using your arms (e.g., wheelchair or bedside chair)?: A Lot ?Help needed to walk in hospital room?: A Lot ?Help needed climbing 3-5 steps with a railing? : A Lot ?6 Click Score: 16 ? ?  ?End of Session   ?Activity Tolerance: Treatment limited secondary to medical complications (Comment);Other (comment) (soft BP) ?Patient left: in bed;with call bell/phone within reach;with bed alarm set;with nursing/sitter in room ?Nurse Communication: Mobility status ?PT Visit Diagnosis: Other abnormalities of gait and mobility (R26.89);Muscle weakness (generalized) (M62.81);Difficulty in walking, not elsewhere classified (R26.2);Dizziness and giddiness (R42);Pain ?Pain - Right/Left: Right ?Pain - part of body: Knee ?  ? ?Time: [id number] ?PT Time Calculation (min) (ACUTE ONLY): 36 min ? ? ?Charges:   PT Evaluation ?$PT Eval Moderate Complexity: 1  Mod ?PT Treatments ?$Therapeutic Activity: 8-22 mins ?  ?   ? ? ?Enis Slipper, SPT ? ?Enis Slipper ?06/25/2021, 4:33 PM ? ?

## 2021-06-25 NOTE — Anesthesia Postprocedure Evaluation (Signed)
Anesthesia Post Note ? ?Patient: Jessica Huff ? ?Procedure(s) Performed: RIGHT TOTAL KNEE ARTHROPLASTY (Right: Knee) ? ?  ? ?Patient location during evaluation: PACU ?Anesthesia Type: Regional, MAC and Spinal ?Level of consciousness: awake and alert ?Pain management: pain level controlled ?Vital Signs Assessment: post-procedure vital signs reviewed and stable ?Respiratory status: spontaneous breathing, nonlabored ventilation, respiratory function stable and patient connected to nasal cannula oxygen ?Cardiovascular status: stable and blood pressure returned to baseline ?Postop Assessment: no apparent nausea or vomiting ?Anesthetic complications: no ? ? ?No notable events documented. ? ?Last Vitals:  ?Vitals:  ? 06/25/21 1155 06/25/21 1309  ?BP: (!) 146/65 125/78  ?Pulse: 67 65  ?Resp: 14 17  ?Temp: (!) 36.4 ?C 36.7 ?C  ?SpO2: 97% 100%  ?  ?Last Pain:  ?Vitals:  ? 06/25/21 1309  ?TempSrc: Oral  ?PainSc:   ? ? ?  ?  ?  ?  ?  ?  ? ?Jessica Huff ? ? ? ? ?

## 2021-06-25 NOTE — Anesthesia Procedure Notes (Signed)
Anesthesia Regional Block: Adductor canal block  ? ?Pre-Anesthetic Checklist: , timeout performed,  Correct Patient, Correct Site, Correct Laterality,  Correct Procedure, Correct Position, site marked,  Risks and benefits discussed,  Surgical consent,  Pre-op evaluation,  At surgeon's request and post-op pain management ? ?Laterality: Right ? ?Prep: Dura Prep     ?  ?Needles:  ?Injection technique: Single-shot ? ?Needle Type: Echogenic Stimulator Needle   ? ? ?Needle Length: 5cm  ?Needle Gauge: 20  ? ? ? ?Additional Needles: ? ? ?Procedures:,,,, ultrasound used (permanent image in chart),,    ?Narrative:  ?Start time: 06/25/2021 7:58 AM ?End time: 06/25/2021 8:02 AM ?Injection made incrementally with aspirations every 5 mL. ? ?Performed by: Personally  ?Anesthesiologist: Darral Dash, DO ? ?Additional Notes: ?Patient identified. Risks/Benefits/Options discussed with patient including but not limited to bleeding, infection, nerve damage, failed block, incomplete pain control. Patient expressed understanding and wished to proceed. All questions were answered. Sterile technique was used throughout the entire procedure. Please see nursing notes for vital signs. Aspirated in 5cc intervals with injection for negative confirmation. Patient was given instructions on fall risk and not to get out of bed. All questions and concerns addressed with instructions to call with any issues or inadequate analgesia.   ?  ? ? ? ? ?

## 2021-06-25 NOTE — Plan of Care (Signed)

## 2021-06-25 NOTE — Anesthesia Procedure Notes (Signed)
Spinal ? ?Patient location during procedure: OR ?Start time: 06/25/2021 8:31 AM ?End time: 06/25/2021 8:33 AM ?Staffing ?Performed: anesthesiologist  ?Anesthesiologist: Atilano Median, DO ?Preanesthetic Checklist ?Completed: patient identified, IV checked, site marked, risks and benefits discussed, surgical consent, monitors and equipment checked, pre-op evaluation and timeout performed ?Spinal Block ?Patient position: sitting ?Prep: DuraPrep ?Patient monitoring: heart rate, cardiac monitor, continuous pulse ox and blood pressure ?Approach: midline ?Location: L3-4 ?Injection technique: single-shot ?Needle ?Needle type: Pencan  ?Needle gauge: 24 G ?Needle length: 10 cm ?Assessment ?Events: CSF return ?Additional Notes ?Patient identified. Risks/Benefits/Options discussed with patient including but not limited to bleeding, infection, nerve damage, paralysis, failed block, incomplete pain control, headache, blood pressure changes, nausea, vomiting, reactions to medications, itching and postpartum back pain. Confirmed with bedside nurse the patient's most recent platelet count. Confirmed with patient that they are not currently taking any anticoagulation, have any bleeding history or any family history of bleeding disorders. Patient expressed understanding and wished to proceed. All questions were answered. Sterile technique was used throughout the entire procedure. Please see nursing notes for vital signs. Warning signs of high block given to the patient including shortness of breath, tingling/numbness in hands, complete motor block, or any concerning symptoms with instructions to call for help. Patient was given instructions on fall risk and not to get out of bed. All questions and concerns addressed with instructions to call with any issues or inadequate analgesia.   ?  ? ? ? ?

## 2021-06-25 NOTE — Op Note (Signed)
DATE OF SURGERY:  06/25/2021 ? ?TIME: 10:01 AM ? ?PATIENT NAME:  Jessica Huff   ? ?AGE: 61 y.o.  ? ? ?PRE-OPERATIVE DIAGNOSIS:  Osteoarthritis Right Knee ? ?POST-OPERATIVE DIAGNOSIS:  Osteoarthritis Right Knee ? ?PROCEDURE:  Procedure(s): ?RIGHT TOTAL KNEE ARTHROPLASTY ? ?SURGEON: Aldean Baker ? ?ASSISTANT: Shela Nevin, April Green ? ?OPERATIVE IMPLANTS: Zimmer Persona  Femur size 9, Tibia size E  2- Peg fixed bearing, Patella size 35 1-peg oval button, with a 10 mm polyethylene insert medial congruent. ? ?@ENCIMAGES @ ? ? ? ?PREOPERATIVE INDICATIONS: ? ? ?Jessica Huff is a 61 y.o. year old female with end stage degenerative arthritis of the knee who failed conservative treatment and elected for Total Knee Arthroplasty.  ? ?The risks, benefits, and alternatives were discussed at length including but not limited to the risks of infection, bleeding, nerve injury, stiffness, blood clots, the need for revision surgery, cardiopulmonary complications, among others, and they were willing to proceed. ? ?OPERATIVE DESCRIPTION: ? ?The patient was brought to the operative room and placed in a supine position.  Anesthesia was administered.  IV antibiotics were given.  IV TXA was initiated.  The lower extremity was prepped and draped in the usual sterile fashion.  77 was used to cover all exposed skin. Time out was performed.   ? ?Anterior quadriceps tendon splitting approach was performed.  The patella was everted and osteophytes were removed.  The anterior horn of the medial and lateral meniscus was removed.  ? ?The distal femur was opened with the drill and the intramedullary distal femoral cutting jig was utilized, set at 5 degrees valgus resecting 9 mm off the distal femur.  Care was taken to protect the collateral ligaments. ? ?Then the extramedullary tibial cutting jig was utilized set for 3 degree posterior slope.  Care was taken during the cut to protect the medial and collateral ligaments.  The proximal tibia was  removed along with the posterior horns of the menisci.  The PCL was sacrificed.   ? ?The extensor gap was measured and was approximately 10 mm.   ? ?The distal femoral sizing jig was applied, taking care to avoid notching.  Then the 4-in-1 cutting jig was applied and the anterior and posterior femur was cut, along with the chamfer cuts.  All posterior osteophytes were removed.  The flexion gap was then measured and was symmetric with the extension gap. ? ?The distal femoral preparation using the appropriate jig to prepare the box. ? ?The patella was then measured, and cut with the saw.   ? ?The proximal tibia sized and prepared accordingly with the reamer and the punch, and then all components were trialed with the poly insert.  The knee was found to have stable balance and full motion.  The knee was irrigated with normal saline, topical 2 g TXA was used to soak the wound. ? ?The above named components were then press fit into place.  The final polyethylene component was placed. ? ?The knee was then taken through a range of motion and the patella tracked well and the knee irrigated copiously and the parapatellar and subcutaneous tissue closed with vicryl, and skin closed with staples..  A sterile dressing was applied and patient  was taken to the PACU in stable  condition.  There were no complications. ? ?Total tourniquet time was 10 minutes. ?  ?

## 2021-06-25 NOTE — H&P (Signed)
TOTAL KNEE ADMISSION H&P ? ?Patient is being admitted for right total knee arthroplasty. ? ?Subjective: ? ?Chief Complaint:right knee pain. ? ?HPI: Jessica Huff, 61 y.o. female, has a history of pain and functional disability in the right knee due to arthritis and has failed non-surgical conservative treatments for greater than 12 weeks to includeNSAID's and/or analgesics, corticosteriod injections, use of assistive devices, weight reduction as appropriate, and activity modification.  Onset of symptoms was gradual, starting 8 years ago with gradually worsening course since that time. The patient noted no past surgery on the right knee(s).  Patient currently rates pain in the right knee(s) at 8 out of 10 with activity. Patient has night pain, worsening of pain with activity and weight bearing, pain that interferes with activities of daily living, pain with passive range of motion, crepitus, and joint swelling.  Patient has evidence of subchondral cysts, subchondral sclerosis, periarticular osteophytes, joint subluxation, and joint space narrowing by imaging studies. This patient has had avascular necrosis of the knee. There is no active infection. ? ?Patient Active Problem List  ? Diagnosis Date Noted  ? Tear of lateral meniscus of right knee 03/19/2021  ? Primary osteoarthritis of right knee 03/19/2021  ? Acute pain of right knee 02/07/2021  ? Malaise and fatigue 02/07/2021  ? Prediabetes 10/19/2018  ? S/P lumbar fusion 09/16/2018  ? Asthma in adult, moderate persistent, uncomplicated [id number]  ? Chronic pain of right ankle 03/02/2016  ? Allergic rhinitis 11/29/2015  ? Class 1 obesity due to excess calories with serious comorbidity and body mass index (BMI) of 32.0 to 32.9 in adult 11/29/2015  ? Benign positional vertigo 04/07/2015  ? Chronic bilateral low back pain with sciatica 04/07/2015  ? Common migraine 04/07/2015  ? Hyperlipidemia 04/07/2015  ? Essential hypertension 04/07/2015  ? Insomnia 04/07/2015  ?  Obstructive sleep apnea 04/07/2015  ? Lumbar radiculopathy 04/07/2015  ? Anxiety 04/07/2015  ? ?Past Medical History:  ?Diagnosis Date  ? Asthma   ? GERD (gastroesophageal reflux disease)   ? Hyperlipidemia   ? Hypertension   ?  ?Past Surgical History:  ?Procedure Laterality Date  ? ANKLE SURGERY Right 2018  ? LUMBAR FUSION  2020  ? L4-L5  ?  ?Current Facility-Administered Medications  ?Medication Dose Route Frequency Provider Last Rate Last Admin  ? ceFAZolin (ANCEF) IVPB 2g/100 mL premix  2 g Intravenous On Call to OR Newt Minion, MD      ? chlorhexidine (PERIDEX) 0.12 % solution 15 mL  15 mL Mouth/Throat Once Stoltzfus, March Rummage, DO      ? Or  ? MEDLINE mouth rinse  15 mL Mouth Rinse Once Stoltzfus, March Rummage, DO      ? lactated ringers infusion   Intravenous Continuous Stoltzfus, Gregory P, DO      ? tranexamic acid (CYKLOKAPRON) 2,000 mg in sodium chloride 0.9 % 50 mL Topical Application  [id number] mg Topical To OR Newt Minion, MD      ? tranexamic acid (CYKLOKAPRON) IVPB 1,000 mg  1,000 mg Intravenous To OR Newt Minion, MD      ? ?No Known Allergies  ?Social History  ? ?Tobacco Use  ? Smoking status: Never  ? Smokeless tobacco: Never  ?Substance Use Topics  ? Alcohol use: Yes  ?  Comment: socially  ?  ?Family History  ?Problem Relation Age of Onset  ? Diabetes Mother   ? Kidney disease Mother   ? Cancer Father   ?  Kidney  ?  ? ?Review of Systems  ?All other systems reviewed and are negative. ? ?Objective: ? ?Physical Exam ? ?Vital signs in last 24 hours: ?  ? ?Labs: ? ? ?Estimated body mass index is 32.82 kg/m? as calculated from the following: ?  Height as of 06/10/21: 6' (1.829 m). ?  Weight as of 06/10/21: 109.8 kg. ? ? ?Imaging Review ?Plain radiographs demonstrate moderate degenerative joint disease of the right knee(s). The overall alignment ismild varus. The bone quality appears to be adequate for age and reported activity level. ? ? ? ? ? ?Assessment/Plan: ? ?End stage arthritis, right knee   ? ?The patient history, physical examination, clinical judgment of the provider and imaging studies are consistent with end stage degenerative joint disease of the right knee(s) and total knee arthroplasty is deemed medically necessary. The treatment options including medical management, injection therapy arthroscopy and arthroplasty were discussed at length. The risks and benefits of total knee arthroplasty were presented and reviewed. The risks due to aseptic loosening, infection, stiffness, patella tracking problems, thromboembolic complications and other imponderables were discussed. The patient acknowledged the explanation, agreed to proceed with the plan and consent was signed. Patient is being admitted for inpatient treatment for surgery, pain control, PT, OT, prophylactic antibiotics, VTE prophylaxis, progressive ambulation and ADL's and discharge planning. The patient is planning to be discharged home with home health services ? ? ? ? ?Patient's anticipated LOS is less than 2 midnights, meeting these requirements: ?- Younger than 69 ?- Lives within 1 hour of care ?- Has a competent adult at home to recover with post-op recover ?- NO history of ? - Chronic pain requiring opiods ? - Diabetes ? - Coronary Artery Disease ? - Heart failure ? - Heart attack ? - Stroke ? - DVT/VTE ? - Cardiac arrhythmia ? - Respiratory Failure/COPD ? - Renal failure ? - Anemia ? - Advanced Liver disease ? ? ?

## 2021-06-25 NOTE — Transfer of Care (Signed)
Immediate Anesthesia Transfer of Care Note ? ?Patient: Jessica Huff ? ?Procedure(s) Performed: RIGHT TOTAL KNEE ARTHROPLASTY (Right: Knee) ? ?Patient Location: PACU ? ?Anesthesia Type:MAC, Regional and Spinal ? ?Level of Consciousness: awake and alert  ? ?Airway & Oxygen Therapy: Patient Spontanous Breathing ? ?Post-op Assessment: Report given to RN and Post -op Vital signs reviewed and stable ? ?Post vital signs: Reviewed and stable ? ?Last Vitals:  ?Vitals Value Taken Time  ?BP 106/77 06/25/21 0956  ?Temp    ?Pulse 82 06/25/21 0956  ?Resp 16 06/25/21 0956  ?SpO2 99 % 06/25/21 0956  ?Vitals shown include unvalidated device data. ? ?Last Pain:  ?Vitals:  ? 06/25/21 0707  ?TempSrc:   ?PainSc: 0-No pain  ?   ? ?  ? ?Complications: No notable events documented. ?

## 2021-06-26 ENCOUNTER — Encounter (HOSPITAL_COMMUNITY): Payer: Self-pay | Admitting: Orthopedic Surgery

## 2021-06-26 DIAGNOSIS — M1711 Unilateral primary osteoarthritis, right knee: Secondary | ICD-10-CM | POA: Diagnosis not present

## 2021-06-26 MED ORDER — OXYCODONE-ACETAMINOPHEN 5-325 MG PO TABS
1.0000 | ORAL_TABLET | ORAL | 0 refills | Status: DC | PRN
Start: 2021-06-26 — End: 2021-07-09

## 2021-06-26 NOTE — Plan of Care (Signed)

## 2021-06-26 NOTE — Discharge Summary (Signed)
Discharge Diagnoses:  ?Principal Problem: ?  Total knee replacement status, right ?Active Problems: ?  Unilateral primary osteoarthritis, right knee ? ? ?Surgeries: Procedure(s): ?RIGHT TOTAL KNEE ARTHROPLASTY on 06/25/2021  ?  ?Consultants:  ? ?Discharged Condition: Improved ? ?Hospital Course: Jessica Huff is an 61 y.o. female who was admitted 06/25/2021 with a chief complaint of osteoarthritis right knee, with a final diagnosis of Osteoarthritis Right Knee.  Patient was brought to the operating room on 06/25/2021 and underwent Procedure(s): ?RIGHT TOTAL KNEE ARTHROPLASTY.   ? ?Patient was given perioperative antibiotics:  ?Anti-infectives (From admission, onward)  ? ? Start     Dose/Rate Route Frequency Ordered Stop  ? 06/25/21 1415  ceFAZolin (ANCEF) IVPB 2g/100 mL premix       ? 2 g ?200 mL/hr over 30 Minutes Intravenous Every 6 hours 06/25/21 1316 06/25/21 2145  ? 06/25/21 0645  ceFAZolin (ANCEF) IVPB 2g/100 mL premix       ? 2 g ?200 mL/hr over 30 Minutes Intravenous On call to O.R. 06/25/21 0630 06/25/21 0831  ? ?  ?. ? ?Patient was given sequential compression devices, early ambulation, and aspirin for DVT prophylaxis. ? ?Recent vital signs: Patient Vitals for the past 24 hrs: ? BP Temp Temp src Pulse Resp SpO2  ?06/26/21 0535 (!) 128/110 98.9 ?F (37.2 ?C) Oral (!) 133 20 97 %  ?06/25/21 2002 128/62 98 ?F (36.7 ?C) Oral 85 16 99 %  ?06/25/21 1309 125/78 98 ?F (36.7 ?C) Oral 65 17 100 %  ?06/25/21 1236 125/78 -- -- 65 18 100 %  ?06/25/21 1155 (!) 146/65 (!) 97.5 ?F (36.4 ?C) -- 67 14 97 %  ?06/25/21 1125 110/73 -- -- 64 10 97 %  ?06/25/21 1056 124/68 -- -- 63 13 96 %  ?06/25/21 1041 113/76 -- -- 68 13 97 %  ?06/25/21 1026 126/77 -- -- 67 11 97 %  ?06/25/21 1011 130/71 -- -- 76 11 97 %  ?06/25/21 0956 106/77 (!) 97.5 ?F (36.4 ?C) -- 79 17 98 %  ?. ? ?Recent laboratory studies: No results found. ? ?Discharge Medications:   ?Allergies as of 06/26/2021   ?No Known Allergies ?  ? ?  ?Medication List  ?  ? ?TAKE these  medications   ? ?albuterol 108 (90 Base) MCG/ACT inhaler ?Commonly known as: VENTOLIN HFA ?Inhale 2 puffs into the lungs every 6 (six) hours as needed. ?What changed: reasons to take this ?  ?amitriptyline 25 MG tablet ?Commonly known as: ELAVIL ?Take 1 tablet (25 mg total) by mouth at bedtime. ?  ?ezetimibe 10 MG tablet ?Commonly known as: ZETIA ?Take 1 tablet (10 mg total) by mouth daily. ?  ?Flovent HFA 110 MCG/ACT inhaler ?Generic drug: fluticasone ?Inhale 1 puff into the lungs 2 (two) times daily. ?What changed:  ?when to take this ?reasons to take this ?  ?fluticasone 50 MCG/ACT nasal spray ?Commonly known as: FLONASE ?SHAKE LIQUID AND USE 2 SPRAYS IN EACH NOSTRIL DAILY ?What changed: See the new instructions. ?  ?levocetirizine 5 MG tablet ?Commonly known as: XYZAL ?Take 5 mg by mouth daily as needed for allergies. ?  ?losartan-hydrochlorothiazide 100-25 MG tablet ?Commonly known as: HYZAAR ?Take 1 tablet by mouth daily. ?  ?Multi-Vitamin tablet ?Take 1 tablet by mouth daily. ?  ?oxyCODONE-acetaminophen 5-325 MG tablet ?Commonly known as: PERCOCET/ROXICET ?Take 1 tablet by mouth every 4 (four) hours as needed. ?  ?pregabalin 100 MG capsule ?Commonly known as: Lyrica ?Take 1 capsule (100 mg total) by mouth  daily. Day or night for 1 week, then increase to 100 mg BID- for nerve pain ?  ?rosuvastatin 40 MG tablet ?Commonly known as: CRESTOR ?Take 1 tablet (40 mg total) by mouth daily. ?  ? ?  ? ?  ?  ? ? ?  ?Durable Medical Equipment  ?(From admission, onward)  ?  ? ? ?  ? ?  Start     Ordered  ? 06/25/21 1317  DME Bedside commode  Once       ?Question:  Patient needs a bedside commode to treat with the following condition  Answer:  Total knee replacement status, right  ? 06/25/21 1316  ? 06/25/21 1317  DME Walker rolling  Once       ?Question Answer Comment  ?Walker: With 5 Inch Wheels   ?Patient needs a walker to treat with the following condition Total knee replacement status, right   ?  ? 06/25/21 1316  ? ?   ?  ? ?  ? ? ?  ?Discharge Care Instructions  ?(From admission, onward)  ?  ? ? ?  ? ?  Start     Ordered  ? 06/26/21 0000  Weight bearing as tolerated       ?Question Answer Comment  ?Laterality right   ?Extremity Lower   ?  ? 06/26/21 0747  ? ?  ?  ? ?  ? ? ?Diagnostic Studies: No results found. ? ?Patient benefited maximally from their hospital stay and there were no complications.   ? ? ?Disposition: Discharge disposition: 01-Home or Self Care ? ? ? ? ? ?Discharge Instructions   ? ? Call MD / Call 911   Complete by: As directed ?  ? If you experience chest pain or shortness of breath, CALL 911 and be transported to the hospital emergency room.  If you develope a fever above 101 F, pus (white drainage) or increased drainage or redness at the wound, or calf pain, call your surgeon's office.  ? Constipation Prevention   Complete by: As directed ?  ? Drink plenty of fluids.  Prune juice may be helpful.  You may use a stool softener, such as Colace (over the counter) 100 mg twice a day.  Use MiraLax (over the counter) for constipation as needed.  ? Diet - low sodium heart healthy   Complete by: As directed ?  ? Increase activity slowly as tolerated   Complete by: As directed ?  ? Post-operative opioid taper instructions:   Complete by: As directed ?  ? POST-OPERATIVE OPIOID TAPER INSTRUCTIONS: ?It is important to wean off of your opioid medication as soon as possible. If you do not need pain medication after your surgery it is ok to stop day one. ?Opioids include: ?Codeine, Hydrocodone(Norco, Vicodin), Oxycodone(Percocet, oxycontin) and hydromorphone amongst others.  ?Long term and even short term use of opiods can cause: ?Increased pain response ?Dependence ?Constipation ?Depression ?Respiratory depression ?And more.  ?Withdrawal symptoms can include ?Flu like symptoms ?Nausea, vomiting ?And more ?Techniques to manage these symptoms ?Hydrate well ?Eat regular healthy meals ?Stay active ?Use relaxation  techniques(deep breathing, meditating, yoga) ?Do Not substitute Alcohol to help with tapering ?If you have been on opioids for less than two weeks and do not have pain than it is ok to stop all together.  ?Plan to wean off of opioids ?This plan should start within one week post op of your joint replacement. ?Maintain the same interval or time between taking each dose and  first decrease the dose.  ?Cut the total daily intake of opioids by one tablet each day ?Next start to increase the time between doses. ?The last dose that should be eliminated is the evening dose.  ? ?  ? Weight bearing as tolerated   Complete by: As directed ?  ? Laterality: right  ? Extremity: Lower  ? ?  ? ? Follow-up Information   ? ? Nadara Mustard, MD Follow up in 1 week(s).   ?Specialty: Orthopedic Surgery ?Contact information: ?[address] ?[place] [address] ?[phone] ? ? ?  ?  ? ?  ?  ? ?  ? ? ? ?Signed: ?Nadara Mustard ?06/26/2021, 7:49 AM ? ? ?

## 2021-06-26 NOTE — Progress Notes (Signed)
Called to patient room about fall,nurse  rushed to room and found her on the bathroom floor with the assigned NT besides her, completed assessment shows no sign of injury, did not hit her head, surgical site intact with no sign of bleeding, alert and oriented X4, vital sign normal at baseline. Described pain as 5 out of 10, pain meds given, Attending provider notified, following all the fall protocol and documentation,and will continue to monitor patient. ?

## 2021-06-26 NOTE — TOC Transition Note (Addendum)
Transition of Care (TOC) - CM/SW Discharge Note ? ? ?Patient Details  ?Name: Jessica Huff ?MRN: [id number] ?Date of Birth: 1960/12/24 ? ?Transition of [facility]) CM/SW Contact:  ?Sharin Mons, RN ?Phone Number: ?06/26/2021, 10:05 AM ? ? ?Clinical Narrative:    ? Patient will DC [id number] ?Anticipated DC date: 06/26/2021 ?Family notified: yes ?Transport by: car ? ? ?- S/P RIGHT TOTAL KNEE ARTHROPLASTY, 5/3  ? ?Per MD patient ready for DC today. RN, patient, patient's family, and [place] aware of DC. DME  RW and BSC will be delivered to bedside prior to d/c. ?Post hospital follow up noted on AVS. Pt without Rx med concerns. Sister to provide transportation to home. ? ?RNCM will sign off for now as intervention is no longer needed. Please consult [place] again if new needs arise.  ? ? ? ?Final next level of care: [place] ?Barriers to Discharge: No Barriers Identified ? ? ?Patient Goals and CMS Choice ?  ?  ?Choice offered to / list presented to : Patient ? ?Discharge Placement ?  ?           ?  ?  ?  ?  ? ?Discharge Plan and Services ?  ?  ?           ?DME Arranged: Walker rolling, 3-N-1 ?DME Agency: [employer] ?Date DME Agency Contacted: 06/26/21 ?Time DME Agency Contacted: [phone] ?Representative spoke with at DME Agency: Laban Emperor ?HH Arranged: PT ?[place] Agency: [place] ([place] ?Date HH Agency Contacted: 06/26/21 ?Time [place]: [id number] ?Representative spoke with at [place]: [place] ? ?Social Determinants of Health (SDOH) Interventions ?  ? ? ?Readmission Risk Interventions ?   ? View : No data to display.  ?  ?  ?  ? ? ? ? ? ?

## 2021-06-26 NOTE — Progress Notes (Signed)
Pt is hesitant to go forward with discharge.  No apparent reason provided.  MD notified.  Awaiting response.  ?

## 2021-06-26 NOTE — Discharge Summary (Signed)
Discharge Diagnoses:  ?Principal Problem: ?  Total knee replacement status, right ?Active Problems: ?  Unilateral primary osteoarthritis, right knee ? ? ?Surgeries: Procedure(s): ?RIGHT TOTAL KNEE ARTHROPLASTY on 06/25/2021  ?  ?Consultants:  ? ?Discharged Condition: Improved ? ?Hospital Course: Jessica Huff is an 61 y.o. female who was admitted 06/25/2021 with a chief complaint of osteoarthritis right knee, with a final diagnosis of Osteoarthritis Right Knee.  Patient was brought to the operating room on 06/25/2021 and underwent Procedure(s): ?RIGHT TOTAL KNEE ARTHROPLASTY.   ? ?Patient was given perioperative antibiotics:  ?Anti-infectives (From admission, onward)  ? ? Start     Dose/Rate Route Frequency Ordered Stop  ? 06/25/21 1415  ceFAZolin (ANCEF) IVPB 2g/100 mL premix       ? 2 g ?200 mL/hr over 30 Minutes Intravenous Every 6 hours 06/25/21 1316 06/25/21 2145  ? 06/25/21 0645  ceFAZolin (ANCEF) IVPB 2g/100 mL premix       ? 2 g ?200 mL/hr over 30 Minutes Intravenous On call to O.R. 06/25/21 0630 06/25/21 0831  ? ?  ?. ? ?Patient was given sequential compression devices, early ambulation, and aspirin for DVT prophylaxis. ? ?Recent vital signs: Patient Vitals for the past 24 hrs: ? BP Temp Temp src Pulse Resp SpO2  ?06/26/21 0535 (!) 128/110 98.9 ?F (37.2 ?C) Oral (!) 133 20 97 %  ?06/25/21 2002 128/62 98 ?F (36.7 ?C) Oral 85 16 99 %  ?06/25/21 1309 125/78 98 ?F (36.7 ?C) Oral 65 17 100 %  ?06/25/21 1236 125/78 -- -- 65 18 100 %  ?06/25/21 1155 (!) 146/65 (!) 97.5 ?F (36.4 ?C) -- 67 14 97 %  ?06/25/21 1125 110/73 -- -- 64 10 97 %  ?06/25/21 1056 124/68 -- -- 63 13 96 %  ?06/25/21 1041 113/76 -- -- 68 13 97 %  ?06/25/21 1026 126/77 -- -- 67 11 97 %  ?06/25/21 1011 130/71 -- -- 76 11 97 %  ?06/25/21 0956 106/77 (!) 97.5 ?F (36.4 ?C) -- 79 17 98 %  ?. ? ?Recent laboratory studies: No results found. ? ?Discharge Medications:   ?Allergies as of 06/26/2021   ?No Known Allergies ?  ? ?  ?Medication List  ?  ? ?TAKE these  medications   ? ?albuterol 108 (90 Base) MCG/ACT inhaler ?Commonly known as: VENTOLIN HFA ?Inhale 2 puffs into the lungs every 6 (six) hours as needed. ?What changed: reasons to take this ?  ?amitriptyline 25 MG tablet ?Commonly known as: ELAVIL ?Take 1 tablet (25 mg total) by mouth at bedtime. ?  ?ezetimibe 10 MG tablet ?Commonly known as: ZETIA ?Take 1 tablet (10 mg total) by mouth daily. ?  ?Flovent HFA 110 MCG/ACT inhaler ?Generic drug: fluticasone ?Inhale 1 puff into the lungs 2 (two) times daily. ?What changed:  ?when to take this ?reasons to take this ?  ?fluticasone 50 MCG/ACT nasal spray ?Commonly known as: FLONASE ?SHAKE LIQUID AND USE 2 SPRAYS IN EACH NOSTRIL DAILY ?What changed: See the new instructions. ?  ?levocetirizine 5 MG tablet ?Commonly known as: XYZAL ?Take 5 mg by mouth daily as needed for allergies. ?  ?losartan-hydrochlorothiazide 100-25 MG tablet ?Commonly known as: HYZAAR ?Take 1 tablet by mouth daily. ?  ?Multi-Vitamin tablet ?Take 1 tablet by mouth daily. ?  ?pregabalin 100 MG capsule ?Commonly known as: Lyrica ?Take 1 capsule (100 mg total) by mouth daily. Day or night for 1 week, then increase to 100 mg BID- for nerve pain ?  ?rosuvastatin 40 MG  tablet ?Commonly known as: CRESTOR ?Take 1 tablet (40 mg total) by mouth daily. ?  ? ?  ? ?  ?  ? ? ?  ?Durable Medical Equipment  ?(From admission, onward)  ?  ? ? ?  ? ?  Start     Ordered  ? 06/25/21 1317  DME Bedside commode  Once       ?Question:  Patient needs a bedside commode to treat with the following condition  Answer:  Total knee replacement status, right  ? 06/25/21 1316  ? 06/25/21 1317  DME Walker rolling  Once       ?Question Answer Comment  ?Walker: With 5 Inch Wheels   ?Patient needs a walker to treat with the following condition Total knee replacement status, right   ?  ? 06/25/21 1316  ? ?  ?  ? ?  ? ? ?  ?Discharge Care Instructions  ?(From admission, onward)  ?  ? ? ?  ? ?  Start     Ordered  ? 06/26/21 0000  Weight bearing  as tolerated       ?Question Answer Comment  ?Laterality right   ?Extremity Lower   ?  ? 06/26/21 0747  ? ?  ?  ? ?  ? ? ?Diagnostic Studies: No results found. ? ?Patient benefited maximally from their hospital stay and there were no complications.   ? ? ?Disposition: Discharge disposition: 01-Home or Self Care ? ? ? ? ? ?Discharge Instructions   ? ? Call MD / Call 911   Complete by: As directed ?  ? If you experience chest pain or shortness of breath, CALL 911 and be transported to the hospital emergency room.  If you develope a fever above 101 F, pus (white drainage) or increased drainage or redness at the wound, or calf pain, call your surgeon's office.  ? Constipation Prevention   Complete by: As directed ?  ? Drink plenty of fluids.  Prune juice may be helpful.  You may use a stool softener, such as Colace (over the counter) 100 mg twice a day.  Use MiraLax (over the counter) for constipation as needed.  ? Diet - low sodium heart healthy   Complete by: As directed ?  ? Increase activity slowly as tolerated   Complete by: As directed ?  ? Post-operative opioid taper instructions:   Complete by: As directed ?  ? POST-OPERATIVE OPIOID TAPER INSTRUCTIONS: ?It is important to wean off of your opioid medication as soon as possible. If you do not need pain medication after your surgery it is ok to stop day one. ?Opioids include: ?Codeine, Hydrocodone(Norco, Vicodin), Oxycodone(Percocet, oxycontin) and hydromorphone amongst others.  ?Long term and even short term use of opiods can cause: ?Increased pain response ?Dependence ?Constipation ?Depression ?Respiratory depression ?And more.  ?Withdrawal symptoms can include ?Flu like symptoms ?Nausea, vomiting ?And more ?Techniques to manage these symptoms ?Hydrate well ?Eat regular healthy meals ?Stay active ?Use relaxation techniques(deep breathing, meditating, yoga) ?Do Not substitute Alcohol to help with tapering ?If you have been on opioids for less than two weeks and do  not have pain than it is ok to stop all together.  ?Plan to wean off of opioids ?This plan should start within one week post op of your joint replacement. ?Maintain the same interval or time between taking each dose and first decrease the dose.  ?Cut the total daily intake of opioids by one tablet each day ?Next start to increase  the time between doses. ?The last dose that should be eliminated is the evening dose.  ? ?  ? Weight bearing as tolerated   Complete by: As directed ?  ? Laterality: right  ? Extremity: Lower  ? ?  ? ? Follow-up Information   ? ? Nadara Mustard, MD Follow up in 1 week(s).   ?Specialty: Orthopedic Surgery ?Contact information: ?[address] ?[place] [address] ?[phone] ? ? ?  ?  ? ?  ?  ? ?  ? ? ? ?Signed: ?Nadara Mustard ?06/26/2021, 7:47 AM ? ? ?

## 2021-06-26 NOTE — Progress Notes (Signed)
Physical Therapy Treatment ? ?Patient Details ?Name: Jessica Huff ?MRN: [id number] ?DOB: Apr 23, 1960 ?Today's Date: 06/26/2021 ? ? ?History of Present Illness Jessica Huff is a 61 y.o. female presenting 06/25/21 s/p R TKA. PMH includes lumbar fusion (2020), R ankle surgery (2018), BPPV, HTN, GERD, and R lateral meniscus tear (03/19/21). ? ?  ?PT Comments  ? ? Pt progressing slowly towards physical therapy goals. Pt continues to be limited by orthostatic hypotension and increased anxiety with mobility. See BP chart below for details. Pt has not been able to progress to gait training with PT, and will be home alone at times at d/c. Of note, pt with "panic attack" after initial stand. RN and MD notified of BP and anxiety symptoms. Will continue to follow.  ? ?Orthostatic BPs ? ?Sitting start of session 147/67  ?Standing - symptomatic 97/65  ?Sitting after transfer to chair 94/65  ?Sitting end of session - asymptomatic 119/60  ? ?   ?Recommendations for follow up therapy are one component of a multi-disciplinary discharge planning process, led by the attending physician.  Recommendations may be updated based on patient status, additional functional criteria and insurance authorization. ? ?Follow Up Recommendations ? Follow physician's recommendations for discharge plan and follow up therapies ?  ?  ?Assistance Recommended at Discharge Frequent or constant Supervision/Assistance  ?Patient can return home with the following Assistance with cooking/housework;Assist for transportation;Help with stairs or ramp for entrance;A lot of help with walking and/or transfers ?  ?Equipment Recommendations ? Rolling walker (2 wheels);BSC/3in1  ?  ?Recommendations for Other Services   ? ? ?  ?Precautions / Restrictions Precautions ?Precautions: Fall;Knee ?Precaution Comments: Reviewed no pillow/ice pack/roll under knee and to rest with knee in extension. ?Restrictions ?Weight Bearing Restrictions: Yes ?RLE Weight Bearing: Weight bearing as tolerated   ?  ? ?Mobility ? Bed Mobility ?  ?  ?  ?  ?  ?  ?  ?General bed mobility comments: Pt was received sitting up EOB with student nurse present. ?  ? ?Transfers ?Overall transfer level: Needs assistance ?Equipment used: Rolling walker (2 wheels) ?Transfers: Sit to/from Stand, Bed to chair/wheelchair/BSC ?Sit to Stand: Mod assist, +2 physical assistance, From elevated surface ?  ?Step pivot transfers: Min assist, +2 physical assistance, From elevated surface ?  ?  ?  ?General transfer comment: Pt with increased time and effort to achieve full stand. Poor coordination of RLE to advance around to chair with pivotal steps. Pt complaining of dizziness/lightheadedness and fatigue in standing. Noted BP dropped from 147/67 in sitting to 97/65 in standing. ?  ? ?Ambulation/Gait ?  ?  ?  ?  ?  ?  ?  ?General Gait Details: Unable to progress to gait training at this time 2? orthostatic hypotension. ? ? ?Stairs ?  ?  ?  ?  ?  ? ? ?Wheelchair Mobility ?  ? ?Modified Rankin (Stroke Patients Only) ?  ? ? ?  ?Balance Overall balance assessment: Needs assistance ?Sitting-balance support: Single extremity supported, Feet supported ?Sitting balance-Leahy Scale: Fair ?Sitting balance - Comments: pt reliant on single UE support in sitting with supervision for safety ?  ?Standing balance support: Bilateral upper extremity supported, During functional activity ?Standing balance-Leahy Scale: Poor ?Standing balance comment: Heavily reliant on UE support ?  ?  ?  ?  ?  ?  ?  ?  ?  ?  ?  ?  ? ?  ?Cognition Arousal/Alertness: Awake/alert ?Behavior During Therapy: Anxious, Flat affect ?Overall Cognitive Status: Within Functional  Limits for tasks assessed ?  ?  ?  ?  ?  ?  ?  ?  ?  ?  ?  ?  ?  ?  ?  ?  ?General Comments: Pt had a "panic attack" sitting EOB after BP dropped ?  ?  ? ?  ?Exercises Total Joint Exercises ?Quad Sets: 10 reps ?Heel Slides: 10 reps ?Hip ABduction/ADduction: 10 reps ?Long Arc Quad: 10 reps, AAROM ?Goniometric ROM: 15?-69?  AAROM in sitting (R knee) ? ?  ?General Comments   ?  ?  ? ?Pertinent Vitals/Pain Pain Assessment ?Pain Assessment: Faces ?Faces Pain Scale: Hurts even more ?Pain Location: R knee ?Pain Descriptors / Indicators: Operative site guarding, Sore ?Pain Intervention(s): Limited activity within patient's tolerance, Monitored during session, Repositioned  ? ? ?Home Living   ?  ?  ?  ?  ?  ?  ?  ?  ?  ?   ?  ?Prior Function    ?  ?  ?   ? ?PT Goals (current goals can now be found in the care plan section) Acute Rehab PT Goals ?Patient Stated Goal: Get back to the gym ?PT Goal Formulation: With patient ?Time For Goal Achievement: 07/02/21 ?Potential to Achieve Goals: Good ?Progress towards PT goals: Progressing toward goals ? ?  ?Frequency ? ? ? 7X/week ? ? ? ?  ?PT Plan Current plan remains appropriate  ? ? ?Co-evaluation   ?  ?  ?  ?  ? ?  ?AM-PAC PT "6 Clicks" Mobility   ?Outcome Measure ? Help needed turning from your back to your side while in a flat bed without using bedrails?: None ?Help needed moving from lying on your back to sitting on the side of a flat bed without using bedrails?: A Little ?Help needed moving to and from a bed to a chair (including a wheelchair)?: A Lot ?Help needed standing up from a chair using your arms (e.g., wheelchair or bedside chair)?: A Lot ?Help needed to walk in hospital room?: Total ?Help needed climbing 3-5 steps with a railing? : Total ?6 Click Score: 13 ? ?  ?End of Session Equipment Utilized During Treatment: Gait belt ?Activity Tolerance: Treatment limited secondary to medical complications (Comment) (Orthostatic hypotension) ?Patient left: in chair;with call bell/phone within reach;with chair alarm set ?Nurse Communication: Mobility status ?PT Visit Diagnosis: Other abnormalities of gait and mobility (R26.89);Muscle weakness (generalized) (M62.81);Difficulty in walking, not elsewhere classified (R26.2);Dizziness and giddiness (R42);Pain ?Pain - Right/Left: Right ?Pain - part of  body: Knee ?  ? ? ?Time: 1610-9604 ?PT Time Calculation (min) (ACUTE ONLY): 46 min ? ?Charges:  $Gait Training: 8-22 mins ?$Therapeutic Exercise: 8-22 mins ?$Therapeutic Activity: 8-22 mins          ?          ? ?Conni Slipper, PT, DPT ?Acute Rehabilitation Services ?Secure Chat Preferred ?Office: [phone]  ? ? ?Marylynn Pearson ?06/26/2021, 11:37 AM ? ?

## 2021-06-26 NOTE — Progress Notes (Signed)
Physical Therapy Treatment ?Patient Details ?Name: Jessica Huff ?MRN: [id number] ?DOB: November 25, 1960 ?Today's Date: 06/26/2021 ? ? ?History of Present Illness Keshia is a 61 y.o. female presenting 06/25/21 s/p R TKA. PMH includes lumbar fusion (2020), R ankle surgery (2018), BPPV, HTN, GERD, and R lateral meniscus tear (03/19/21). ? ?  ?PT Comments  ? ? Pt with continued orthostatic hypotension and anxiety limiting mobility progression. Pt demonstrating decreased safety awareness and slow processing this session. After a near-fall returning from the bathroom, pt continues to feel she can manage at home alone during the day while son works. Encouraged pt to discuss with family and try to line up more consistent assistance at home before d/c. Pt will need to complete stair training with PT prior to d/c. Will continue to follow.  ?  ?Recommendations for follow up therapy are one component of a multi-disciplinary discharge planning process, led by the attending physician.  Recommendations may be updated based on patient status, additional functional criteria and insurance authorization. ? ?Follow Up Recommendations ? Follow physician's recommendations for discharge plan and follow up therapies ?  ?  ?Assistance Recommended at Discharge Frequent or constant Supervision/Assistance  ?Patient can return home with the following Assistance with cooking/housework;Assist for transportation;Help with stairs or ramp for entrance;A lot of help with walking and/or transfers ?  ?Equipment Recommendations ? Rolling walker (2 wheels);BSC/3in1  ?  ?Recommendations for Other Services   ? ? ?  ?Precautions / Restrictions Precautions ?Precautions: Fall;Knee ?Precaution Booklet Issued: Yes (comment) ?Precaution Comments: Reviewed no pillow/ice pack/roll under knee and to rest with knee in extension. ?Restrictions ?Weight Bearing Restrictions: Yes ?RLE Weight Bearing: Weight bearing as tolerated  ?  ? ?Mobility ? Bed Mobility ?  ?  ?  ?  ?  ?  ?   ?General bed mobility comments: Pt was received sitting on toilet. NT asked PT to come in to assist as he was unable to assist pt to stand with +1. ?  ? ?Transfers ?Overall transfer level: Needs assistance ?Equipment used: Rolling walker (2 wheels) ?Transfers: Sit to/from Stand ?Sit to Stand: Mod assist, +2 physical assistance ?  ?Step pivot transfers: Min assist, +2 physical assistance, From elevated surface ?  ?  ?  ?General transfer comment: VC's for use of railings by the commode. Pt required assist for positioning of RLE  extension and adduction to neutral and L knee flexion. Heavy +2 assist to power up to full stand. Increased time for pt to gain/maintain standing balance. ?  ? ?Ambulation/Gait ?Ambulation/Gait assistance: Min assist, Total assist, +2 physical assistance, +2 safety/equipment ?Gait Distance (Feet): 5 Feet ?Assistive device: Rolling walker (2 wheels) ?Gait Pattern/deviations: Step-to pattern, Decreased stride length, Trunk flexed, Knee flexed in stance - right, Knee flexed in stance - left, Decreased weight shift to right, Decreased stance time - right ?Gait velocity: Decreased ?Gait velocity interpretation: <1.31 ft/sec, indicative of household ambulator ?  ?General Gait Details: Initially, pt taking steps forward and had potential to ambulate well. When PT suggested we go to a farther target with a chair follow, pt suddenly reports she can't do it and knees began to flex in standing. +2 total assist required to guide pt around to a chair. At one point pt was sitting on therapist's knee. Pt lethargic in the chair and BP 85/48 at the time. RN and MD notified. ? ? ?Stairs ?  ?  ?  ?  ?  ? ? ?Wheelchair Mobility ?  ? ?Modified Rankin (Stroke Patients Only) ?  ? ? ?  ?  Balance Overall balance assessment: Needs assistance ?Sitting-balance support: Single extremity supported, Feet supported ?Sitting balance-Leahy Scale: Fair ?Sitting balance - Comments: pt reliant on single UE support in sitting with  supervision for safety ?  ?Standing balance support: Bilateral upper extremity supported, During functional activity ?Standing balance-Leahy Scale: Poor ?Standing balance comment: Heavily reliant on UE support ?  ?  ?  ?  ?  ?  ?  ?  ?  ?  ?  ?  ? ?  ?Cognition Arousal/Alertness: Awake/alert ?Behavior During Therapy: Anxious, Flat affect ?Overall Cognitive Status: Decreased safety awareness; slow processing ?  ?  ?  ?  ?  ?  ?  ?  ?  ?  ?  ?  ?  ?  ?  ?  ?General Comments: Pt had a "panic attack" sitting EOB after BP dropped ?  ?  ? ?  ?Exercises Total Joint Exercises ?Quad Sets: 10 reps ?Heel Slides: 10 reps ?Hip ABduction/ADduction: 10 reps ?Long Arc Quad: 10 reps, AAROM ?Goniometric ROM: 15?-69? AAROM in sitting (R knee) ? ?  ?General Comments   ?  ?  ? ?Pertinent Vitals/Pain Pain Assessment ?Pain Assessment: Faces ?Faces Pain Scale: Hurts a little bit ?Pain Location: R knee ?Pain Descriptors / Indicators: Operative site guarding, Sore ?Pain Intervention(s): Limited activity within patient's tolerance, Monitored during session, Repositioned  ? ? ?Home Living   ?  ?  ?  ?  ?  ?  ?  ?  ?  ?   ?  ?Prior Function    ?  ?  ?   ? ?PT Goals (current goals can now be found in the care plan section) Acute Rehab PT Goals ?Patient Stated Goal: Get back to the gym ?PT Goal Formulation: With patient ?Time For Goal Achievement: 07/02/21 ?Potential to Achieve Goals: Good ?Progress towards PT goals: Progressing toward goals ? ?  ?Frequency ? ? ? 7X/week ? ? ? ?  ?PT Plan Current plan remains appropriate  ? ? ?Co-evaluation   ?  ?  ?  ?  ? ?  ?AM-PAC PT "6 Clicks" Mobility   ?Outcome Measure ? Help needed turning from your back to your side while in a flat bed without using bedrails?: None ?Help needed moving from lying on your back to sitting on the side of a flat bed without using bedrails?: A Little ?Help needed moving to and from a bed to a chair (including a wheelchair)?: Total ?Help needed standing up from a chair using  your arms (e.g., wheelchair or bedside chair)?: Total ?Help needed to walk in hospital room?: Total ?Help needed climbing 3-5 steps with a railing? : Total ?6 Click Score: 11 ? ?  ?End of Session Equipment Utilized During Treatment: Gait belt ?Activity Tolerance: Treatment limited secondary to medical complications (Comment) (Orthostatic hypotension) ?Patient left: in chair;with call bell/phone within reach;with chair alarm set ?Nurse Communication: Mobility status ?PT Visit Diagnosis: Other abnormalities of gait and mobility (R26.89);Muscle weakness (generalized) (M62.81);Difficulty in walking, not elsewhere classified (R26.2);Dizziness and giddiness (R42);Pain ?Pain - Right/Left: Right ?Pain - part of body: Knee ?  ? ? ?Time: 2229-7989 ?PT Time Calculation (min) (ACUTE ONLY): 14 min ? ?Charges:  $Gait Training: 8-22 mins          ?          ? ?Conni Slipper, PT, DPT ?Acute Rehabilitation Services ?Secure Chat Preferred ?Office: [phone]  ? ? ?Marylynn Pearson ?06/26/2021, 3:21 PM ? ?

## 2021-06-26 NOTE — Progress Notes (Signed)
Patient ID: Jessica Huff, female   DOB: [birth year], 61 y.o.   MRN: [id number] ?Patient is postoperative day 1 total knee arthroplasty the ice machine is working.  Patient has no complaints this morning.  Plan for physical therapy and discharge to home after therapy with home health physical therapy and DME. ?

## 2021-06-27 DIAGNOSIS — M1711 Unilateral primary osteoarthritis, right knee: Secondary | ICD-10-CM | POA: Diagnosis not present

## 2021-06-27 NOTE — Plan of Care (Signed)

## 2021-06-27 NOTE — Progress Notes (Signed)
Physical Therapy Treatment ?Patient Details ?Name: Jessica Huff ?MRN: [id number] ?DOB: 11-25-1960 ?Today's Date: 06/27/2021 ? ? ?History of Present Illness Jessica Huff is a 61 y.o. female presenting 06/25/21 s/p R TKA. PMH includes lumbar fusion (2020), R ankle surgery (2018), BPPV, HTN, GERD, and R lateral meniscus tear (03/19/21). ? ?  ?PT Comments  ? ? Pt seen for PM session to continue gait progression for safe d/c to home. Pt with increased tolerance for ambulation at min guard assist with RW with no c/o dizziness when coming to standing or during ambulation, cues needed at start for sequencing with good carry through, no LOB noted. Pt able to demonstrate safe toilet transfer in BR with min assist. Educated pt re; increasing activity tolerance slowly, HEP compliance, ice, safe car entry/exit and importance of continued mobility, pt verbalizing understanding. Pt continues to benefit from skilled PT services to progress toward functional mobility goals.  ?  ?Recommendations for follow up therapy are one component of a multi-disciplinary discharge planning process, led by the attending physician.  Recommendations may be updated based on patient status, additional functional criteria and insurance authorization. ? ?Follow Up Recommendations ? Follow physician's recommendations for discharge plan and follow up therapies ?  ?  ?Assistance Recommended at Discharge Frequent or constant Supervision/Assistance  ?Patient can return home with the following Assistance with cooking/housework;Assist for transportation;Help with stairs or ramp for entrance;A lot of help with walking and/or transfers ?  ?Equipment Recommendations ? Rolling walker (2 wheels);BSC/3in1  ?  ?Recommendations for Other Services   ? ? ?  ?Precautions / Restrictions Precautions ?Precautions: Fall;Knee ?Precaution Booklet Issued: Yes (comment) ?Precaution Comments: Reviewed no pillow/ice pack/roll under knee and to rest with knee in  extension. ?Restrictions ?Weight Bearing Restrictions: Yes ?RLE Weight Bearing: Weight bearing as tolerated  ?  ? ?Mobility ? Bed Mobility ?Overal bed mobility: Needs Assistance ?Bed Mobility: Supine to Sit, Sit to Supine ?  ?  ?Supine to sit: Min assist ?  ?  ?General bed mobility comments: pt OOB on arrival ?  ? ?Transfers ?Overall transfer level: Needs assistance ?Equipment used: Rolling walker (2 wheels) ?Transfers: Sit to/from Stand ?Sit to Stand: Min assist, Min guard ?  ?Step pivot transfers: Min assist, Mod assist ?  ?  ?  ?General transfer comment: min a to come to standing from recliner to steady on rise, min a to power up from toilet.good eceentric control to sit ?  ? ?Ambulation/Gait ?Ambulation/Gait assistance: Min assist ?Gait Distance (Feet): 42 Feet ?Assistive device: Rolling walker (2 wheels) ?Gait Pattern/deviations: Step-to pattern, Decreased stride length, Trunk flexed, Knee flexed in stance - right, Knee flexed in stance - left, Decreased weight shift to right, Decreased stance time - right ?Gait velocity: Decreased ?  ?  ?General Gait Details: slow steady gait in room, close chair follow for safety but not needed. ? ? ?Stairs ?  ?  ?  ?  ?  ? ? ?Wheelchair Mobility ?  ? ?Modified Rankin (Stroke Patients Only) ?  ? ? ?  ?Balance Overall balance assessment: Needs assistance ?Sitting-balance support: Single extremity supported, Feet supported ?Sitting balance-Leahy Scale: Fair ?Sitting balance - Comments: pt reliant on single UE support in sitting with supervision for safety ?  ?Standing balance support: Bilateral upper extremity supported, During functional activity ?Standing balance-Leahy Scale: Poor ?Standing balance comment: Heavily reliant on UE support ?  ?  ?  ?  ?  ?  ?  ?  ?  ?  ?  ?  ? ?  ?  Cognition Arousal/Alertness: Awake/alert ?Behavior During Therapy: Anxious, Flat affect ?Overall Cognitive Status: Within Functional Limits for tasks assessed ?  ?  ?  ?  ?  ?  ?  ?  ?  ?  ?  ?  ?  ?   ?  ?  ?  ?  ?  ? ?  ?Exercises Total Joint Exercises ?Ankle Circles/Pumps: Both, 20 reps ?Quad Sets: 10 reps ?Heel Slides: 10 reps ?Hip ABduction/ADduction: 10 reps ? ?  ?General Comments   ?  ?  ? ?Pertinent Vitals/Pain Pain Assessment ?Pain Assessment: Faces ?Faces Pain Scale: Hurts a little bit ?Pain Location: R knee ?Pain Descriptors / Indicators: Operative site guarding, Sore ?Pain Intervention(s): Limited activity within patient's tolerance, Monitored during session  ? ? ?Home Living   ?  ?  ?  ?  ?  ?  ?  ?  ?  ?   ?  ?Prior Function    ?  ?  ?   ? ?PT Goals (current goals can now be found in the care plan section) Acute Rehab PT Goals ?Patient Stated Goal: Get back to the gym ?PT Goal Formulation: With patient ?Time For Goal Achievement: 07/02/21 ? ?  ?Frequency ? ? ? 7X/week ? ? ? ?  ?PT Plan Current plan remains appropriate  ? ? ?Co-evaluation   ?  ?  ?  ?  ? ?  ?AM-PAC PT "6 Clicks" Mobility   ?Outcome Measure ? Help needed turning from your back to your side while in a flat bed without using bedrails?: None ?Help needed moving from lying on your back to sitting on the side of a flat bed without using bedrails?: A Little ?Help needed moving to and from a bed to a chair (including a wheelchair)?: Total ?Help needed standing up from a chair using your arms (e.g., wheelchair or bedside chair)?: A Lot ?Help needed to walk in hospital room?: A Lot ?Help needed climbing 3-5 steps with a railing? : Total ?6 Click Score: 13 ? ?  ?End of Session Equipment Utilized During Treatment: Gait belt ?Activity Tolerance: Patient tolerated treatment well (Orthostatic hypotension) ?Patient left: in chair;with call bell/phone within reach ?Nurse Communication: Mobility status ?PT Visit Diagnosis: Other abnormalities of gait and mobility (R26.89);Muscle weakness (generalized) (M62.81);Difficulty in walking, not elsewhere classified (R26.2);Dizziness and giddiness (R42);Pain ?Pain - Right/Left: Right ?Pain - part of body:  Knee ?  ? ? ?Time: 1240-1306 ?PT Time Calculation (min) (ACUTE ONLY): 26 min ? ?Charges:  $Gait Training: 23-37 mins ?$Therapeutic Activity: 8-22 mins          ?          ? ?Lenora Boys. PTA ?Acute Rehabilitation Services ?Office: [phone] ? ? ? ?Jessica Huff ?06/27/2021, 1:20 PM ? ?

## 2021-06-27 NOTE — Progress Notes (Signed)
Physical Therapy Treatment ?Patient Details ?Name: Jessica Huff ?MRN: [id number] ?DOB: October 21, 1960 ?Today's Date: 06/27/2021 ? ? ?History of Present Illness Adamariz is a 61 y.o. female presenting 06/25/21 s/p R TKA. PMH includes lumbar fusion (2020), R ankle surgery (2018), BPPV, HTN, GERD, and R lateral meniscus tear (03/19/21). ? ?  ?PT Comments  ? ? Pt received supine and agreeable to session with slow progress towards mobility progression secondary to continued symptomatic orthostatic hypotension (see table below) pt with c/o dizziness in sitting and standing. Pt min a for bed mobility and able to come to standing from elevated EOB with min a to steady on rise and no physical assist needed to power up this session. Pt able to take some pivotal steps to recliner with up to mod a, further ambulation deferred secondary to symptomatic orthostatics. Plan to continue to progress mobility as able in PM session. Pt continues to benefit from skilled PT services to progress toward functional mobility goals.  ? ?Supine BP: 155/74 ?Sitting EOB BP: 146/94 ?Standing BP: 92/72 ? ?  ?Recommendations for follow up therapy are one component of a multi-disciplinary discharge planning process, led by the attending physician.  Recommendations may be updated based on patient status, additional functional criteria and insurance authorization. ? ?Follow Up Recommendations ? Follow physician's recommendations for discharge plan and follow up therapies ?  ?  ?Assistance Recommended at Discharge Frequent or constant Supervision/Assistance  ?Patient can return home with the following Assistance with cooking/housework;Assist for transportation;Help with stairs or ramp for entrance;A lot of help with walking and/or transfers ?  ?Equipment Recommendations ? Rolling walker (2 wheels);BSC/3in1  ?  ?Recommendations for Other Services   ? ? ?  ?Precautions / Restrictions Precautions ?Precautions: Fall;Knee ?Precaution Booklet Issued: Yes  (comment) ?Precaution Comments: Reviewed no pillow/ice pack/roll under knee and to rest with knee in extension. ?Restrictions ?Weight Bearing Restrictions: Yes ?RLE Weight Bearing: Weight bearing as tolerated  ?  ? ?Mobility ? Bed Mobility ?Overal bed mobility: Needs Assistance ?Bed Mobility: Supine to Sit, Sit to Supine ?  ?  ?Supine to sit: Min assist ?  ?  ?General bed mobility comments: min a for LLE management, pt able to self mobilize to EOB with use of gait belt as strap ?  ? ?Transfers ?Overall transfer level: Needs assistance ?Equipment used: Rolling walker (2 wheels) ?Transfers: Sit to/from Stand ?Sit to Stand: Min assist, From elevated surface ?  ?Step pivot transfers: Min assist, Mod assist ?  ?  ?  ?General transfer comment: min a to come to standing from elevated EOB to pt stated height of bed in home. min a to step pivot to recliner for RW management, mod a for cues for LE sequencing. pt with poor eccentric control come to sit. ?  ? ?Ambulation/Gait ?  ?  ?  ?  ?  ?  ?  ?General Gait Details: amb deffered secondary to BP drop and pt symptomatic ? ? ?Stairs ?  ?  ?  ?  ?  ? ? ?Wheelchair Mobility ?  ? ?Modified Rankin (Stroke Patients Only) ?  ? ? ?  ?Balance Overall balance assessment: Needs assistance ?Sitting-balance support: Single extremity supported, Feet supported ?Sitting balance-Leahy Scale: Fair ?Sitting balance - Comments: pt reliant on single UE support in sitting with supervision for safety ?  ?Standing balance support: Bilateral upper extremity supported, During functional activity ?Standing balance-Leahy Scale: Poor ?Standing balance comment: Heavily reliant on UE support ?  ?  ?  ?  ?  ?  ?  ?  ?  ?  ?  ?  ? ?  ?  Cognition Arousal/Alertness: Awake/alert ?Behavior During Therapy: Anxious, Flat affect ?Overall Cognitive Status: Within Functional Limits for tasks assessed ?  ?  ?  ?  ?  ?  ?  ?  ?  ?  ?  ?  ?  ?  ?  ?  ?  ?  ?  ? ?  ?Exercises Total Joint Exercises ?Ankle Circles/Pumps:  Both, 20 reps ? ?  ?General Comments   ?  ?  ? ?Pertinent Vitals/Pain Pain Assessment ?Pain Assessment: Faces ?Faces Pain Scale: Hurts a little bit ?Pain Location: R knee ?Pain Descriptors / Indicators: Operative site guarding, Sore ?Pain Intervention(s): Limited activity within patient's tolerance, Monitored during session, Repositioned  ? ? ?Home Living   ?  ?  ?  ?  ?  ?  ?  ?  ?  ?   ?  ?Prior Function    ?  ?  ?   ? ?PT Goals (current goals can now be found in the care plan section) Acute Rehab PT Goals ?Patient Stated Goal: Get back to the gym ?PT Goal Formulation: With patient ?Time For Goal Achievement: 07/02/21 ? ?  ?Frequency ? ? ? 7X/week ? ? ? ?  ?PT Plan Current plan remains appropriate  ? ? ?Co-evaluation   ?  ?  ?  ?  ? ?  ?AM-PAC PT "6 Clicks" Mobility   ?Outcome Measure ? Help needed turning from your back to your side while in a flat bed without using bedrails?: None ?Help needed moving from lying on your back to sitting on the side of a flat bed without using bedrails?: A Little ?Help needed moving to and from a bed to a chair (including a wheelchair)?: Total ?Help needed standing up from a chair using your arms (e.g., wheelchair or bedside chair)?: A Lot ?Help needed to walk in hospital room?: A Lot ?Help needed climbing 3-5 steps with a railing? : Total ?6 Click Score: 13 ? ?  ?End of Session Equipment Utilized During Treatment: Gait belt ?Activity Tolerance: Treatment limited secondary to medical complications (Comment) (Orthostatic hypotension) ?Patient left: in chair;with call bell/phone within reach ?Nurse Communication: Mobility status;Other (comment) (BP) ?PT Visit Diagnosis: Other abnormalities of gait and mobility (R26.89);Muscle weakness (generalized) (M62.81);Difficulty in walking, not elsewhere classified (R26.2);Dizziness and giddiness (R42);Pain ?Pain - Right/Left: Right ?Pain - part of body: Knee ?  ? ? ?Time: 6440-3474 ?PT Time Calculation (min) (ACUTE ONLY): 33 min ? ?Charges:   $Gait Training: 8-22 mins ?$Therapeutic Activity: 8-22 mins          ?          ? ?Lenora Boys. PTA ?Acute Rehabilitation Services ?Office: [phone] ? ? ? ?Marlana Salvage Quenten Nawaz ?06/27/2021, 10:08 AM ? ?

## 2021-06-27 NOTE — Progress Notes (Signed)
Patient ID: Jessica Huff, female   DOB: 03-30-1960, 61 y.o.   MRN: [id number] ?Patient without complaints this morning.  Plan for physical therapy and discharge after physical therapy today. ?

## 2021-06-27 NOTE — Discharge Summary (Signed)
Discharge Diagnoses:  ?Principal Problem: ?  Total knee replacement status, right ?Active Problems: ?  Unilateral primary osteoarthritis, right knee ? ? ?Surgeries: Procedure(s): ?RIGHT TOTAL KNEE ARTHROPLASTY on 06/25/2021  ?  ?Consultants:  ? ?Discharged Condition: Improved ? ?Hospital Course: Jessica Huff is an 61 y.o. female who was admitted 06/25/2021 with a chief complaint of osteoarthritis right knee, with a final diagnosis of Osteoarthritis Right Knee.  Patient was brought to the operating room on 06/25/2021 and underwent Procedure(s): ?RIGHT TOTAL KNEE ARTHROPLASTY.   ? ?Patient was given perioperative antibiotics:  ?Anti-infectives (From admission, onward)  ? ? Start     Dose/Rate Route Frequency Ordered Stop  ? 06/25/21 1415  ceFAZolin (ANCEF) IVPB 2g/100 mL premix       ? 2 g ?200 mL/hr over 30 Minutes Intravenous Every 6 hours 06/25/21 1316 06/25/21 2145  ? 06/25/21 0645  ceFAZolin (ANCEF) IVPB 2g/100 mL premix       ? 2 g ?200 mL/hr over 30 Minutes Intravenous On call to O.R. 06/25/21 0630 06/25/21 0831  ? ?  ?. ? ?Patient was given sequential compression devices, early ambulation, and aspirin for DVT prophylaxis. ? ?Recent vital signs: Patient Vitals for the past 24 hrs: ? BP Temp Temp src Pulse Resp SpO2  ?06/27/21 0742 -- -- -- -- -- 98 %  ?06/27/21 0738 136/65 98.8 ?F (37.1 ?C) Oral -- 17 --  ?06/26/21 2027 (!) 151/68 98.7 ?F (37.1 ?C) Oral (!) 105 16 100 %  ?06/26/21 1300 -- 98.4 ?F (36.9 ?C) Oral -- -- --  ?06/26/21 1045 (!) 147/67 -- -- (!) 107 18 98 %  ?. ? ?Recent laboratory studies: No results found. ? ?Discharge Medications:   ?Allergies as of 06/27/2021   ?No Known Allergies ?  ? ?  ?Medication List  ?  ? ?TAKE these medications   ? ?albuterol 108 (90 Base) MCG/ACT inhaler ?Commonly known as: VENTOLIN HFA ?Inhale 2 puffs into the lungs every 6 (six) hours as needed. ?What changed: reasons to take this ?  ?amitriptyline 25 MG tablet ?Commonly known as: ELAVIL ?Take 1 tablet (25 mg total) by mouth  at bedtime. ?  ?ezetimibe 10 MG tablet ?Commonly known as: ZETIA ?Take 1 tablet (10 mg total) by mouth daily. ?  ?Flovent HFA 110 MCG/ACT inhaler ?Generic drug: fluticasone ?Inhale 1 puff into the lungs 2 (two) times daily. ?What changed:  ?when to take this ?reasons to take this ?  ?fluticasone 50 MCG/ACT nasal spray ?Commonly known as: FLONASE ?SHAKE LIQUID AND USE 2 SPRAYS IN EACH NOSTRIL DAILY ?What changed: See the new instructions. ?  ?levocetirizine 5 MG tablet ?Commonly known as: XYZAL ?Take 5 mg by mouth daily as needed for allergies. ?  ?losartan-hydrochlorothiazide 100-25 MG tablet ?Commonly known as: HYZAAR ?Take 1 tablet by mouth daily. ?  ?Multi-Vitamin tablet ?Take 1 tablet by mouth daily. ?  ?oxyCODONE-acetaminophen 5-325 MG tablet ?Commonly known as: PERCOCET/ROXICET ?Take 1 tablet by mouth every 4 (four) hours as needed. ?  ?pregabalin 100 MG capsule ?Commonly known as: Lyrica ?Take 1 capsule (100 mg total) by mouth daily. Day or night for 1 week, then increase to 100 mg BID- for nerve pain ?  ?rosuvastatin 40 MG tablet ?Commonly known as: CRESTOR ?Take 1 tablet (40 mg total) by mouth daily. ?  ? ?  ? ?  ?  ? ? ?  ?Durable Medical Equipment  ?(From admission, onward)  ?  ? ? ?  ? ?  Start  Ordered  ? 06/26/21 0759  DME Bedside commode  Once       ?Comments: Pt confined to room without bath room  ?Question:  Patient needs a bedside commode to treat with the following condition  Answer:  Total knee replacement status, right  ? 06/26/21 0759  ? 06/25/21 1317  DME Walker rolling  Once       ?Question Answer Comment  ?Walker: With 5 Inch Wheels   ?Patient needs a walker to treat with the following condition Total knee replacement status, right   ?  ? 06/25/21 1316  ? ?  ?  ? ?  ? ? ?  ?Discharge Care Instructions  ?(From admission, onward)  ?  ? ? ?  ? ?  Start     Ordered  ? 06/26/21 0000  Weight bearing as tolerated       ?Question Answer Comment  ?Laterality right   ?Extremity Lower   ?  ?  06/26/21 0747  ? ?  ?  ? ?  ? ? ?Diagnostic Studies: No results found. ? ?Patient benefited maximally from their hospital stay and there were no complications.   ? ? ?Disposition: Discharge disposition: 01-Home or Self Care ? ? ? ? ? ?Discharge Instructions   ? ? Call MD / Call 911   Complete by: As directed ?  ? If you experience chest pain or shortness of breath, CALL 911 and be transported to the hospital emergency room.  If you develope a fever above 101 F, pus (white drainage) or increased drainage or redness at the wound, or calf pain, call your surgeon's office.  ? Call MD / Call 911   Complete by: As directed ?  ? If you experience chest pain or shortness of breath, CALL 911 and be transported to the hospital emergency room.  If you develope a fever above 101 F, pus (white drainage) or increased drainage or redness at the wound, or calf pain, call your surgeon's office.  ? Constipation Prevention   Complete by: As directed ?  ? Drink plenty of fluids.  Prune juice may be helpful.  You may use a stool softener, such as Colace (over the counter) 100 mg twice a day.  Use MiraLax (over the counter) for constipation as needed.  ? Constipation Prevention   Complete by: As directed ?  ? Drink plenty of fluids.  Prune juice may be helpful.  You may use a stool softener, such as Colace (over the counter) 100 mg twice a day.  Use MiraLax (over the counter) for constipation as needed.  ? Diet - low sodium heart healthy   Complete by: As directed ?  ? Diet - low sodium heart healthy   Complete by: As directed ?  ? Increase activity slowly as tolerated   Complete by: As directed ?  ? Increase activity slowly as tolerated   Complete by: As directed ?  ? Post-operative opioid taper instructions:   Complete by: As directed ?  ? POST-OPERATIVE OPIOID TAPER INSTRUCTIONS: ?It is important to wean off of your opioid medication as soon as possible. If you do not need pain medication after your surgery it is ok to stop day  one. ?Opioids include: ?Codeine, Hydrocodone(Norco, Vicodin), Oxycodone(Percocet, oxycontin) and hydromorphone amongst others.  ?Long term and even short term use of opiods can cause: ?Increased pain response ?Dependence ?Constipation ?Depression ?Respiratory depression ?And more.  ?Withdrawal symptoms can include ?Flu like symptoms ?Nausea, vomiting ?And more ?Techniques to  manage these symptoms ?Hydrate well ?Eat regular healthy meals ?Stay active ?Use relaxation techniques(deep breathing, meditating, yoga) ?Do Not substitute Alcohol to help with tapering ?If you have been on opioids for less than two weeks and do not have pain than it is ok to stop all together.  ?Plan to wean off of opioids ?This plan should start within one week post op of your joint replacement. ?Maintain the same interval or time between taking each dose and first decrease the dose.  ?Cut the total daily intake of opioids by one tablet each day ?Next start to increase the time between doses. ?The last dose that should be eliminated is the evening dose.  ? ?  ? Post-operative opioid taper instructions:   Complete by: As directed ?  ? POST-OPERATIVE OPIOID TAPER INSTRUCTIONS: ?It is important to wean off of your opioid medication as soon as possible. If you do not need pain medication after your surgery it is ok to stop day one. ?Opioids include: ?Codeine, Hydrocodone(Norco, Vicodin), Oxycodone(Percocet, oxycontin) and hydromorphone amongst others.  ?Long term and even short term use of opiods can cause: ?Increased pain response ?Dependence ?Constipation ?Depression ?Respiratory depression ?And more.  ?Withdrawal symptoms can include ?Flu like symptoms ?Nausea, vomiting ?And more ?Techniques to manage these symptoms ?Hydrate well ?Eat regular healthy meals ?Stay active ?Use relaxation techniques(deep breathing, meditating, yoga) ?Do Not substitute Alcohol to help with tapering ?If you have been on opioids for less than two weeks and do not have  pain than it is ok to stop all together.  ?Plan to wean off of opioids ?This plan should start within one week post op of your joint replacement. ?Maintain the same interval or time between taking each dose and f

## 2021-07-02 ENCOUNTER — Telehealth: Payer: Self-pay | Admitting: Orthopedic Surgery

## 2021-07-02 NOTE — Telephone Encounter (Signed)
Called and gave verbal orders for PT as requested below. Pt is s/p a total knee replacement.  ?

## 2021-07-02 NOTE — Telephone Encounter (Signed)
Received call from [place] with [facility] needing verbal orders for HHPT 2 Wk 1, 3 WK 1 and 2 wk 2. Ashanati said start of care began today.   The numbe to contact Sharla Kidney is [phone] ?

## 2021-07-04 ENCOUNTER — Telehealth: Payer: Self-pay | Admitting: Orthopedic Surgery

## 2021-07-04 NOTE — Telephone Encounter (Signed)
Eunice Blase w\[facility] is calling she needs a signed order from the Dr to start therapy ? ?Please fax to [phone] ?Attn Case Managment ?

## 2021-07-04 NOTE — Telephone Encounter (Signed)
I do not this is all she gave me  ?

## 2021-07-04 NOTE — Telephone Encounter (Signed)
Hey, do you happen to have a phone number from her? We do not know what form she is referring to. A letter from [place]? Did she fax a form to [place] to fill out? If you do let me know, Thank you. ?

## 2021-07-09 ENCOUNTER — Ambulatory Visit (INDEPENDENT_AMBULATORY_CARE_PROVIDER_SITE_OTHER): Payer: PRIVATE HEALTH INSURANCE | Admitting: Family

## 2021-07-09 ENCOUNTER — Ambulatory Visit (INDEPENDENT_AMBULATORY_CARE_PROVIDER_SITE_OTHER): Payer: PRIVATE HEALTH INSURANCE

## 2021-07-09 ENCOUNTER — Encounter: Payer: Self-pay | Admitting: Family

## 2021-07-09 DIAGNOSIS — Z96651 Presence of right artificial knee joint: Secondary | ICD-10-CM

## 2021-07-09 MED ORDER — OXYCODONE-ACETAMINOPHEN 5-325 MG PO TABS: 1.0000 | ORAL_TABLET | Freq: Four times a day (QID) | ORAL | 0 refills | Status: DC | PRN

## 2021-07-09 NOTE — Progress Notes (Signed)
? ?  Post-Op Visit Note ?  ?Patient: Jessica Huff           ?Date of Birth: March 27, 1960           ?MRN: [id number] ?Visit Date: 07/09/2021 ?PCP: Loyola Mast, MD ? ?Chief Complaint:  ?Chief Complaint  ?Patient presents with  ? Right Knee - Routine Post Op  ?  06/25/21 right total knee replacement  ? ? ?HPI:  ?HPI ?The patient is a 61 year old woman who presents status post right total knee arthroplasty about 2 weeks ago she has been doing home health physical therapy has had 3 sessions so far.  She is happy with her progress. ?Ortho Exam ?On examination of the right knee this is incision is well approximated staples this is healing well there is no gaping or drainage no erythema she lacks about 10 degrees of full extension and has flexion to 85 degrees ? ?Visit Diagnoses:  ?1. Status post total right knee replacement   ? ? ?Plan: Continue with physical therapy.  We will go ahead and order outpatient physical therapy.  Plan to follow-up with her in 1 month ? ?Follow-Up Instructions: Return in about 2 weeks (around 07/23/2021).  ? ?Imaging: ?No results found. ? ?Orders:  ?Orders Placed This Encounter  ?Procedures  ? XR KNEE 3 VIEW RIGHT  ? ?No orders of the defined types were placed in this encounter. ? ? ? ?PMFS History: ?Patient Active Problem List  ? Diagnosis Date Noted  ? Total knee replacement status, right 06/25/2021  ? Tear of lateral meniscus of right knee 03/19/2021  ? Unilateral primary osteoarthritis, right knee 03/19/2021  ? Acute pain of right knee 02/07/2021  ? Malaise and fatigue 02/07/2021  ? Prediabetes 10/19/2018  ? S/P lumbar fusion 09/16/2018  ? Asthma in adult, moderate persistent, uncomplicated 07/03/2016  ? Chronic pain of right ankle 03/02/2016  ? Allergic rhinitis 11/29/2015  ? Class 1 obesity due to excess calories with serious comorbidity and body mass index (BMI) of 32.0 to 32.9 in adult 11/29/2015  ? Benign positional vertigo 04/07/2015  ? Chronic bilateral low back pain with sciatica  04/07/2015  ? Common migraine 04/07/2015  ? Hyperlipidemia 04/07/2015  ? Essential hypertension 04/07/2015  ? Insomnia 04/07/2015  ? Obstructive sleep apnea 04/07/2015  ? Lumbar radiculopathy 04/07/2015  ? Anxiety 04/07/2015  ? ?Past Medical History:  ?Diagnosis Date  ? Asthma   ? GERD (gastroesophageal reflux disease)   ? Hyperlipidemia   ? Hypertension   ?  ?Family History  ?Problem Relation Age of Onset  ? Diabetes Mother   ? Kidney disease Mother   ? Cancer Father   ?     Kidney  ?  ?Past Surgical History:  ?Procedure Laterality Date  ? ANKLE SURGERY Right 2018  ? LUMBAR FUSION  2020  ? L4-L5  ? 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  ? Procedure: RIGHT TOTAL KNEE ARTHROPLASTY;  Surgeon: Nadara Mustard, MD;  Location: [facility] OR;  Service: Orthopedics;  Laterality: Right;  ? ?Social History  ? ?Occupational History  ? Not on file  ?Tobacco Use  ? Smoking status: Never  ? Smokeless tobacco: Never  ?Vaping Use  ? Vaping Use: Never used  ?Substance and Sexual Activity  ? Alcohol use: Yes  ?  Comment: socially  ? Drug use: Never  ? Sexual activity: Yes  ? ? ?

## 2021-07-23 ENCOUNTER — Ambulatory Visit (INDEPENDENT_AMBULATORY_CARE_PROVIDER_SITE_OTHER): Payer: PRIVATE HEALTH INSURANCE | Admitting: Family

## 2021-07-23 ENCOUNTER — Encounter: Payer: Self-pay | Admitting: Family

## 2021-07-23 DIAGNOSIS — Z96651 Presence of right artificial knee joint: Secondary | ICD-10-CM

## 2021-07-23 MED ORDER — OXYCODONE-ACETAMINOPHEN 5-325 MG PO TABS: 1.0000 | ORAL_TABLET | Freq: Three times a day (TID) | ORAL | 0 refills | Status: DC | PRN

## 2021-07-23 NOTE — Progress Notes (Signed)
   Post-Op Visit Note   Patient: Jessica Huff           Date of Birth: 04/15/1960           MRN: [id number] Visit Date: 07/23/2021 PCP: Haydee Salter, MD  Chief Complaint:  Chief Complaint  Patient presents with   Right Knee - Routine Post Op    06/25/21 right TKA    HPI:  HPI The patient is a 61 year old woman seen status post right total knee arthroplasty on the third she states today was the last day of her home health physical therapy. Ortho Exam On examination of the right knee her incision is well-healed she has mild edema.  There is flexion to 115 she lacks about 5 degrees of full extension.  Visit Diagnoses:  1. Status post total right knee replacement     Plan: We will begin outpatient physical therapy next week.  Pleased with her progress we will follow-up with her once more in 4 weeks  Follow-Up Instructions: No follow-ups on file.   Imaging: No results found.  Orders:  Orders Placed This Encounter  Procedures   Ambulatory referral to Physical Therapy   No orders of the defined types were placed in this encounter.    PMFS History: Patient Active Problem List   Diagnosis Date Noted   Total knee replacement status, right 06/25/2021   Tear of lateral meniscus of right knee 03/19/2021   Unilateral primary osteoarthritis, right knee 03/19/2021   Acute pain of right knee 02/07/2021   Malaise and fatigue 02/07/2021   Prediabetes 10/19/2018   S/P lumbar fusion 09/16/2018   Asthma in adult, moderate persistent, uncomplicated [id number]   Chronic pain of right ankle 03/02/2016   Allergic rhinitis 11/29/2015   Class 1 obesity due to excess calories with serious comorbidity and body mass index (BMI) of 32.0 to 32.9 in adult 11/29/2015   Benign positional vertigo 04/07/2015   Chronic bilateral low back pain with sciatica 04/07/2015   Common migraine 04/07/2015   Hyperlipidemia 04/07/2015   Essential hypertension 04/07/2015   Insomnia 04/07/2015   Obstructive  sleep apnea 04/07/2015   Lumbar radiculopathy 04/07/2015   Anxiety 04/07/2015   Past Medical History:  Diagnosis Date   Asthma    GERD (gastroesophageal reflux disease)    Hyperlipidemia    Hypertension     Family History  Problem Relation Age of Onset   Diabetes Mother    Kidney disease Mother    Cancer Father        Kidney    Past Surgical History:  Procedure Laterality Date   ANKLE SURGERY Right 2018   LUMBAR FUSION  2020   L4-L5   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   Procedure: RIGHT TOTAL KNEE ARTHROPLASTY;  Surgeon: Newt Minion, MD;  Location: [place];  Service: Orthopedics;  Laterality: Right;   Social History   Occupational History   Not on file  Tobacco Use   Smoking status: Never   Smokeless tobacco: Never  Vaping Use   Vaping Use: Never used  Substance and Sexual Activity   Alcohol use: Yes    Comment: socially   Drug use: Never   Sexual activity: Yes

## 2021-07-23 NOTE — Addendum Note (Signed)
Addended by: Barnie Del R on: 07/23/2021 02:42 PM   Modules accepted: Orders

## 2021-07-24 ENCOUNTER — Ambulatory Visit: Payer: PRIVATE HEALTH INSURANCE | Admitting: Physical Therapy

## 2021-07-24 ENCOUNTER — Encounter: Payer: Self-pay | Admitting: Physical Therapy

## 2021-07-24 DIAGNOSIS — R262 Difficulty in walking, not elsewhere classified: Secondary | ICD-10-CM

## 2021-07-24 DIAGNOSIS — M6281 Muscle weakness (generalized): Secondary | ICD-10-CM

## 2021-07-24 DIAGNOSIS — M25561 Pain in right knee: Secondary | ICD-10-CM | POA: Diagnosis not present

## 2021-07-24 DIAGNOSIS — R600 Localized edema: Secondary | ICD-10-CM

## 2021-07-24 NOTE — Therapy (Signed)
OUTPATIENT PHYSICAL THERAPY LOWER EXTREMITY EVALUATION   Patient Name: Jessica Huff MRN: [id number] DOB:10/26/1960, 61 y.o., female Today's Date: 07/24/2021   PT End of Session - 07/24/21 1258     Visit Number 1    Number of Visits 15    Date for PT Re-Evaluation 09/18/21    PT Start Time 1300    PT Stop Time 1345    PT Time Calculation (min) 45 min    Activity Tolerance Patient tolerated treatment well    Behavior During Therapy [facility] for tasks assessed/performed             Past Medical History:  Diagnosis Date   Asthma    GERD (gastroesophageal reflux disease)    Hyperlipidemia    Hypertension    Past Surgical History:  Procedure Laterality Date   ANKLE SURGERY Right 2018   LUMBAR FUSION  2020   L4-L5   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   Procedure: RIGHT TOTAL KNEE ARTHROPLASTY;  Surgeon: Nadara Mustard, MD;  Location: [facility] OR;  Service: Orthopedics;  Laterality: Right;   Patient Active Problem List   Diagnosis Date Noted   Total knee replacement status, right 06/25/2021   Tear of lateral meniscus of right knee 03/19/2021   Unilateral primary osteoarthritis, right knee 03/19/2021   Acute pain of right knee 02/07/2021   Malaise and fatigue 02/07/2021   Prediabetes 10/19/2018   S/P lumbar fusion 09/16/2018   Asthma in adult, moderate persistent, uncomplicated 07/03/2016   Chronic pain of right ankle 03/02/2016   Allergic rhinitis 11/29/2015   Class 1 obesity due to excess calories with serious comorbidity and body mass index (BMI) of 32.0 to 32.9 in adult 11/29/2015   Benign positional vertigo 04/07/2015   Chronic bilateral low back pain with sciatica 04/07/2015   Common migraine 04/07/2015   Hyperlipidemia 04/07/2015   Essential hypertension 04/07/2015   Insomnia 04/07/2015   Obstructive sleep apnea 04/07/2015   Lumbar radiculopathy 04/07/2015   Anxiety 04/07/2015    PCP: Loyola Mast, MD  REFERRING PROVIDER: Adonis Huguenin, NP  REFERRING  DIAG: [phone] (ICD-10-CM) - Status post total right knee replacement  THERAPY DIAG:  Acute pain of right knee  Difficulty in walking, not elsewhere classified  Muscle weakness (generalized)  Localized edema  Rationale for Evaluation and Treatment Rehabilitation  ONSET DATE: Rt TKA 06/25/21  SUBJECTIVE:   SUBJECTIVE STATEMENT: She had Rt TKA 06/25/21 and had HHPT until yesterday.   PERTINENT HISTORY:Rt TKA, lumbar fusion,asthma, chronic pain,vertigo, sciatica,insomnia,sleep apnea   PAIN:  Are you having pain? Yes: NPRS scale: 3 currently but can get up to 5/10 Pain location: medial and posterior Rt knee Pain description: sharp and throbbing Aggravating factors: straightening her knee, steps Relieving factors:    PRECAUTIONS: None  WEIGHT BEARING RESTRICTIONS No  FALLS:  Has patient fallen in last 6 months? No  LIVING ENVIRONMENT: Around 7 steps inside to bedroom  OCCUPATION: works for [employer] involving standing, walking, bending, lifting  PLOF: Independent  PATIENT GOALS : walk better   OBJECTIVE:   DIAGNOSTIC FINDINGS:   PATIENT SURVEYS:  07/24/21 FOTO 23% functional intake  COGNITION:  Overall cognitive status: Within functional limits for tasks assessed     SENSATION:   EDEMA:  07/24/21 Moderate edema in Rt knee, no other signs of infection noted  MUSCLE LENGTH: Tightness noted in quads and hamsrings on Rt  POSTURE:   PALPATION:   LOWER EXTREMITY ROM:   AROM/PROM Right eval Left  eval  Hip flexion    Hip extension    Hip abduction    Hip adduction    Hip internal rotation    Hip external rotation    Knee flexion 110/115   Knee extension 8/5   Ankle dorsiflexion    Ankle plantarflexion    Ankle inversion    Ankle eversion     (Blank rows = not tested)  LOWER EXTREMITY MMT:  MMT in sitting Right eval Left eval  Hip flexion 3+   Hip extension    Hip abduction 4   Hip adduction    Hip internal rotation     Hip external rotation    Knee flexion 4 5  Knee extension 3+ 5  Ankle dorsiflexion    Ankle plantarflexion    Ankle inversion    Ankle eversion     (Blank rows = not tested)  LOWER EXTREMITY SPECIAL TESTS:    FUNCTIONAL TESTS:    GAIT: 07/24/21 Distance walked: ambulates in with RW that was too short and was properly adjusted for her. She was then able to progress to [facility] 25 feet X 2 with cues and demo for technique. Close supervision needed.     TODAY'S TREATMENT: Performed HEP listed below Manual therapy 8 min for Rt knee PROM and manual hamstring stretching Nu step X 5 min L5 UE/LE   PATIENT EDUCATION:  Education details: HEP, PT plan of care Person educated: Patient Education method: Explanation, Demonstration, Verbal cues, and Handouts Education comprehension: verbalized understanding, returned demonstration, and needs further education   HOME EXERCISE PROGRAM: Access Code: [email or web address] URL: [email or web address] Date: 07/24/2021 Prepared by: Ivery Quale  Exercises - Quad Setting and Stretching  - 2 x daily - 6 x weekly - 2 sets - 10 reps - Seated Knee Flexion Stretch  - 2 x daily - 6 x weekly - 1-2 sets - 10 reps - 5 sec hold - Seated Hamstring Stretch  - 2 x daily - 6 x weekly - 1 sets - 3 reps - 30 hold - Seated Straight Leg Heel Taps  - 2 x daily - 6 x weekly - 3 sets - 5 reps - Mini Squat with Counter Support  - 2 x daily - 6 x weekly - 1-2 sets - 10 reps   ASSESSMENT:  CLINICAL IMPRESSION: Patient referred to PT S/P Rt TKA on 06/25/21 Patient will benefit from skilled PT to address below impairments, limitations and improve overall function.  OBJECTIVE IMPAIRMENTS: decreased activity tolerance, difficulty walking, decreased balance, decreased endurance, decreased mobility, decreased ROM, decreased strength, impaired flexibility, impaired LE use, postural dysfunction, and pain.  ACTIVITY LIMITATIONS: bending, lifting, carry, locomotion,  cleaning, community activity, driving, and or occupation  PERSONAL FACTORS:  Rt TKA, lumbar fusion,asthma, chronic pain,vertigo, sciatica,insomnia,sleep apnea are also affecting patient's functional outcome.  REHAB POTENTIAL: Good  CLINICAL DECISION MAKING: Stable/uncomplicated  EVALUATION COMPLEXITY: Low    GOALS: Short term PT Goals Target date: 08/21/2021 Pt will be I and compliant with HEP. Baseline:  Goal status: New Pt will improve gait from RW to [facility] no more than supervision for 300 feet Baseline: Goal status: New  Long term PT goals Target date: 09/18/2021 Pt will improve Rt knee PROM 0-120 to improve functional mobility Baseline: Goal status: New Pt will improve  Rt hip/knee strength to at least 5-/5 MMT to improve functional strength Baseline: Goal status: New Pt will improve FOTO to at least 49% functional to show improved function Baseline: Goal status: New Pt will  reduce pain to overall less than 2-3/10 with usual activity and work activity. Baseline: Goal status: New Pt will be able to ambulate community distances independently at least 750 ft WNL gait pattern without complaints Baseline: Goal status: New  PLAN: PT FREQUENCY: 1-3 times per week   PT DURATION: 6-8 weeks  PLANNED INTERVENTIONS (unless contraindicated): aquatic PT, Canalith repositioning, cryotherapy, Electrical stimulation, Iontophoresis with 4 mg/ml dexamethasome, Moist heat, traction, Ultrasound, gait training, Therapeutic exercise, balance training, neuromuscular re-education, patient/family education, prosthetic training, manual techniques, passive ROM, dry needling, taping, vasopnuematic device, vestibular, spinal manipulations, joint manipulations  PLAN FOR NEXT SESSION: knee extension ROM emphasis and quad strength to tolerance, progress [facility] as able.    April MansonBrian R Raina Sole, PT,DPT 07/24/2021, 12:59 PM

## 2021-07-25 ENCOUNTER — Telehealth: Payer: Self-pay

## 2021-07-25 ENCOUNTER — Ambulatory Visit: Payer: PRIVATE HEALTH INSURANCE | Admitting: Physical Therapy

## 2021-07-25 NOTE — Telephone Encounter (Signed)
Pt required prior auth for Oxycodone 5/325. Called [phone] [employer] rx approval obtained from today's date until 01/24/2022 qty # 60 for 30 days called pharm lm on vm to advise prior auth# is [id number]

## 2021-07-28 ENCOUNTER — Encounter: Payer: Self-pay | Admitting: Physical Therapy

## 2021-07-28 ENCOUNTER — Ambulatory Visit (INDEPENDENT_AMBULATORY_CARE_PROVIDER_SITE_OTHER): Payer: PRIVATE HEALTH INSURANCE | Admitting: Physical Therapy

## 2021-07-28 ENCOUNTER — Other Ambulatory Visit: Payer: Self-pay | Admitting: Family Medicine

## 2021-07-28 DIAGNOSIS — M6281 Muscle weakness (generalized): Secondary | ICD-10-CM

## 2021-07-28 DIAGNOSIS — M25561 Pain in right knee: Secondary | ICD-10-CM | POA: Diagnosis not present

## 2021-07-28 DIAGNOSIS — E785 Hyperlipidemia, unspecified: Secondary | ICD-10-CM

## 2021-07-28 DIAGNOSIS — R600 Localized edema: Secondary | ICD-10-CM | POA: Diagnosis not present

## 2021-07-28 DIAGNOSIS — I10 Essential (primary) hypertension: Secondary | ICD-10-CM

## 2021-07-28 DIAGNOSIS — R262 Difficulty in walking, not elsewhere classified: Secondary | ICD-10-CM | POA: Diagnosis not present

## 2021-07-28 NOTE — Therapy (Signed)
OUTPATIENT PHYSICAL THERAPY TREATMENT NOTE   Patient Name: Jessica GroomRhonda Huff MRN: [id number] DOB:06/25/1960, 61 y.o., female Today's Date: 6/5/2023a  END OF SESSION:   PT End of Session - 07/28/21 0944     Visit Number 2    Number of Visits 15    Date for PT Re-Evaluation 09/18/21    PT Start Time 0930    PT Stop Time 1023    PT Time Calculation (min) 53 min    Activity Tolerance Patient tolerated treatment well    Behavior During Therapy [facility] for tasks assessed/performed             Past Medical History:  Diagnosis Date   Asthma    GERD (gastroesophageal reflux disease)    Hyperlipidemia    Hypertension    Past Surgical History:  Procedure Laterality Date   ANKLE SURGERY Right 2018   LUMBAR FUSION  2020   L4-L5   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   Procedure: RIGHT TOTAL KNEE ARTHROPLASTY;  Surgeon: Nadara Mustarduda, Marcus V, MD;  Location: [facility] OR;  Service: Orthopedics;  Laterality: Right;   Patient Active Problem List   Diagnosis Date Noted   Total knee replacement status, right 06/25/2021   Tear of lateral meniscus of right knee 03/19/2021   Unilateral primary osteoarthritis, right knee 03/19/2021   Acute pain of right knee 02/07/2021   Malaise and fatigue 02/07/2021   Prediabetes 10/19/2018   S/P lumbar fusion 09/16/2018   Asthma in adult, moderate persistent, uncomplicated 07/03/2016   Chronic pain of right ankle 03/02/2016   Allergic rhinitis 11/29/2015   Class 1 obesity due to excess calories with serious comorbidity and body mass index (BMI) of 32.0 to 32.9 in adult 11/29/2015   Benign positional vertigo 04/07/2015   Chronic bilateral low back pain with sciatica 04/07/2015   Common migraine 04/07/2015   Hyperlipidemia 04/07/2015   Essential hypertension 04/07/2015   Insomnia 04/07/2015   Obstructive sleep apnea 04/07/2015   Lumbar radiculopathy 04/07/2015   Anxiety 04/07/2015     THERAPY DIAG:  Acute pain of right knee  Difficulty in walking, not  elsewhere classified  Muscle weakness (generalized)  Localized edema    PCP: Loyola Mastudd, Stephen M, MD   REFERRING PROVIDER: Adonis HugueninZamora, Erin R, NP   REFERRING DIAG: [phone] (ICD-10-CM) - Status post total right knee replacement   THERAPY DIAG:  Acute pain of right knee   Difficulty in walking, not elsewhere classified   Muscle weakness (generalized)   Localized edema   Rationale for Evaluation and Treatment Rehabilitation   ONSET DATE: Rt TKA 06/25/21   SUBJECTIVE:    SUBJECTIVE STATEMENT: She states insurance does not cover her pain meds so she only has one left and has been trying to use tylenol.   PERTINENT HISTORY:Rt TKA, lumbar fusion,asthma, chronic pain,vertigo, sciatica,insomnia,sleep apnea     PAIN:  Are you having pain? Yes: 5/10 Pain location: medial and posterior Rt knee Pain description: sharp and throbbing Aggravating factors: straightening her knee, steps Relieving factors:     PRECAUTIONS: None   WEIGHT BEARING RESTRICTIONS No   FALLS:  Has patient fallen in last 6 months? No   LIVING ENVIRONMENT: Around 7 steps inside to bedroom   OCCUPATION: works for [employer] involving standing, walking, bending, lifting   PLOF: Independent   PATIENT GOALS : walk better     OBJECTIVE:    DIAGNOSTIC FINDINGS:    PATIENT SURVEYS:  07/24/21 FOTO 23% functional intake   COGNITION:  Overall cognitive status: Within functional limits for tasks assessed                          SENSATION:     EDEMA:  07/24/21 Moderate edema in Rt knee, no other signs of infection noted   MUSCLE LENGTH: Tightness noted in quads and hamsrings on Rt   POSTURE:    PALPATION:     LOWER EXTREMITY ROM:    AROM/PROM Right eval Left eval  Hip flexion      Hip extension      Hip abduction      Hip adduction      Hip internal rotation      Hip external rotation      Knee flexion 110/115    Knee extension 8/5    Ankle dorsiflexion       Ankle plantarflexion      Ankle inversion      Ankle eversion       (Blank rows = not tested)   LOWER EXTREMITY MMT:   MMT in sitting Right eval Left eval  Hip flexion 3+    Hip extension      Hip abduction 4    Hip adduction      Hip internal rotation      Hip external rotation      Knee flexion 4 5  Knee extension 3+ 5  Ankle dorsiflexion      Ankle plantarflexion      Ankle inversion      Ankle eversion       (Blank rows = not tested)   LOWER EXTREMITY SPECIAL TESTS:      FUNCTIONAL TESTS:      GAIT: 07/24/21 Distance walked: ambulates in with RW that was too short and was properly adjusted for her. She was then able to progress to [facility] 25 feet X 2 with cues and demo for technique. Close supervision needed. 07/28/21 Arrives with new front wheel RW that was also too short for her so this was adjusted to proper height and tennis balls added. Then Gait with SPC 150 feet with supervision and cues/demo for technique         TODAY'S TREATMENT:  07/28/21 Nu step X 9 min L5 UE/LE seat # 13 Leg press machine DL 63# [id number] with focus on Rt knee extension Rt hamstring stretch at leg press 1 minute X 3 Slantboard stretch 1 minute X3 Heel and toe raises X 15 bilat Gait, see above for details Seated knee flexion stretch AAROM 10 sec X 10 Seated LAQ 3# X 20 on Rt Seated SLR 2X10 on Rt Rt heel prop for knee extension stretch 3 min  Manual therapy: Rt knee PROM  Modalities: Vasopnuematic to Rt knee X 10 min, medium compression 34 deg        PATIENT EDUCATION:  Education details: HEP, PT plan of care Person educated: Patient Education method: Explanation, Demonstration, Verbal cues, and Handouts Education comprehension: verbalized understanding, returned demonstration, and needs further education     HOME EXERCISE PROGRAM: Access Code: [email or web address] URL: [email or web address] Date: 07/24/2021 Prepared by: Ivery Quale   Exercises - Quad Setting and  Stretching  - 2 x daily - 6 x weekly - 2 sets - 10 reps - Seated Knee Flexion Stretch  - 2 x daily - 6 x weekly - 1-2 sets - 10 reps - 5 sec hold - Seated Hamstring Stretch  - 2 x daily - 6 x  weekly - 1 sets - 3 reps - 30 hold - Seated Straight Leg Heel Taps  - 2 x daily - 6 x weekly - 3 sets - 5 reps - Mini Squat with Counter Support  - 2 x daily - 6 x weekly - 1-2 sets - 10 reps     ASSESSMENT:   CLINICAL IMPRESSION: Session focused on HEP review, gait, and exercises to progress her Rt knee ROM and strength to her overall tolerance.    OBJECTIVE IMPAIRMENTS: decreased activity tolerance, difficulty walking, decreased balance, decreased endurance, decreased mobility, decreased ROM, decreased strength, impaired flexibility, impaired LE use, postural dysfunction, and pain.   ACTIVITY LIMITATIONS: bending, lifting, carry, locomotion, cleaning, community activity, driving, and or occupation   PERSONAL FACTORS:  Rt TKA, lumbar fusion,asthma, chronic pain,vertigo, sciatica,insomnia,sleep apnea are also affecting patient's functional outcome.   REHAB POTENTIAL: Good   CLINICAL DECISION MAKING: Stable/uncomplicated   EVALUATION COMPLEXITY: Low       GOALS: Short term PT Goals Target date: 08/21/2021 Pt will be I and compliant with HEP. Baseline:  Goal status: New Pt will improve gait from RW to [facility] no more than supervision for 300 feet Baseline: Goal status: New   Long term PT goals Target date: 09/18/2021 Pt will improve Rt knee PROM 0-120 to improve functional mobility Baseline: Goal status: New Pt will improve  Rt hip/knee strength to at least 5-/5 MMT to improve functional strength Baseline: Goal status: New Pt will improve FOTO to at least 49% functional to show improved function Baseline: Goal status: New Pt will reduce pain to overall less than 2-3/10 with usual activity and work activity. Baseline: Goal status: New Pt will be able to ambulate community distances  independently at least 750 ft WNL gait pattern without complaints Baseline: Goal status: New   PLAN: PT FREQUENCY: 1-3 times per week    PT DURATION: 6-8 weeks   PLANNED INTERVENTIONS (unless contraindicated): aquatic PT, Canalith repositioning, cryotherapy, Electrical stimulation, Iontophoresis with 4 mg/ml dexamethasome, Moist heat, traction, Ultrasound, gait training, Therapeutic exercise, balance training, neuromuscular re-education, patient/family education, prosthetic training, manual techniques, passive ROM, dry needling, taping, vasopnuematic device, vestibular, spinal manipulations, joint manipulations   PLAN FOR NEXT SESSION: knee extension ROM emphasis and quad strength to tolerance, progress gait from RW to [facility] as able.     April Manson, PT,DPT 07/28/2021, 10:17 AM

## 2021-07-30 ENCOUNTER — Encounter: Payer: Self-pay | Admitting: Physical Therapy

## 2021-07-30 ENCOUNTER — Ambulatory Visit (INDEPENDENT_AMBULATORY_CARE_PROVIDER_SITE_OTHER): Payer: PRIVATE HEALTH INSURANCE | Admitting: Physical Therapy

## 2021-07-30 DIAGNOSIS — M25561 Pain in right knee: Secondary | ICD-10-CM | POA: Diagnosis not present

## 2021-07-30 DIAGNOSIS — R262 Difficulty in walking, not elsewhere classified: Secondary | ICD-10-CM | POA: Diagnosis not present

## 2021-07-30 DIAGNOSIS — M6281 Muscle weakness (generalized): Secondary | ICD-10-CM | POA: Diagnosis not present

## 2021-07-30 DIAGNOSIS — R600 Localized edema: Secondary | ICD-10-CM

## 2021-07-30 MED ORDER — OXYCODONE-ACETAMINOPHEN 5-325 MG PO TABS: 1.0000 | ORAL_TABLET | Freq: Three times a day (TID) | ORAL | 0 refills | Status: DC | PRN

## 2021-07-30 NOTE — Therapy (Signed)
OUTPATIENT PHYSICAL THERAPY TREATMENT NOTE   Patient Name: Jessica GroomRhonda Lemere MRN: [id number] DOB:03/20/1960, 61 y.o.,, female Today's Date: 6/7/2023a  END OF SESSION:   PT End of Session - 07/30/21 0955     Visit Number 3    Number of Visits 15    Date for PT Re-Evaluation 09/18/21    PT Start Time 0930    PT Stop Time 1018    PT Time Calculation (min) 48 min    Activity Tolerance Patient tolerated treatment well    Behavior During Therapy [facility] for tasks assessed/performed             Past Medical History:  Diagnosis Date   Asthma    GERD (gastroesophageal reflux disease)    Hyperlipidemia    Hypertension    Past Surgical History:  Procedure Laterality Date   ANKLE SURGERY Right 2018   LUMBAR FUSION  2020   L4-L5   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   Procedure: RIGHT TOTAL KNEE ARTHROPLASTY;  Surgeon: Nadara Mustarduda, Marcus V, MD;  Location: [facility] OR;  Service: Orthopedics;  Laterality: Right;   Patient Active Problem List   Diagnosis Date Noted   Total knee replacement status, right 06/25/2021   Tear of lateral meniscus of right knee 03/19/2021   Unilateral primary osteoarthritis, right knee 03/19/2021   Acute pain of right knee 02/07/2021   Malaise and fatigue 02/07/2021   Prediabetes 10/19/2018   S/P lumbar fusion 09/16/2018   Asthma in adult, moderate persistent, uncomplicated 07/03/2016   Chronic pain of right ankle 03/02/2016   Allergic rhinitis 11/29/2015   Class 1 obesity due to excess calories with serious comorbidity and body mass index (BMI) of 32.0 to 32.9 in adult 11/29/2015   Benign positional vertigo 04/07/2015   Chronic bilateral low back pain with sciatica 04/07/2015   Common migraine 04/07/2015   Hyperlipidemia 04/07/2015   Essential hypertension 04/07/2015   Insomnia 04/07/2015   Obstructive sleep apnea 04/07/2015   Lumbar radiculopathy 04/07/2015   Anxiety 04/07/2015     THERAPY DIAG:  Acute pain of right knee  Difficulty in walking, not  elsewhere classified  Muscle weakness (generalized)  Localized edema    PCP: Loyola Mastudd, Stephen M, MD   REFERRING PROVIDER: Adonis HugueninZamora, Erin R, NP   REFERRING DIAG: [phone] (ICD-10-CM) - Status post total right knee replacement   THERAPY DIAG:  Acute pain of right knee   Difficulty in walking, not elsewhere classified   Muscle weakness (generalized)   Localized edema   Rationale for Evaluation and Treatment Rehabilitation   ONSET DATE: Rt TKA 06/25/21   SUBJECTIVE:    SUBJECTIVE STATEMENT: She states she was sore after last session but does not feel she is being worked to hard in PT and wants to continue to progress as tolerated.   PERTINENT HISTORY:Rt TKA, lumbar fusion,asthma, chronic pain,vertigo, sciatica,insomnia,sleep apnea     PAIN:  Are you having pain? Yes: 5/10 Pain location: medial and posterior Rt knee Pain description: sharp and throbbing Aggravating factors: straightening her knee, steps Relieving factors:     PRECAUTIONS: None   WEIGHT BEARING RESTRICTIONS No   FALLS:  Has patient fallen in last 6 months? No   LIVING ENVIRONMENT: Around 7 steps inside to bedroom   OCCUPATION: works for [employer] involving standing, walking, bending, lifting   PLOF: Independent   PATIENT GOALS : walk better     OBJECTIVE:    DIAGNOSTIC FINDINGS:    PATIENT SURVEYS:  07/24/21 FOTO 23% functional  intake   COGNITION:           Overall cognitive status: Within functional limits for tasks assessed                          SENSATION:     EDEMA:  07/24/21 Moderate edema in Rt knee, no other signs of infection noted   MUSCLE LENGTH: Tightness noted in quads and hamsrings on Rt   POSTURE:    PALPATION:     LOWER EXTREMITY ROM:    AROM/PROM Right eval Left eval  Hip flexion      Hip extension      Hip abduction      Hip adduction      Hip internal rotation      Hip external rotation      Knee flexion 110/115    Knee extension 8/5     Ankle dorsiflexion      Ankle plantarflexion      Ankle inversion      Ankle eversion       (Blank rows = not tested)   LOWER EXTREMITY MMT:   MMT in sitting Right eval Left eval  Hip flexion 3+    Hip extension      Hip abduction 4    Hip adduction      Hip internal rotation      Hip external rotation      Knee flexion 4 5  Knee extension 3+ 5  Ankle dorsiflexion      Ankle plantarflexion      Ankle inversion      Ankle eversion       (Blank rows = not tested)   LOWER EXTREMITY SPECIAL TESTS:      FUNCTIONAL TESTS:      GAIT: 07/24/21 Distance walked: ambulates in with RW that was too short and was properly adjusted for her. She was then able to progress to [facility] 25 feet X 2 with cues and demo for technique. Close supervision needed. 07/28/21 Arrives with new front wheel RW that was also too short for her so this was adjusted to proper height and tennis balls added. Then Gait with SPC 150 feet with supervision and cues/demo for technique 07/30/21 Gait with SPC 150 feet with supervision and cues/demo for step through pattern vs her preferred step to pattern         TODAY'S TREATMENT: 07/30/21 Sci fit bike X 6 min L5 Leg press machine DL 19# [id number]  then Rt leg only 12# 2X10 with focus on Rt knee extension Rt hamstring stretch seated  minute X 3 Slantboard stretch 30 sec X3 Heel and toe raises 2X15 bilat Gait, see above for details Seated knee flexion stretch AAROM 10 sec X 10 Seated LAQ 2.5# 3X10 on Rt Seated SLR 2X10 on Rt Rt heel prop for knee extension stretch 5 min  Manual therapy: Rt knee PROM, extension mobs grade 2, passive hamstring stretching  Modalities: Vasopnuematic to Rt knee X 10 min, medium compression 34 deg   07/28/21 Nu step X 9 min L5 UE/LE seat # 13 Leg press machine DL 08# [id number] with focus on Rt knee extension Rt hamstring stretch at leg press 1 minute X 3 Slantboard stretch 1 minute X3 Heel and toe raises X 15 bilat Gait, see above for  details Seated knee flexion stretch AAROM 10 sec X 10 Seated LAQ 3# X 20 on Rt Seated SLR 2X10 on Rt Rt heel  prop for knee extension stretch 3 min  Manual therapy: Rt knee PROM  Modalities: Vasopnuematic to Rt knee X 10 min, medium compression 34 deg        PATIENT EDUCATION:  Education details: HEP, PT plan of care Person educated: Patient Education method: Explanation, Demonstration, Verbal cues, and Handouts Education comprehension: verbalized understanding, returned demonstration, and needs further education     HOME EXERCISE PROGRAM: Access Code: [email or web address] URL: [email or web address] Date: 07/24/2021 Prepared by: Ivery Quale   Exercises - Quad Setting and Stretching  - 2 x daily - 6 x weekly - 2 sets - 10 reps - Seated Knee Flexion Stretch  - 2 x daily - 6 x weekly - 1-2 sets - 10 reps - 5 sec hold - Seated Hamstring Stretch  - 2 x daily - 6 x weekly - 1 sets - 3 reps - 30 hold - Seated Straight Leg Heel Taps  - 2 x daily - 6 x weekly - 3 sets - 5 reps - Mini Squat with Counter Support  - 2 x daily - 6 x weekly - 1-2 sets - 10 reps     ASSESSMENT:   CLINICAL IMPRESSION: She is overall improving with strength and her knee flexion ROM is coming along great. She does still lack some knee extension ROM which was focus today.    OBJECTIVE IMPAIRMENTS: decreased activity tolerance, difficulty walking, decreased balance, decreased endurance, decreased mobility, decreased ROM, decreased strength, impaired flexibility, impaired LE use, postural dysfunction, and pain.   ACTIVITY LIMITATIONS: bending, lifting, carry, locomotion, cleaning, community activity, driving, and or occupation   PERSONAL FACTORS:  Rt TKA, lumbar fusion,asthma, chronic pain,vertigo, sciatica,insomnia,sleep apnea are also affecting patient's functional outcome.   REHAB POTENTIAL: Good   CLINICAL DECISION MAKING: Stable/uncomplicated   EVALUATION COMPLEXITY: Low       GOALS: Short  term PT Goals Target date: 08/21/2021 Pt will be I and compliant with HEP. Baseline:  Goal status: New Pt will improve gait from RW to [facility] no more than supervision for 300 feet Baseline: Goal status: New   Long term PT goals Target date: 09/18/2021 Pt will improve Rt knee PROM 0-120 to improve functional mobility Baseline: Goal status: New Pt will improve  Rt hip/knee strength to at least 5-/5 MMT to improve functional strength Baseline: Goal status: New Pt will improve FOTO to at least 49% functional to show improved function Baseline: Goal status: New Pt will reduce pain to overall less than 2-3/10 with usual activity and work activity. Baseline: Goal status: New Pt will be able to ambulate community distances independently at least 750 ft WNL gait pattern without complaints Baseline: Goal status: New   PLAN: PT FREQUENCY: 1-3 times per week    PT DURATION: 6-8 weeks   PLANNED INTERVENTIONS (unless contraindicated): aquatic PT, Canalith repositioning, cryotherapy, Electrical stimulation, Iontophoresis with 4 mg/ml dexamethasome, Moist heat, traction, Ultrasound, gait training, Therapeutic exercise, balance training, neuromuscular re-education, patient/family education, prosthetic training, manual techniques, passive ROM, dry needling, taping, vasopnuematic device, vestibular, spinal manipulations, joint manipulations   PLAN FOR NEXT SESSION: knee extension ROM emphasis and quad strength to tolerance, progress gait from RW to [facility] as able.     April Manson, PT,DPT 07/30/2021, 9:57 AM

## 2021-08-01 ENCOUNTER — Encounter: Payer: PRIVATE HEALTH INSURANCE | Admitting: Physical Medicine and Rehabilitation

## 2021-08-04 ENCOUNTER — Encounter: Payer: Self-pay | Admitting: Physical Therapy

## 2021-08-04 ENCOUNTER — Ambulatory Visit (INDEPENDENT_AMBULATORY_CARE_PROVIDER_SITE_OTHER): Payer: PRIVATE HEALTH INSURANCE | Admitting: Physical Therapy

## 2021-08-04 DIAGNOSIS — M25561 Pain in right knee: Secondary | ICD-10-CM | POA: Diagnosis not present

## 2021-08-04 DIAGNOSIS — R262 Difficulty in walking, not elsewhere classified: Secondary | ICD-10-CM | POA: Diagnosis not present

## 2021-08-04 DIAGNOSIS — M6281 Muscle weakness (generalized): Secondary | ICD-10-CM | POA: Diagnosis not present

## 2021-08-04 DIAGNOSIS — R600 Localized edema: Secondary | ICD-10-CM

## 2021-08-04 NOTE — Therapy (Signed)
OUTPATIENT PHYSICAL THERAPY TREATMENT NOTE   Patient Name: Jessica Huff MRN: [id number] DOB:1960-04-07, 61 y.o., female Today's Date: 6/12/2023a  END OF SESSION:   PT End of Session - 08/04/21 1120     Visit Number 4    Number of Visits 15    Date for PT Re-Evaluation 09/18/21    PT Start Time 1100    PT Stop Time 1148    PT Time Calculation (min) 48 min    Activity Tolerance Patient tolerated treatment well    Behavior During Therapy WFL for tasks assessed/performed             Past Medical History:  Diagnosis Date   Asthma    GERD (gastroesophageal reflux disease)    Hyperlipidemia    Hypertension    Past Surgical History:  Procedure Laterality Date   ANKLE SURGERY Right 2018   LUMBAR FUSION  2020   L4-L5   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   Procedure: RIGHT TOTAL KNEE ARTHROPLASTY;  Surgeon: Newt Minion, MD;  Location: [place];  Service: Orthopedics;  Laterality: Right;   Patient Active Problem List   Diagnosis Date Noted   Total knee replacement status, right 06/25/2021   Tear of lateral meniscus of right knee 03/19/2021   Unilateral primary osteoarthritis, right knee 03/19/2021   Acute pain of right knee 02/07/2021   Malaise and fatigue 02/07/2021   Prediabetes 10/19/2018   S/P lumbar fusion 09/16/2018   Asthma in adult, moderate persistent, uncomplicated 20/80/2233   Chronic pain of right ankle 03/02/2016   Allergic rhinitis 11/29/2015   Class 1 obesity due to excess calories with serious comorbidity and body mass index (BMI) of 32.0 to 32.9 in adult 11/29/2015   Benign positional vertigo 04/07/2015   Chronic bilateral low back pain with sciatica 04/07/2015   Common migraine 04/07/2015   Hyperlipidemia 04/07/2015   Essential hypertension 04/07/2015   Insomnia 04/07/2015   Obstructive sleep apnea 04/07/2015   Lumbar radiculopathy 04/07/2015   Anxiety 04/07/2015     THERAPY DIAG:  Acute pain of right knee  Difficulty in walking, not  elsewhere classified  Muscle weakness (generalized)  Localized edema    PCP: Haydee Salter, MD   REFERRING PROVIDER: Suzan Slick, NP   REFERRING DIAG: [phone] (ICD-10-CM) - Status post total right knee replacement   THERAPY DIAG:  Acute pain of right knee   Difficulty in walking, not elsewhere classified   Muscle weakness (generalized)   Localized edema   Rationale for Evaluation and Treatment Rehabilitation   ONSET DATE: Rt TKA 06/25/21   SUBJECTIVE:    SUBJECTIVE STATEMENT: She states the pain is not too bad today upon arrival PERTINENT HISTORY:Rt TKA, lumbar fusion,asthma, chronic pain,vertigo, sciatica,insomnia,sleep apnea     PAIN:  Are you having pain? Yes: 2/10 Pain location: medial and posterior Rt knee Pain description: sharp and throbbing Aggravating factors: straightening her knee, steps Relieving factors:     PRECAUTIONS: None   WEIGHT BEARING RESTRICTIONS No   FALLS:  Has patient fallen in last 6 months? No   LIVING ENVIRONMENT: Around 7 steps inside to bedroom   OCCUPATION: works for [name] involving standing, walking, bending, lifting   PLOF: Independent   PATIENT GOALS : walk better     OBJECTIVE:    DIAGNOSTIC FINDINGS:    PATIENT SURVEYS:  07/24/21 FOTO 23% functional intake   COGNITION:           Overall cognitive status: Within functional  limits for tasks assessed                          SENSATION:     EDEMA:  07/24/21 Moderate edema in Rt knee, no other signs of infection noted   MUSCLE LENGTH: Tightness noted in quads and hamsrings on Rt   POSTURE:    PALPATION:     LOWER EXTREMITY ROM:    AROM/PROM Right eval Right  6/01/25/22  Hip flexion      Hip extension      Hip abduction      Hip adduction      Hip internal rotation      Hip external rotation      Knee flexion 110/115  115/120  Knee extension 8/5  6/4  Ankle dorsiflexion      Ankle plantarflexion      Ankle inversion       Ankle eversion       (Blank rows = not tested)   LOWER EXTREMITY MMT:   MMT in sitting Right eval Left eval  Hip flexion 3+    Hip extension      Hip abduction 4    Hip adduction      Hip internal rotation      Hip external rotation      Knee flexion 4 5  Knee extension 3+ 5  Ankle dorsiflexion      Ankle plantarflexion      Ankle inversion      Ankle eversion       (Blank rows = not tested)   LOWER EXTREMITY SPECIAL TESTS:      FUNCTIONAL TESTS:      GAIT: 07/24/21 Distance walked: ambulates in with RW that was too short and was properly adjusted for her. She was then able to progress to [facility] 25 feet X 2 with cues and demo for technique. Close supervision needed. 07/28/21 Arrives with new front wheel RW that was also too short for her so this was adjusted to proper height and tennis balls added. Then Gait with SPC 150 feet with supervision and cues/demo for technique 07/30/21 Gait with SPC 150 feet with supervision and cues/demo for step through pattern vs her preferred step to pattern         TODAY'S TREATMENT: 08/04/21 Sci fit bike X 6 min L5 Rt hamstring stretch seated 1 minute X 3 Slantboard stretch 30 sec X3 Heel and toe raises 2X15 bilat Seated knee flexion stretch AAROM 10 sec X 10 Seated LAQ 3# 2X15 on Rt Seated SLR 3X10 on Rt Rt heel prop for knee extension stretch 3 min  Manual therapy: Rt knee PROM, extension mobs grade 2-3, passive hamstring stretching  Modalities: Vasopnuematic to Rt knee X 10 min, medium compression 34 deg  07/30/21 Sci fit bike X 6 min L5 Leg press machine DL 50# 2X10  then Rt leg only 12# 2X10 with focus on Rt knee extension Rt hamstring stretch seated  minute X 3 Slantboard stretch 30 sec X3 Heel and toe raises 2X15 bilat Gait, see above for details Seated knee flexion stretch AAROM 10 sec X 10 Seated LAQ 2.5# 3X10 on Rt Seated SLR 2X10 on Rt Rt heel prop for knee extension stretch 5 min  Manual therapy: Rt knee PROM,  extension mobs grade 2, passive hamstring stretching  Modalities: Vasopnuematic to Rt knee X 10 min, medium compression 34 deg         PATIENT EDUCATION:  Education details: HEP, PT plan of care Person educated: Patient Education method: Explanation, Demonstration, Verbal cues, and Handouts Education comprehension: verbalized understanding, returned demonstration, and needs further education     HOME EXERCISE PROGRAM: Access Code: [email or web address] URL: [email or web address] Date: 07/24/2021 Prepared by: Elsie Ra   Exercises - Quad Setting and Stretching  - 2 x daily - 6 x weekly - 2 sets - 10 reps - Seated Knee Flexion Stretch  - 2 x daily - 6 x weekly - 1-2 sets - 10 reps - 5 sec hold - Seated Hamstring Stretch  - 2 x daily - 6 x weekly - 1 sets - 3 reps - 30 hold - Seated Straight Leg Heel Taps  - 2 x daily - 6 x weekly - 3 sets - 5 reps - Mini Squat with Counter Support  - 2 x daily - 6 x weekly - 1-2 sets - 10 reps     ASSESSMENT:   CLINICAL IMPRESSION: She has now met her goal for knee flexion ROM, but still missing some knee extension ROM which was focus of session. PT recommending to continue with current PT plan of care.   OBJECTIVE IMPAIRMENTS: decreased activity tolerance, difficulty walking, decreased balance, decreased endurance, decreased mobility, decreased ROM, decreased strength, impaired flexibility, impaired LE use, postural dysfunction, and pain.   ACTIVITY LIMITATIONS: bending, lifting, carry, locomotion, cleaning, community activity, driving, and or occupation   PERSONAL FACTORS:  Rt TKA, lumbar fusion,asthma, chronic pain,vertigo, sciatica,insomnia,sleep apnea are also affecting patient's functional outcome.   REHAB POTENTIAL: Good   CLINICAL DECISION MAKING: Stable/uncomplicated   EVALUATION COMPLEXITY: Low       GOALS: Short term PT Goals Target date: 08/21/2021 Pt will be I and compliant with HEP. Baseline:  Goal status:  ongoing Pt will improve gait from RW to [facility] no more than supervision for 300 feet Baseline: Goal status:ongoing, can go 150 feet as of 07/30/21   Long term PT goals Target date: 09/18/2021 Pt will improve Rt knee PROM 0-120 to improve functional mobility Baseline: Goal status: ongoing, 4-120 on 08/04/21 Pt will improve  Rt hip/knee strength to at least 5-/5 MMT to improve functional strength Baseline: Goal status: ongoing Pt will improve FOTO to at least 49% functional to show improved function Baseline: Goal status: ongoing Pt will reduce pain to overall less than 2-3/10 with usual activity and work activity. Baseline: Goal status: ongoing Pt will be able to ambulate community distances independently at least 750 ft WNL gait pattern without complaints Baseline: Goal status: ongoing   PLAN: PT FREQUENCY: 1-3 times per week    PT DURATION: 6-8 weeks   PLANNED INTERVENTIONS (unless contraindicated): aquatic PT, Canalith repositioning, cryotherapy, Electrical stimulation, Iontophoresis with 4 mg/ml dexamethasome, Moist heat, traction, Ultrasound, gait training, Therapeutic exercise, balance training, neuromuscular re-education, patient/family education, prosthetic training, manual techniques, passive ROM, dry needling, taping, vasopnuematic device, vestibular, spinal manipulations, joint manipulations   PLAN FOR NEXT SESSION: knee extension ROM emphasis and quad strength to tolerance, progress gait from RW to [facility] as able.     Debbe Odea, PT,DPT 08/04/2021, 11:21 AM

## 2021-08-06 ENCOUNTER — Encounter: Payer: Self-pay | Admitting: Physical Therapy

## 2021-08-06 ENCOUNTER — Ambulatory Visit (INDEPENDENT_AMBULATORY_CARE_PROVIDER_SITE_OTHER): Payer: PRIVATE HEALTH INSURANCE | Admitting: Physical Therapy

## 2021-08-06 DIAGNOSIS — R600 Localized edema: Secondary | ICD-10-CM | POA: Diagnosis not present

## 2021-08-06 DIAGNOSIS — R262 Difficulty in walking, not elsewhere classified: Secondary | ICD-10-CM | POA: Diagnosis not present

## 2021-08-06 DIAGNOSIS — M25561 Pain in right knee: Secondary | ICD-10-CM | POA: Diagnosis not present

## 2021-08-06 DIAGNOSIS — M6281 Muscle weakness (generalized): Secondary | ICD-10-CM | POA: Diagnosis not present

## 2021-08-06 NOTE — Therapy (Signed)
OUTPATIENT PHYSICAL THERAPY TREATMENT NOTE   Patient Name: Jessica Huff MRN: [id number] DOB:03-08-1960, 61 y.o., female Today's Date: 6/14/2023a  END OF SESSION:   PT End of Session - 08/06/21 1045     Visit Number 5    Number of Visits 15    Date for PT Re-Evaluation 09/18/21    PT Start Time 1050    PT Stop Time [id number]    PT Time Calculation (min) 48 min    Activity Tolerance Patient tolerated treatment well    Behavior During Therapy WFL for tasks assessed/performed             Past Medical History:  Diagnosis Date   Asthma    GERD (gastroesophageal reflux disease)    Hyperlipidemia    Hypertension    Past Surgical History:  Procedure Laterality Date   ANKLE SURGERY Right 2018   LUMBAR FUSION  2020   L4-L5   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   Procedure: RIGHT TOTAL KNEE ARTHROPLASTY;  Surgeon: Newt Minion, MD;  Location: [facility];  Service: Orthopedics;  Laterality: Right;   Patient Active Problem List   Diagnosis Date Noted   Total knee replacement status, right 06/25/2021   Tear of lateral meniscus of right knee 03/19/2021   Unilateral primary osteoarthritis, right knee 03/19/2021   Acute pain of right knee 02/07/2021   Malaise and fatigue 02/07/2021   Prediabetes 10/19/2018   S/P lumbar fusion 09/16/2018   Asthma in adult, moderate persistent, uncomplicated [id number]   Chronic pain of right ankle 03/02/2016   Allergic rhinitis 11/29/2015   Class 1 obesity due to excess calories with serious comorbidity and body mass index (BMI) of 32.0 to 32.9 in adult 11/29/2015   Benign positional vertigo 04/07/2015   Chronic bilateral low back pain with sciatica 04/07/2015   Common migraine 04/07/2015   Hyperlipidemia 04/07/2015   Essential hypertension 04/07/2015   Insomnia 04/07/2015   Obstructive sleep apnea 04/07/2015   Lumbar radiculopathy 04/07/2015   Anxiety 04/07/2015     THERAPY DIAG:  Acute pain of right knee  Difficulty in walking, not  elsewhere classified  Muscle weakness (generalized)  Localized edema    PCP: Haydee Salter, MD   REFERRING PROVIDER: Suzan Slick, NP   REFERRING DIAG: [phone] (ICD-10-CM) - Status post total right knee replacement   THERAPY DIAG:  Acute pain of right knee   Difficulty in walking, not elsewhere classified   Muscle weakness (generalized)   Localized edema   Rationale for Evaluation and Treatment Rehabilitation   ONSET DATE: Rt TKA 06/25/21   SUBJECTIVE:    SUBJECTIVE STATEMENT: She states not really having pain upon arrival PERTINENT HISTORY:Rt TKA, lumbar fusion,asthma, chronic pain,vertigo, sciatica,insomnia,sleep apnea     PAIN:  Are you having pain? Yes: 0/10 upon arrival Pain location: medial and posterior Rt knee Pain description: sharp and throbbing Aggravating factors: straightening her knee, steps Relieving factors:     PRECAUTIONS: None   WEIGHT BEARING RESTRICTIONS No   FALLS:  Has patient fallen in last 6 months? No   LIVING ENVIRONMENT: Around 7 steps inside to bedroom   OCCUPATION: works for [name] involving standing, walking, bending, lifting   PLOF: Independent   PATIENT GOALS : walk better     OBJECTIVE:    DIAGNOSTIC FINDINGS:    PATIENT SURVEYS:  07/24/21 FOTO 23% functional intake   COGNITION:           Overall cognitive status: Within functional limits  for tasks assessed                          SENSATION:     EDEMA:  07/24/21 Moderate edema in Rt knee, no other signs of infection noted   MUSCLE LENGTH: Tightness noted in quads and hamsrings on Rt   POSTURE:    PALPATION:     LOWER EXTREMITY ROM:    AROM/PROM Right eval Right  6/01/25/22  Hip flexion      Hip extension      Hip abduction      Hip adduction      Hip internal rotation      Hip external rotation      Knee flexion 110/115  115/120  Knee extension 8/5  6/4  Ankle dorsiflexion      Ankle plantarflexion      Ankle  inversion      Ankle eversion       (Blank rows = not tested)   LOWER EXTREMITY MMT:   MMT in sitting Right eval Left eval  Hip flexion 3+    Hip extension      Hip abduction 4    Hip adduction      Hip internal rotation      Hip external rotation      Knee flexion 4 5  Knee extension 3+ 5  Ankle dorsiflexion      Ankle plantarflexion      Ankle inversion      Ankle eversion       (Blank rows = not tested)   LOWER EXTREMITY SPECIAL TESTS:      FUNCTIONAL TESTS:      GAIT: 07/24/21 Distance walked: ambulates in with RW that was too short and was properly adjusted for her. She was then able to progress to [facility] 25 feet X 2 with cues and demo for technique. Close supervision needed. 07/28/21 Arrives with new front wheel RW that was also too short for her so this was adjusted to proper height and tennis balls added. Then Gait with SPC 150 feet with supervision and cues/demo for technique 07/30/21 Gait with SPC 150 feet with supervision and cues/demo for step through pattern vs her preferred step to pattern 08/06/21 Gait with SPC 150 feet with supervision and did not need cues for technique and step through pattern         TODAY'S TREATMENT: 08/06/21 Nu step L6 X 8 min Gait with SPC, see above for details In bars for march walking with bilat UE support 3 round trips In bars for sidestepping then fwd/retro walking 3 round trips without UE support Rt hamstring stretch seated 30 sec X 3 Slantboard stretch 30 sec X3 Heel and toe raises 2X15 bilat Leg press DL 87# 3X10, then Rt leg only 31# 2X10 Seated knee flexion stretch AAROM 10 sec X 10 Seated LAQ 3# 2X15 on Rt Rt heel prop for knee extension stretch 3 min with quad sets  Manual therapy: Rt knee PROM, extension mobs grade 2-3, passive hamstring stretching  Modalities: Vasopnuematic to Rt knee X 10 min, medium compression 34 deg  08/04/21 Sci fit bike X 6 min L5 Rt hamstring stretch seated 1 minute X 3 Slantboard stretch  30 sec X3 Heel and toe raises 2X15 bilat Seated knee flexion stretch AAROM 10 sec X 10 Seated LAQ 3# 2X15 on Rt Seated SLR 3X10 on Rt Rt heel prop for knee extension stretch 3 min  Manual  therapy: Rt knee PROM, extension mobs grade 2-3, passive hamstring stretching  Modalities: Vasopnuematic to Rt knee X 10 min, medium compression 34 deg     PATIENT EDUCATION:  Education details: HEP, PT plan of care Person educated: Patient Education method: Explanation, Demonstration, Verbal cues, and Handouts Education comprehension: verbalized understanding, returned demonstration, and needs further education     HOME EXERCISE PROGRAM: Access Code: [email or web address] URL: [email or web address] Date: 07/24/2021 Prepared by: Elsie Ra   Exercises - Quad Setting and Stretching  - 2 x daily - 6 x weekly - 2 sets - 10 reps - Seated Knee Flexion Stretch  - 2 x daily - 6 x weekly - 1-2 sets - 10 reps - 5 sec hold - Seated Hamstring Stretch  - 2 x daily - 6 x weekly - 1 sets - 3 reps - 30 hold - Seated Straight Leg Heel Taps  - 2 x daily - 6 x weekly - 3 sets - 5 reps - Mini Squat with Counter Support  - 2 x daily - 6 x weekly - 1-2 sets - 10 reps     ASSESSMENT:   CLINICAL IMPRESSION:We continued to work to improve her knee extension ROM and overall knee strength. Knee flexion ROM is doing well. We are working to progress her gait and balance to wean her off of RW as able.    OBJECTIVE IMPAIRMENTS: decreased activity tolerance, difficulty walking, decreased balance, decreased endurance, decreased mobility, decreased ROM, decreased strength, impaired flexibility, impaired LE use, postural dysfunction, and pain.   ACTIVITY LIMITATIONS: bending, lifting, carry, locomotion, cleaning, community activity, driving, and or occupation   PERSONAL FACTORS:  Rt TKA, lumbar fusion,asthma, chronic pain,vertigo, sciatica,insomnia,sleep apnea are also affecting patient's functional outcome.   REHAB  POTENTIAL: Good   CLINICAL DECISION MAKING: Stable/uncomplicated   EVALUATION COMPLEXITY: Low       GOALS: Short term PT Goals Target date: 08/21/2021 Pt will be I and compliant with HEP. Baseline:  Goal status: ongoing Pt will improve gait from RW to [facility] no more than supervision for 300 feet Baseline: Goal status:ongoing, can go 150 feet as of 07/30/21   Long term PT goals Target date: 09/18/2021 Pt will improve Rt knee PROM 0-120 to improve functional mobility Baseline: Goal status: ongoing, 4-120 on 08/04/21 Pt will improve  Rt hip/knee strength to at least 5-/5 MMT to improve functional strength Baseline: Goal status: ongoing Pt will improve FOTO to at least 49% functional to show improved function Baseline: Goal status: ongoing Pt will reduce pain to overall less than 2-3/10 with usual activity and work activity. Baseline: Goal status: ongoing Pt will be able to ambulate community distances independently at least 750 ft WNL gait pattern without complaints Baseline: Goal status: ongoing   PLAN: PT FREQUENCY: 1-3 times per week    PT DURATION: 6-8 weeks   PLANNED INTERVENTIONS (unless contraindicated): aquatic PT, Canalith repositioning, cryotherapy, Electrical stimulation, Iontophoresis with 4 mg/ml dexamethasome, Moist heat, traction, Ultrasound, gait training, Therapeutic exercise, balance training, neuromuscular re-education, patient/family education, prosthetic training, manual techniques, passive ROM, dry needling, taping, vasopnuematic device, vestibular, spinal manipulations, joint manipulations   PLAN FOR NEXT SESSION: knee extension ROM emphasis and quad strength to tolerance, progress gait from RW to [facility] as able.     Debbe Odea, PT,DPT 08/06/2021, 11:17 AM

## 2021-08-11 ENCOUNTER — Encounter: Payer: PRIVATE HEALTH INSURANCE | Admitting: Physical Therapy

## 2021-08-13 ENCOUNTER — Other Ambulatory Visit: Payer: Self-pay | Admitting: Family Medicine

## 2021-08-13 ENCOUNTER — Ambulatory Visit (INDEPENDENT_AMBULATORY_CARE_PROVIDER_SITE_OTHER): Payer: PRIVATE HEALTH INSURANCE | Admitting: Physical Therapy

## 2021-08-13 ENCOUNTER — Encounter: Payer: Self-pay | Admitting: Physical Therapy

## 2021-08-13 DIAGNOSIS — R600 Localized edema: Secondary | ICD-10-CM | POA: Diagnosis not present

## 2021-08-13 DIAGNOSIS — M6281 Muscle weakness (generalized): Secondary | ICD-10-CM | POA: Diagnosis not present

## 2021-08-13 DIAGNOSIS — M25561 Pain in right knee: Secondary | ICD-10-CM | POA: Diagnosis not present

## 2021-08-13 DIAGNOSIS — R262 Difficulty in walking, not elsewhere classified: Secondary | ICD-10-CM | POA: Diagnosis not present

## 2021-08-13 DIAGNOSIS — G8929 Other chronic pain: Secondary | ICD-10-CM

## 2021-08-13 NOTE — Therapy (Signed)
OUTPATIENT PHYSICAL THERAPY TREATMENT NOTE   Patient Name: Jessica Huff MRN: [id number] DOB:[birth year], 61 y.o., female Today's Date: 6/21/2023a  END OF SESSION:   PT End of Session - 08/13/21 1052     Visit Number 6    Number of Visits 15    Date for PT Re-Evaluation 09/18/21    PT Start Time 7858    PT Stop Time 1132    PT Time Calculation (min) 48 min    Activity Tolerance Patient tolerated treatment well    Behavior During Therapy [facility] for tasks assessed/performed             Past Medical History:  Diagnosis Date   Asthma    GERD (gastroesophageal reflux disease)    Hyperlipidemia    Hypertension    Past Surgical History:  Procedure Laterality Date   ANKLE SURGERY Right 2018   LUMBAR FUSION  2020   L4-L5   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   Procedure: RIGHT TOTAL KNEE ARTHROPLASTY;  Surgeon: Newt Minion, MD;  Location: [place];  Service: Orthopedics;  Laterality: Right;   Patient Active Problem List   Diagnosis Date Noted   Total knee replacement status, right 06/25/2021   Tear of lateral meniscus of right knee 03/19/2021   Unilateral primary osteoarthritis, right knee 03/19/2021   Acute pain of right knee 02/07/2021   Malaise and fatigue 02/07/2021   Prediabetes 10/19/2018   S/P lumbar fusion 09/16/2018   Asthma in adult, moderate persistent, uncomplicated 85/03/7739   Chronic pain of right ankle 03/02/2016   Allergic rhinitis 11/29/2015   Class 1 obesity due to excess calories with serious comorbidity and body mass index (BMI) of 32.0 to 32.9 in adult 11/29/2015   Benign positional vertigo 04/07/2015   Chronic bilateral low back pain with sciatica 04/07/2015   Common migraine 04/07/2015   Hyperlipidemia 04/07/2015   Essential hypertension 04/07/2015   Insomnia 04/07/2015   Obstructive sleep apnea 04/07/2015   Lumbar radiculopathy 04/07/2015   Anxiety 04/07/2015     THERAPY DIAG:  Acute pain of right knee  Difficulty in walking, not  elsewhere classified  Muscle weakness (generalized)  Localized edema    PCP: Haydee Salter, MD   REFERRING PROVIDER: Suzan Slick, NP   REFERRING DIAG: [phone] (ICD-10-CM) - Status post total right knee replacement   THERAPY DIAG:  Acute pain of right knee   Difficulty in walking, not elsewhere classified   Muscle weakness (generalized)   Localized edema   Rationale for Evaluation and Treatment Rehabilitation   ONSET DATE: Rt TKA 06/25/21   SUBJECTIVE:    SUBJECTIVE STATEMENT: She states knee is aching a little more, she can tell its going to rain. She is weaning off her RW. She got permission to drive if she feels safe to do so. PERTINENT HISTORY:Rt TKA, lumbar fusion,asthma, chronic pain,vertigo, sciatica,insomnia,sleep apnea     PAIN:  Are you having pain? Yes: 3/10 upon arrival Pain location: medial and posterior Rt knee Pain description: sharp and throbbing Aggravating factors: straightening her knee, steps Relieving factors:     PRECAUTIONS: None   WEIGHT BEARING RESTRICTIONS No   FALLS:  Has patient fallen in last 6 months? 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, walking, bending, lifting   PLOF: Independent   PATIENT GOALS : walk better     OBJECTIVE:    DIAGNOSTIC FINDINGS:    PATIENT SURVEYS:  07/24/21 FOTO 23% functional intake   COGNITION:           Overall cognitive status: Within functional limits for tasks assessed                          SENSATION:     EDEMA:  07/24/21 Moderate edema in Rt knee, no other signs of infection noted   MUSCLE LENGTH: Tightness noted in quads and hamsrings on Rt   POSTURE:    PALPATION:     LOWER EXTREMITY ROM:    AROM/PROM Right eval Right  6/01/25/22 Right 08/13/21  Hip flexion       Hip extension       Hip abduction       Hip adduction       Hip internal rotation       Hip external rotation        Knee flexion 110/115  115/120 /120  Knee extension 8/5  6/4 5/3  Ankle dorsiflexion       Ankle plantarflexion       Ankle inversion       Ankle eversion        (Blank rows = not tested)   LOWER EXTREMITY MMT:   MMT in sitting Right eval Left eval   Hip flexion 3+     Hip extension       Hip abduction 4     Hip adduction       Hip internal rotation       Hip external rotation       Knee flexion 4 5   Knee extension 3+ 5   Ankle dorsiflexion       Ankle plantarflexion       Ankle inversion       Ankle eversion        (Blank rows = not tested)   LOWER EXTREMITY SPECIAL TESTS:      FUNCTIONAL TESTS:      GAIT: 07/24/21 Distance walked: ambulates in with RW that was too short and was properly adjusted for her. She was then able to progress to [facility] 25 feet X 2 with cues and demo for technique. Close supervision needed. 07/28/21 Arrives with new front wheel RW that was also too short for her so this was adjusted to proper height and tennis balls added. Then Gait with SPC 150 feet with supervision and cues/demo for technique 07/30/21 Gait with SPC 150 feet with supervision and cues/demo for step through pattern vs her preferred step to pattern 08/06/21 Gait with SPC 150 feet with supervision and did not need cues for technique and step through pattern 08/13/21 Gait no AD 100 feet with supervision         TODAY'S TREATMENT: 08/13/21 Nu step L6 X 8 min Leg press DL 87# 3X10, then Rt leg only 37# 2X10 Gait without AD, see above In bars for march walking with bilat UE support 3 round trips In bars for sidestepping 3 round trips without UE support Seated drive simulation testing reaction time and ability to shift from gas to break pushing into bosu ball X 10 Tandem balance 30 sec X2 bilat Rt hamstring stretch seated 30 sec X 3 Slantboard stretch 30 sec X3 Heel and toe raises X20 bilat Sit to stand using UE support to push up then slow eccentrics without UE support to lower  X 10 Seated LAQ 4# 2X15 on Rt Rt heel prop for knee extension stretch  3 min with quad sets  Manual therapy: Rt knee PROM, extension mobs grade 2-3, passive hamstring stretching  Modalities: Vasopnuematic to Rt knee X 10 min, medium compression 34 deg  08/06/21 Nu step L6 X 8 min Gait with SPC, see above for details In bars for march walking with bilat UE support 3 round trips In bars for sidestepping then fwd/retro walking 3 round trips without UE support Rt hamstring stretch seated 30 sec X 3 Slantboard stretch 30 sec X3 Heel and toe raises 2X15 bilat Leg press DL 87# 3X10, then Rt leg only 31# 2X10 Seated knee flexion stretch AAROM 10 sec X 10 Seated LAQ 3# 2X15 on Rt Rt heel prop for knee extension stretch 3 min with quad sets  Manual therapy: Rt knee PROM, extension mobs grade 2-3, passive hamstring stretching  Modalities: Vasopnuematic to Rt knee X 10 min, medium compression 34 deg  08/04/21 Sci fit bike X 6 min L5 Rt hamstring stretch seated 1 minute X 3 Slantboard stretch 30 sec X3 Heel and toe raises 2X15 bilat Seated knee flexion stretch AAROM 10 sec X 10 Seated LAQ 3# 2X15 on Rt Seated SLR 3X10 on Rt Rt heel prop for knee extension stretch 3 min  Manual therapy: Rt knee PROM, extension mobs grade 2-3, passive hamstring stretching  Modalities: Vasopnuematic to Rt knee X 10 min, medium compression 34 deg     PATIENT EDUCATION:  Education details: HEP, PT plan of care Person educated: Patient Education method: Explanation, Demonstration, Verbal cues, and Handouts Education comprehension: verbalized understanding, returned demonstration, and needs further education     HOME EXERCISE PROGRAM: Access Code: [email or web address] URL: [email or web address] Date: 07/24/2021 Prepared by: Elsie Ra   Exercises - Quad Setting and Stretching  - 2 x daily - 6 x weekly - 2 sets - 10 reps - Seated Knee Flexion Stretch  - 2 x daily - 6 x weekly - 1-2 sets - 10 reps -  5 sec hold - Seated Hamstring Stretch  - 2 x daily - 6 x weekly - 1 sets - 3 reps - 30 hold - Seated Straight Leg Heel Taps  - 2 x daily - 6 x weekly - 3 sets - 5 reps - Mini Squat with Counter Support  - 2 x daily - 6 x weekly - 1-2 sets - 10 reps     ASSESSMENT:   CLINICAL IMPRESSION:She is progressing with ambulation and weaning off of RW.  She has met ROM goal for flexion but still lacking some extension ROM we will focus on in PT.  OBJECTIVE IMPAIRMENTS: decreased activity tolerance, difficulty walking, decreased balance, decreased endurance, decreased mobility, decreased ROM, decreased strength, impaired flexibility, impaired LE use, postural dysfunction, and pain.   ACTIVITY LIMITATIONS: bending, lifting, carry, locomotion, cleaning, community activity, driving, and or occupation   PERSONAL FACTORS:  Rt TKA, lumbar fusion,asthma, chronic pain,vertigo, sciatica,insomnia,sleep apnea are also affecting patient's functional outcome.   REHAB POTENTIAL: Good   CLINICAL DECISION MAKING: Stable/uncomplicated   EVALUATION COMPLEXITY: Low       GOALS: Short term PT Goals Target date: 08/21/2021 Pt will be I and compliant with HEP. Baseline:  Goal status: ongoing Pt will improve gait from RW to [facility] no more than supervision for 300 feet Baseline: Goal status:ongoing, can go 150 feet as of 07/30/21   Long term PT goals Target date: 09/18/2021 Pt will improve Rt knee PROM 0-120 to improve functional mobility Baseline: Goal status: ongoing, 4-120 on 08/04/21 Pt will improve  Rt hip/knee strength to at least 5-/5 MMT to improve functional strength Baseline: Goal status: ongoing Pt will improve FOTO to at least 49% functional to show improved function Baseline: Goal status: ongoing Pt will reduce pain to overall less than 2-3/10 with usual activity and work activity. Baseline: Goal status: ongoing Pt will be able to ambulate community distances independently at least 750 ft WNL gait  pattern without complaints Baseline: Goal status: ongoing   PLAN: PT FREQUENCY: 1-3 times per week    PT DURATION: 6-8 weeks   PLANNED INTERVENTIONS (unless contraindicated): aquatic PT, Canalith repositioning, cryotherapy, Electrical stimulation, Iontophoresis with 4 mg/ml dexamethasome, Moist heat, traction, Ultrasound, gait training, Therapeutic exercise, balance training, neuromuscular re-education, patient/family education, prosthetic training, manual techniques, passive ROM, dry needling, taping, vasopnuematic device, vestibular, spinal manipulations, joint manipulations   PLAN FOR NEXT SESSION: knee extension ROM emphasis and quad strength to tolerance, progress gait to no AD as able.    Debbe Odea, PT,DPT 08/13/2021, 10:53 AM

## 2021-08-22 ENCOUNTER — Encounter: Payer: Self-pay | Admitting: Physical Medicine and Rehabilitation

## 2021-08-22 ENCOUNTER — Encounter
Payer: PRIVATE HEALTH INSURANCE | Attending: Physical Medicine and Rehabilitation | Admitting: Physical Medicine and Rehabilitation

## 2021-08-22 ENCOUNTER — Ambulatory Visit: Payer: PRIVATE HEALTH INSURANCE | Admitting: Family

## 2021-08-22 ENCOUNTER — Other Ambulatory Visit: Payer: Self-pay | Admitting: Physical Medicine and Rehabilitation

## 2021-08-22 VITALS — BP 116/74 | HR 105 | Ht 72.0 in | Wt 238.0 lb

## 2021-08-22 DIAGNOSIS — M5441 Lumbago with sciatica, right side: Secondary | ICD-10-CM | POA: Diagnosis not present

## 2021-08-22 DIAGNOSIS — J4540 Moderate persistent asthma, uncomplicated: Secondary | ICD-10-CM | POA: Insufficient documentation

## 2021-08-22 DIAGNOSIS — Z96651 Presence of right artificial knee joint: Secondary | ICD-10-CM

## 2021-08-22 DIAGNOSIS — G8929 Other chronic pain: Secondary | ICD-10-CM | POA: Insufficient documentation

## 2021-08-22 MED ORDER — MONTELUKAST SODIUM 10 MG PO TABS: 10.0000 mg | ORAL_TABLET | Freq: Every day | ORAL | 1 refills | Status: DC
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
## 2021-08-22 NOTE — Progress Notes (Deleted)
   Subjective:    Patient ID: Jessica Huff, female    DOB: 11/16/1960, 60 y.o.   MRN: [id number]  HPI  .cpr Review of Systems     Objective:   Physical Exam        Assessment & Plan:

## 2021-08-22 NOTE — Patient Instructions (Addendum)
Pt is a 61 yr old female with hx of asthma, prediabetes; HTN, BMI of 32; and chronic low back pain with sciatica as well as hx of lumbar fusion- L4/5 had surgery for back pain. Here for evaluation of back pain s/p L4/5 back fusion and new RLE weakness. Also has moderate uncontrolled asthma.  Here for f/u on back pain/RLE weakness.   Advised to take Lyrica 100 mg 2x/day for nerve pain-  Can take while taking pain meds for R knee replacement.    2. Doesn't need refills on Lyrica. Has enough.   3. Asking about getting meds for asthma- doesn't have nebulizer, but will need to call PCP to let them know. Might benefit from steroid inhaler at higher dose vs nebulizer?- On Flovent 110 mcg- 2x/day right now.    4. Suggest increasing inhaler to 2 -4 puffs every 2-4 hours as needed for shortness of breath- air quality is poor- up to 120's- also wearing mask when outside could help. Make sure not squirting on tongue- squirt into throat so can use the medicine for breathing.   5. Will try Singulair 10 mg nightly for asthma- will give 1 month's supply with 1 refill- if works, then can ask PCP to continue it for you.   6. It will make you jittery to use inhaler more than 2x- up to 4x- 4x is equal to nebulizer.   7. F/U in 22months- to see how doing.  8. Pursed lip breathing -demonstrated how to do- do if cannot get to inhaler immediately.

## 2021-08-25 ENCOUNTER — Ambulatory Visit (INDEPENDENT_AMBULATORY_CARE_PROVIDER_SITE_OTHER): Payer: PRIVATE HEALTH INSURANCE | Admitting: Physical Therapy

## 2021-08-25 ENCOUNTER — Encounter: Payer: Self-pay | Admitting: Physical Therapy

## 2021-08-25 DIAGNOSIS — R262 Difficulty in walking, not elsewhere classified: Secondary | ICD-10-CM | POA: Diagnosis not present

## 2021-08-25 DIAGNOSIS — M25561 Pain in right knee: Secondary | ICD-10-CM | POA: Diagnosis not present

## 2021-08-25 DIAGNOSIS — M6281 Muscle weakness (generalized): Secondary | ICD-10-CM

## 2021-08-25 DIAGNOSIS — R600 Localized edema: Secondary | ICD-10-CM | POA: Diagnosis not present

## 2021-08-25 NOTE — Progress Notes (Signed)
   Post-Op Visit Note   Patient: Jessica Huff           Date of Birth: Sep 17, 1960           MRN: [id number] Visit Date: 08/22/2021 PCP: Loyola Mast, MD  Chief Complaint:  Chief Complaint  Patient presents with   Right Knee - Routine Post Op    06/25/21 right TKA    HPI:  HPI The patient is a 61 year old woman who presents status post total knee arthroplasty she continues with physical therapy feels that things are going well.  She states that she is not quite where she would like to be she continues to have pain daily but is making steady progress.  She is still using a walker when she leaves the home but is eager to get over to a cane. Ortho Exam On examination of the right knee there is trace edema the incision is well-healed there is no erythema no warmth she does have full passive extension she has active flexion to 110  Visit Diagnoses: No diagnosis found.  Plan: Pleased with her progress.  She will continue with her physical therapy continue increasing her activities as tolerated follow-up in the office in 4 weeks as needed  Follow-Up Instructions: No follow-ups on file.   Imaging: No results found.  Orders:  No orders of the defined types were placed in this encounter.  No orders of the defined types were placed in this encounter.    PMFS History: Patient Active Problem List   Diagnosis Date Noted   Total knee replacement status, right 06/25/2021   Tear of lateral meniscus of right knee 03/19/2021   Unilateral primary osteoarthritis, right knee 03/19/2021   Acute pain of right knee 02/07/2021   Malaise and fatigue 02/07/2021   Prediabetes 10/19/2018   S/P lumbar fusion 09/16/2018   Asthma in adult, moderate persistent, uncomplicated 07/03/2016   Chronic pain of right ankle 03/02/2016   Allergic rhinitis 11/29/2015   Class 1 obesity due to excess calories with serious comorbidity and body mass index (BMI) of 32.0 to 32.9 in adult 11/29/2015   Benign  positional vertigo 04/07/2015   Chronic bilateral low back pain with sciatica 04/07/2015   Common migraine 04/07/2015   Hyperlipidemia 04/07/2015   Essential hypertension 04/07/2015   Insomnia 04/07/2015   Obstructive sleep apnea 04/07/2015   Lumbar radiculopathy 04/07/2015   Anxiety 04/07/2015   Past Medical History:  Diagnosis Date   Asthma    GERD (gastroesophageal reflux disease)    Hyperlipidemia    Hypertension     Family History  Problem Relation Age of Onset   Diabetes Mother    Kidney disease Mother    Cancer Father        Kidney    Past Surgical History:  Procedure Laterality Date   ANKLE SURGERY Right 2018   LUMBAR FUSION  2020   L4-L5   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   Procedure: RIGHT TOTAL KNEE ARTHROPLASTY;  Surgeon: Nadara Mustard, MD;  Location: [facility] OR;  Service: Orthopedics;  Laterality: Right;   Social History   Occupational History   Not on file  Tobacco Use   Smoking status: Never   Smokeless tobacco: Never  Vaping Use   Vaping Use: Never used  Substance and Sexual Activity   Alcohol use: Yes    Comment: socially   Drug use: Never   Sexual activity: Yes

## 2021-08-25 NOTE — Therapy (Signed)
OUTPATIENT PHYSICAL THERAPY TREATMENT NOTE   Patient Name: Jessica Huff MRN: [id number] DOB:[birth year], 61 y.o., female Today's Date: 7/3/2023a  END OF SESSION:   PT End of Session - 08/25/21 1114     Visit Number 7    Number of Visits 15    Date for PT Re-Evaluation 09/18/21    PT Start Time 1100    PT Stop Time 1148    PT Time Calculation (min) 48 min    Activity Tolerance Patient tolerated treatment well    Behavior During Therapy [facility] for tasks assessed/performed             Past Medical History:  Diagnosis Date   Asthma    GERD (gastroesophageal reflux disease)    Hyperlipidemia    Hypertension    Past Surgical History:  Procedure Laterality Date   ANKLE SURGERY Right 2018   LUMBAR FUSION  2020   L4-L5   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   Procedure: RIGHT TOTAL KNEE ARTHROPLASTY;  Surgeon: Nadara Mustard, MD;  Location: [facility] OR;  Service: Orthopedics;  Laterality: Right;   Patient Active Problem List   Diagnosis Date Noted   Total knee replacement status, right 06/25/2021   Tear of lateral meniscus of right knee 03/19/2021   Unilateral primary osteoarthritis, right knee 03/19/2021   Acute pain of right knee 02/07/2021   Malaise and fatigue 02/07/2021   Prediabetes 10/19/2018   S/P lumbar fusion 09/16/2018   Asthma in adult, moderate persistent, uncomplicated 07/03/2016   Chronic pain of right ankle 03/02/2016   Allergic rhinitis 11/29/2015   Class 1 obesity due to excess calories with serious comorbidity and body mass index (BMI) of 32.0 to 32.9 in adult 11/29/2015   Benign positional vertigo 04/07/2015   Chronic bilateral low back pain with sciatica 04/07/2015   Common migraine 04/07/2015   Hyperlipidemia 04/07/2015   Essential hypertension 04/07/2015   Insomnia 04/07/2015   Obstructive sleep apnea 04/07/2015   Lumbar radiculopathy 04/07/2015   Anxiety 04/07/2015     THERAPY DIAG:  No diagnosis found.    PCP: Loyola Mast, MD    REFERRING PROVIDER: Adonis Huguenin, NP   REFERRING DIAG: [phone] (ICD-10-CM) - Status post total right knee replacement   THERAPY DIAG:  Acute pain of right knee   Difficulty in walking, not elsewhere classified   Muscle weakness (generalized)   Localized edema   Rationale for Evaluation and Treatment Rehabilitation   ONSET DATE: Rt TKA 06/25/21   SUBJECTIVE:    SUBJECTIVE STATEMENT: She relays knee feels ok overall, still feels like she needs RW at times  PERTINENT HISTORY:Rt TKA, lumbar fusion,asthma, chronic pain,vertigo, sciatica,insomnia,sleep apnea     PAIN:  Are you having pain? Yes: 3/10 upon arrival Pain location: medial and posterior Rt knee Pain description: sharp and throbbing Aggravating factors: straightening her knee, steps Relieving factors:  ice   PRECAUTIONS: None   WEIGHT BEARING RESTRICTIONS No   FALLS:  Has patient fallen in last 6 months? No   LIVING ENVIRONMENT: Around 7 steps inside to bedroom   OCCUPATION: works for [employer] involving standing, walking, bending, lifting   PLOF: Independent   PATIENT GOALS : walk better     OBJECTIVE:    DIAGNOSTIC FINDINGS:    PATIENT SURVEYS:  07/24/21 FOTO 23% functional intake   COGNITION:           Overall cognitive status: Within functional limits for tasks assessed  SENSATION:     EDEMA:  07/24/21 Moderate edema in Rt knee, no other signs of infection noted   MUSCLE LENGTH: Tightness noted in quads and hamsrings on Rt   POSTURE:    PALPATION:     LOWER EXTREMITY ROM:    AROM/PROM Right eval Right  6/01/25/22 Right 08/13/21  Hip flexion       Hip extension       Hip abduction       Hip adduction       Hip internal rotation       Hip external rotation       Knee flexion 110/115  115/120 /120  Knee extension 8/5  6/4 5/3  Ankle dorsiflexion       Ankle plantarflexion       Ankle inversion       Ankle eversion        (Blank rows  = not tested)   LOWER EXTREMITY MMT:   MMT in sitting Right eval Left eval Right 08/25/21  Hip flexion 3+     Hip extension       Hip abduction 4     Hip adduction       Hip internal rotation       Hip external rotation       Knee flexion 4 5 4   Knee extension 3+ 5 4  Ankle dorsiflexion       Ankle plantarflexion       Ankle inversion       Ankle eversion        (Blank rows = not tested)   LOWER EXTREMITY SPECIAL TESTS:      FUNCTIONAL TESTS:      GAIT: 07/24/21 Distance walked: ambulates in with RW that was too short and was properly adjusted for her. She was then able to progress to [facility] 25 feet X 2 with cues and demo for technique. Close supervision needed. 07/28/21 Arrives with new front wheel RW that was also too short for her so this was adjusted to proper height and tennis balls added. Then Gait with SPC 150 feet with supervision and cues/demo for technique 07/30/21 Gait with SPC 150 feet with supervision and cues/demo for step through pattern vs her preferred step to pattern 08/06/21 Gait with SPC 150 feet with supervision and did not need cues for technique and step through pattern 08/13/21 Gait no AD 100 feet with supervision 08/25/21 Gait no AD 100 feet X2 with supervision         TODAY'S TREATMENT: 08/25/21 Nu step L6 X 10 min Gait without AD, see above In bars for march walking down and then back backwards with bilat UE support 5 round trips In bars for sidestepping 5 round trips without UE support Step ups in bars with bilat UE support 6 inch step, up with left and down in front with right X10, also lateral X 10 bilat Tandem balance 30 sec X2 bilat Rt hamstring stretch seated 30 sec X 3 Slantboard stretch 30 sec X3 Sit to stand using UE support to push up then slow eccentrics without UE support to lower X 10 Seated LAQ 5# 3X10 on Rt Rt heel prop for knee extension stretch 3 min with quad sets  Modalities: Vasopnuematic to Rt knee X 10 min, medium compression  34 deg  08/13/21 Nu step L6 X 8 min Leg press DL 87# 3X10, then Rt leg only 37# 2X10 Gait without AD, see above In bars for march walking with  bilat UE support 3 round trips In bars for sidestepping 3 round trips without UE support Seated drive simulation testing reaction time and ability to shift from gas to break pushing into bosu ball X 10 Tandem balance 30 sec X2 bilat Rt hamstring stretch seated 30 sec X 3 Slantboard stretch 30 sec X3 Heel and toe raises X20 bilat Sit to stand using UE support to push up then slow eccentrics without UE support to lower X 10 Seated LAQ 4# 2X15 on Rt Rt heel prop for knee extension stretch 3 min with quad sets  Manual therapy: Rt knee PROM, extension mobs grade 2-3, passive hamstring stretching  Modalities: Vasopnuematic to Rt knee X 10 min, medium compression 34 deg    PATIENT EDUCATION:  Education details: HEP, PT plan of care Person educated: Patient Education method: Explanation, Demonstration, Verbal cues, and Handouts Education comprehension: verbalized understanding, returned demonstration, and needs further education     HOME EXERCISE PROGRAM: Access Code: [email or web address] URL: [email or web address] Date: 07/24/2021 Prepared by: Ivery Quale   Exercises - Quad Setting and Stretching  - 2 x daily - 6 x weekly - 2 sets - 10 reps - Seated Knee Flexion Stretch  - 2 x daily - 6 x weekly - 1-2 sets - 10 reps - 5 sec hold - Seated Hamstring Stretch  - 2 x daily - 6 x weekly - 1 sets - 3 reps - 30 hold - Seated Straight Leg Heel Taps  - 2 x daily - 6 x weekly - 3 sets - 5 reps - Mini Squat with Counter Support  - 2 x daily - 6 x weekly - 1-2 sets - 10 reps     ASSESSMENT:   CLINICAL IMPRESSION:She still shows up with her RW but I feel she does not need this with PT so I have her walk without it. She does say she most of the time does not use it and is trying to wean off of it. Overall still lacks some functional strength, dynamic  balance and slight knee extension ROM thus she will continue to benefit from skilled PT.  OBJECTIVE IMPAIRMENTS: decreased activity tolerance, difficulty walking, decreased balance, decreased endurance, decreased mobility, decreased ROM, decreased strength, impaired flexibility, impaired LE use, postural dysfunction, and pain.   ACTIVITY LIMITATIONS: bending, lifting, carry, locomotion, cleaning, community activity, driving, and or occupation   PERSONAL FACTORS:  Rt TKA, lumbar fusion,asthma, chronic pain,vertigo, sciatica,insomnia,sleep apnea are also affecting patient's functional outcome.   REHAB POTENTIAL: Good   CLINICAL DECISION MAKING: Stable/uncomplicated   EVALUATION COMPLEXITY: Low       GOALS: Short term PT Goals Target date: 08/21/2021 Pt will be I and compliant with HEP. Baseline:  Goal status: ongoing Pt will improve gait from RW to [facility] no more than supervision for 300 feet Baseline: Goal status:ongoing, can go 150 feet as of 07/30/21   Long term PT goals Target date: 09/18/2021 Pt will improve Rt knee PROM 0-120 to improve functional mobility Baseline: Goal status: ongoing, 4-120 on 08/04/21 Pt will improve  Rt hip/knee strength to at least 5-/5 MMT to improve functional strength Baseline: Goal status: ongoing Pt will improve FOTO to at least 49% functional to show improved function Baseline: Goal status: ongoing Pt will reduce pain to overall less than 2-3/10 with usual activity and work activity. Baseline: Goal status: ongoing Pt will be able to ambulate community distances independently at least 750 ft WNL gait pattern without complaints Baseline: Goal status: ongoing   PLAN: PT  FREQUENCY: 1-3 times per week    PT DURATION: 6-8 weeks   PLANNED INTERVENTIONS (unless contraindicated): aquatic PT, Canalith repositioning, cryotherapy, Electrical stimulation, Iontophoresis with 4 mg/ml dexamethasome, Moist heat, traction, Ultrasound, gait training, Therapeutic  exercise, balance training, neuromuscular re-education, patient/family education, prosthetic training, manual techniques, passive ROM, dry needling, taping, vasopnuematic device, vestibular, spinal manipulations, joint manipulations   PLAN FOR NEXT SESSION: knee extension ROM emphasis and quad strength to tolerance, progress gait to no AD as able.    Debbe Odea, PT,DPT 08/25/2021, 11:15 AM

## 2021-08-27 ENCOUNTER — Ambulatory Visit (INDEPENDENT_AMBULATORY_CARE_PROVIDER_SITE_OTHER): Payer: PRIVATE HEALTH INSURANCE | Admitting: Physical Therapy

## 2021-08-27 ENCOUNTER — Encounter: Payer: Self-pay | Admitting: Physical Therapy

## 2021-08-27 DIAGNOSIS — R262 Difficulty in walking, not elsewhere classified: Secondary | ICD-10-CM

## 2021-08-27 DIAGNOSIS — M25561 Pain in right knee: Secondary | ICD-10-CM

## 2021-08-27 DIAGNOSIS — M6281 Muscle weakness (generalized): Secondary | ICD-10-CM | POA: Diagnosis not present

## 2021-08-27 DIAGNOSIS — R600 Localized edema: Secondary | ICD-10-CM

## 2021-08-27 NOTE — Therapy (Signed)
OUTPATIENT PHYSICAL THERAPY TREATMENT NOTE   Patient Name: Jessica Huff MRN: [id number] DOB:[birth year], 61 y.o., female Today's Date: 7/5/2023a  END OF SESSION:   PT End of Session - 08/27/21 1139     Visit Number 8    Number of Visits 15    Date for PT Re-Evaluation 09/18/21    PT Start Time 1100    PT Stop Time 1148    PT Time Calculation (min) 48 min    Activity Tolerance Patient tolerated treatment well    Behavior During Therapy [facility] for tasks assessed/performed              Past Medical History:  Diagnosis Date   Asthma    GERD (gastroesophageal reflux disease)    Hyperlipidemia    Hypertension    Past Surgical History:  Procedure Laterality Date   ANKLE SURGERY Right 2018   LUMBAR FUSION  2020   L4-L5   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   Procedure: RIGHT TOTAL KNEE ARTHROPLASTY;  Surgeon: Nadara Mustard, MD;  Location: [facility] OR;  Service: Orthopedics;  Laterality: Right;   Patient Active Problem List   Diagnosis Date Noted   Total knee replacement status, right 06/25/2021   Tear of lateral meniscus of right knee 03/19/2021   Unilateral primary osteoarthritis, right knee 03/19/2021   Acute pain of right knee 02/07/2021   Malaise and fatigue 02/07/2021   Prediabetes 10/19/2018   S/P lumbar fusion 09/16/2018   Asthma in adult, moderate persistent, uncomplicated 07/03/2016   Chronic pain of right ankle 03/02/2016   Allergic rhinitis 11/29/2015   Class 1 obesity due to excess calories with serious comorbidity and body mass index (BMI) of 32.0 to 32.9 in adult 11/29/2015   Benign positional vertigo 04/07/2015   Chronic bilateral low back pain with sciatica 04/07/2015   Common migraine 04/07/2015   Hyperlipidemia 04/07/2015   Essential hypertension 04/07/2015   Insomnia 04/07/2015   Obstructive sleep apnea 04/07/2015   Lumbar radiculopathy 04/07/2015   Anxiety 04/07/2015     THERAPY DIAG:  Acute pain of right knee  Difficulty in walking, not  elsewhere classified  Muscle weakness (generalized)  Localized edema    PCP: Loyola Mast, MD   REFERRING PROVIDER: Adonis Huguenin, NP   REFERRING DIAG: [phone] (ICD-10-CM) - Status post total right knee replacement    Rationale for Evaluation and Treatment Rehabilitation   ONSET DATE: Rt TKA 06/25/21   SUBJECTIVE:    SUBJECTIVE STATEMENT: She relays knee pain is not too bad today  PERTINENT HISTORY:Rt TKA, lumbar fusion,asthma, chronic pain,vertigo, sciatica,insomnia,sleep apnea     PAIN:  Are you having pain? Yes: 2.5/10 upon arrival Pain location: medial and posterior Rt knee Pain description: sharp and throbbing Aggravating factors: straightening her knee, steps Relieving factors:  ice   PRECAUTIONS: None   WEIGHT BEARING RESTRICTIONS No   FALLS:  Has patient fallen in last 6 months? No   LIVING ENVIRONMENT: Around 7 steps inside to bedroom   OCCUPATION: works for [employer] involving standing, walking, bending, lifting   PLOF: Independent   PATIENT GOALS : walk better     OBJECTIVE:    DIAGNOSTIC FINDINGS:    PATIENT SURVEYS:  07/24/21 FOTO 23% functional intake   COGNITION:           Overall cognitive status: Within functional limits for tasks assessed  SENSATION:     EDEMA:  07/24/21 Moderate edema in Rt knee, no other signs of infection noted   MUSCLE LENGTH: Tightness noted in quads and hamsrings on Rt   POSTURE:    PALPATION:     LOWER EXTREMITY ROM:    AROM/PROM Right eval Right  6/01/25/22 Right 08/13/21  Hip flexion       Hip extension       Hip abduction       Hip adduction       Hip internal rotation       Hip external rotation       Knee flexion 110/115  115/120 /120  Knee extension 8/5  6/4 5/3  Ankle dorsiflexion       Ankle plantarflexion       Ankle inversion       Ankle eversion        (Blank rows = not tested)   LOWER EXTREMITY MMT:   MMT in sitting Right eval  Left eval Right 08/25/21  Hip flexion 3+     Hip extension       Hip abduction 4     Hip adduction       Hip internal rotation       Hip external rotation       Knee flexion 4 5 4   Knee extension 3+ 5 4  Ankle dorsiflexion       Ankle plantarflexion       Ankle inversion       Ankle eversion        (Blank rows = not tested)   LOWER EXTREMITY SPECIAL TESTS:      FUNCTIONAL TESTS:      GAIT: 07/24/21 Distance walked: ambulates in with RW that was too short and was properly adjusted for her. She was then able to progress to [facility] 25 feet X 2 with cues and demo for technique. Close supervision needed. 07/28/21 Arrives with new front wheel RW that was also too short for her so this was adjusted to proper height and tennis balls added. Then Gait with SPC 150 feet with supervision and cues/demo for technique 07/30/21 Gait with SPC 150 feet with supervision and cues/demo for step through pattern vs her preferred step to pattern 08/06/21 Gait with SPC 150 feet with supervision and did not need cues for technique and step through pattern 08/13/21 Gait no AD 100 feet with supervision 08/25/21 Gait no AD 100 feet X2 with supervision 08/27/21 Gait no AD 100 feet X2 with supervision         TODAY'S TREATMENT: 08/27/21  -Bike L3 X 10 min -Gait without AD, see above -Leg press DL 100# 30 X 2, then Rt leg only 37# 2X15 -march walking down and then back backwards with one UE support 3 round trips at counter top -sidestepping 3 round trips without UE support at counter top -leg extension machine eccentrics (up with with both and down with Rt only) 5# 3X10 -hamstring curl machine DL 25# 2X15 -Tandem balance 30 sec X2 bilat -Rt hamstring stretch seated 30 sec X 3 -Slantboard stretch 30 sec X3 -Heel and toe raises  X15 bilat -Rt heel prop for knee extension stretch 5 min during first part of vaso  Modalities: Vasopnuematic to Rt knee X 10 min, medium compression 34 deg  08/25/21 Nu step L6 X 10  min Gait without AD, see above In bars for march walking down and then back backwards with bilat UE support 5 round  trips In bars for sidestepping 5 round trips without UE support Step ups in bars with bilat UE support 6 inch step, up with left and down in front with right X10, also lateral X 10 bilat Tandem balance 30 sec X2 bilat Rt hamstring stretch seated 30 sec X 3 Slantboard stretch 30 sec X3 Sit to stand using UE support to push up then slow eccentrics without UE support to lower X 10 Seated LAQ 5# 3X10 on Rt Rt heel prop for knee extension stretch 4 min during first part of vaso  Modalities: Vasopnuematic to Rt knee X 10 min, medium compression 34 deg    PATIENT EDUCATION:  Education details: HEP, PT plan of care Person educated: Patient Education method: Explanation, Demonstration, Verbal cues, and Handouts Education comprehension: verbalized understanding, returned demonstration, and needs further education     HOME EXERCISE PROGRAM: Access Code: [email or web address] URL: [email or web address] Date: 07/24/2021 Prepared by: Ivery Quale   Exercises - Quad Setting and Stretching  - 2 x daily - 6 x weekly - 2 sets - 10 reps - Seated Knee Flexion Stretch  - 2 x daily - 6 x weekly - 1-2 sets - 10 reps - 5 sec hold - Seated Hamstring Stretch  - 2 x daily - 6 x weekly - 1 sets - 3 reps - 30 hold - Seated Straight Leg Heel Taps  - 2 x daily - 6 x weekly - 3 sets - 5 reps - Mini Squat with Counter Support  - 2 x daily - 6 x weekly - 1-2 sets - 10 reps     ASSESSMENT:   CLINICAL IMPRESSION:She arrives today without RW and uses SPC instead, relays no issues with this progression and we have her ambulate in clinic without AD. We continued to work to improve her Rt knee strength, ROM, balance, and gait as tolerated.   OBJECTIVE IMPAIRMENTS: decreased activity tolerance, difficulty walking, decreased balance, decreased endurance, decreased mobility, decreased ROM, decreased  strength, impaired flexibility, impaired LE use, postural dysfunction, and pain.   ACTIVITY LIMITATIONS: bending, lifting, carry, locomotion, cleaning, community activity, driving, and or occupation   PERSONAL FACTORS:  Rt TKA, lumbar fusion,asthma, chronic pain,vertigo, sciatica,insomnia,sleep apnea are also affecting patient's functional outcome.   REHAB POTENTIAL: Good   CLINICAL DECISION MAKING: Stable/uncomplicated   EVALUATION COMPLEXITY: Low       GOALS: Short term PT Goals Target date: 08/21/2021 Pt will be I and compliant with HEP. Baseline:  Goal status: ongoing Pt will improve gait from RW to [facility] no more than supervision for 300 feet Baseline: Goal status:ongoing, can go 150 feet as of 07/30/21   Long term PT goals Target date: 09/18/2021 Pt will improve Rt knee PROM 0-120 to improve functional mobility Baseline: Goal status: ongoing, 4-120 on 08/04/21 Pt will improve  Rt hip/knee strength to at least 5-/5 MMT to improve functional strength Baseline: Goal status: ongoing Pt will improve FOTO to at least 49% functional to show improved function Baseline: Goal status: ongoing Pt will reduce pain to overall less than 2-3/10 with usual activity and work activity. Baseline: Goal status: ongoing Pt will be able to ambulate community distances independently at least 750 ft WNL gait pattern without complaints Baseline: Goal status: ongoing   PLAN: PT FREQUENCY: 1-3 times per week    PT DURATION: 6-8 weeks   PLANNED INTERVENTIONS (unless contraindicated): aquatic PT, Canalith repositioning, cryotherapy, Electrical stimulation, Iontophoresis with 4 mg/ml dexamethasome, Moist heat, traction, Ultrasound, gait training, Therapeutic exercise, balance training, neuromuscular  re-education, patient/family education, prosthetic training, manual techniques, passive ROM, dry needling, taping, vasopnuematic device, vestibular, spinal manipulations, joint manipulations   PLAN FOR NEXT  SESSION: knee extension ROM emphasis and quad strength to tolerance, progress gait to no AD as able.    Debbe Odea, PT,DPT 08/27/2021, 11:40 AM

## 2021-08-29 ENCOUNTER — Ambulatory Visit (INDEPENDENT_AMBULATORY_CARE_PROVIDER_SITE_OTHER): Payer: PRIVATE HEALTH INSURANCE | Admitting: Family Medicine

## 2021-08-29 ENCOUNTER — Encounter: Payer: Self-pay | Admitting: Family Medicine

## 2021-08-29 VITALS — BP 124/68 | HR 85 | Temp 97.4°F | Ht 72.0 in | Wt 239.6 lb

## 2021-08-29 DIAGNOSIS — J4541 Moderate persistent asthma with (acute) exacerbation: Secondary | ICD-10-CM

## 2021-08-29 DIAGNOSIS — Z23 Encounter for immunization: Secondary | ICD-10-CM

## 2021-08-29 DIAGNOSIS — Z96651 Presence of right artificial knee joint: Secondary | ICD-10-CM | POA: Diagnosis not present

## 2021-08-29 MED ORDER — FLUTICASONE PROPIONATE HFA 220 MCG/ACT IN AERO: 1.0000 | INHALATION_SPRAY | Freq: Two times a day (BID) | RESPIRATORY_TRACT | 12 refills | Status: DC

## 2021-08-29 MED ORDER — PREDNISONE 20 MG PO TABS: 40.0000 mg | ORAL_TABLET | Freq: Every day | ORAL | 0 refills | Status: DC

## 2021-08-29 NOTE — Addendum Note (Signed)
Addended by: Waymond Cera on: 08/29/2021 02:22 PM   Modules accepted: Orders

## 2021-08-29 NOTE — Progress Notes (Signed)
[facility] [address] [place] [address] Dept: [phone] Dept Fax: [phone]  Office Visit  Subjective:    Patient ID: Jessica Huff, female    DOB: 10-27-1960, 61 y.o..   MRN: [id number]  Chief Complaint  Patient presents with   Acute Visit    C/o having issues with having Asthma issues x 3-4 months.     History of Present Illness:  Patient is in today complaining of a recent flare of her asthma symptoms. She notes she has been needing to use her albuterol inhaler multiple times a day. She has had difficulty sleeping at times. She feels this has been flared up ever since she had her knee joint replacement in May. She is engaged in physical therapy. She is supposed ot return to work on Aug 3rd, but has some concerns about whether she will be ready for this.  Past Medical History: Patient Active Problem List   Diagnosis Date Noted   Total knee replacement status, right 06/25/2021   Tear of lateral meniscus of right knee 03/19/2021   Unilateral primary osteoarthritis, right knee 03/19/2021   Acute pain of right knee 02/07/2021   Malaise and fatigue 02/07/2021   Prediabetes 10/19/2018   S/P lumbar fusion 09/16/2018   Asthma in adult, moderate persistent, uncomplicated 07/03/2016   Chronic pain of right ankle 03/02/2016   Allergic rhinitis 11/29/2015   Class 1 obesity due to excess calories with serious comorbidity and body mass index (BMI) of 32.0 to 32.9 in adult 11/29/2015   Benign positional vertigo 04/07/2015   Chronic bilateral low back pain with sciatica 04/07/2015   Common migraine 04/07/2015   Hyperlipidemia 04/07/2015   Essential hypertension 04/07/2015   Insomnia 04/07/2015   Obstructive sleep apnea 04/07/2015   Lumbar radiculopathy 04/07/2015   Anxiety 04/07/2015   Past Surgical History:  Procedure Laterality Date   ANKLE SURGERY Right 2018   LUMBAR FUSION  2020   L4-L5   TOTAL KNEE  ARTHROPLASTY Right 06/25/2021   Procedure: RIGHT TOTAL KNEE ARTHROPLASTY;  Surgeon: Nadara Mustard, MD;  Location: [facility] OR;  Service: Orthopedics;  Laterality: Right;   Family History  Problem Relation Age of Onset   Diabetes Mother    Kidney disease Mother    Cancer Father        Kidney   Outpatient Medications Prior to Visit  Medication Sig Dispense Refill   albuterol (VENTOLIN HFA) 108 (90 Base) MCG/ACT inhaler Inhale 2 puffs into the lungs every 6 (six) hours as needed. (Patient taking differently: Inhale 2 puffs into the lungs every 6 (six) hours as needed for shortness of breath or wheezing.) 6.7 g 6   amitriptyline (ELAVIL) 25 MG tablet Take 1 tablet (25 mg total) by mouth at bedtime. 30 tablet 4   ezetimibe (ZETIA) 10 MG tablet Take 1 tablet (10 mg total) by mouth daily. 90 tablet 3   fluticasone (FLONASE) 50 MCG/ACT nasal spray SHAKE LIQUID AND USE 2 SPRAYS IN EACH NOSTRIL DAILY (Patient taking differently: Place 2 sprays into both nostrils daily as needed for allergies or rhinitis.) 16 g 6   levocetirizine (XYZAL) 5 MG tablet Take 5 mg by mouth daily as needed for allergies.     losartan-hydrochlorothiazide (HYZAAR) 100-25 MG tablet TAKE 1 TABLET BY MOUTH DAILY 90 tablet 3   montelukast (SINGULAIR) 10 MG tablet Take 1 tablet (10 mg total) by mouth at bedtime. 30 tablet 1   Multiple Vitamin (MULTI-VITAMIN) tablet Take 1 tablet  by mouth daily.     oxyCODONE-acetaminophen (PERCOCET/ROXICET) 5-325 MG tablet Take 1 tablet by mouth every 8 (eight) hours as needed. 30 tablet 0   pregabalin (LYRICA) 100 MG capsule Take 1 capsule (100 mg total) by mouth daily. Day or night for 1 week, then increase to 100 mg BID- for nerve pain 60 capsule 5   rosuvastatin (CRESTOR) 40 MG tablet TAKE 1 TABLET(40 MG) BY MOUTH DAILY 90 tablet 3   fluticasone (FLOVENT HFA) 110 MCG/ACT inhaler Inhale 1 puff into the lungs 2 (two) times daily. (Patient taking differently: Inhale 1 puff into the lungs 2 (two) times  daily as needed (shortness of breath).) 1 each 12   No facility-administered medications prior to visit.   No Known Allergies    Objective:   Today's Vitals   08/29/21 1344  BP: 124/68  Pulse: 85  Temp: (!) 97.4 F (36.3 C)  TempSrc: Temporal  SpO2: 98%  Weight: 239 lb 9.6 oz (108.7 kg)  Height: 6' (1.829 m)   Body mass index is 32.5 kg/m.   General: Well developed, well nourished. No acute distress. Lungs: Expiratory wheeze with prolonged expiratory phase. Extremities: Longitudinal surgical scar of right knee is healing well. Psych: Alert and oriented. Normal mood and affect.  Health Maintenance Due  Topic Date Due   HIV Screening  Never done   Hepatitis C Screening  Never done   Zoster Vaccines- Shingrix (2 of 2) 04/11/2021     Assessment & Plan:   1. Moderate persistent asthma with exacerbation Ms. Tetzlaff is having a flare of her asthma. I will prescribe a course of steroids. I will also increase her Flovent to the 220 mcg/inh. I will plan to see her back in 2 weeks to reassess.  - predniSONE (DELTASONE) 20 MG tablet; Take 2 tablets (40 mg total) by mouth daily with breakfast.  Dispense: 20 tablet; Refill: 0 - fluticasone (FLOVENT HFA) 220 MCG/ACT inhaler; Inhale 1 puff into the lungs in the morning and at bedtime.  Dispense: 1 each; Refill: 12  2. Knee joint replacement status, right Appears to be healing well. Continue physical therapy and working with orthopedics.  Return in about 2 weeks (around 09/12/2021) for Reassessment.   Loyola Mast, MD

## 2021-09-01 ENCOUNTER — Ambulatory Visit (INDEPENDENT_AMBULATORY_CARE_PROVIDER_SITE_OTHER): Payer: PRIVATE HEALTH INSURANCE | Admitting: Physical Therapy

## 2021-09-01 ENCOUNTER — Encounter: Payer: Self-pay | Admitting: Physical Therapy

## 2021-09-01 DIAGNOSIS — R600 Localized edema: Secondary | ICD-10-CM

## 2021-09-01 DIAGNOSIS — M25561 Pain in right knee: Secondary | ICD-10-CM | POA: Diagnosis not present

## 2021-09-01 DIAGNOSIS — M6281 Muscle weakness (generalized): Secondary | ICD-10-CM

## 2021-09-01 DIAGNOSIS — R262 Difficulty in walking, not elsewhere classified: Secondary | ICD-10-CM

## 2021-09-01 NOTE — Therapy (Signed)
OUTPATIENT PHYSICAL THERAPY TREATMENT NOTE   Patient Name: Jessica Huff MRN: [id number] DOB:11-Jul-1960, 61 y.o., female Today's Date: 7/10/2023a  END OF SESSION:   PT End of Session - 09/01/21 1053     Visit Number 9    Number of Visits 15    Date for PT Re-Evaluation 09/18/21    PT Start Time 1054    PT Stop Time 1142    PT Time Calculation (min) 48 min    Activity Tolerance Patient tolerated treatment well    Behavior During Therapy [facility] for tasks assessed/performed              Past Medical History:  Diagnosis Date   Asthma    GERD (gastroesophageal reflux disease)    Hyperlipidemia    Hypertension    Past Surgical History:  Procedure Laterality Date   ANKLE SURGERY Right 2018   LUMBAR FUSION  2020   L4-L5   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   Procedure: RIGHT TOTAL KNEE ARTHROPLASTY;  Surgeon: Newt Minion, MD;  Location: [place];  Service: Orthopedics;  Laterality: Right;   Patient Active Problem List   Diagnosis Date Noted   Total knee replacement status, right 06/25/2021   Tear of lateral meniscus of right knee 03/19/2021   Unilateral primary osteoarthritis, right knee 03/19/2021   Acute pain of right knee 02/07/2021   Malaise and fatigue 02/07/2021   Prediabetes 10/19/2018   S/P lumbar fusion 09/16/2018   Asthma in adult, moderate persistent, uncomplicated 57/32/2025   Chronic pain of right ankle 03/02/2016   Allergic rhinitis 11/29/2015   Class 1 obesity due to excess calories with serious comorbidity and body mass index (BMI) of 32.0 to 32.9 in adult 11/29/2015   Benign positional vertigo 04/07/2015   Chronic bilateral low back pain with sciatica 04/07/2015   Common migraine 04/07/2015   Hyperlipidemia 04/07/2015   Essential hypertension 04/07/2015   Insomnia 04/07/2015   Obstructive sleep apnea 04/07/2015   Lumbar radiculopathy 04/07/2015   Anxiety 04/07/2015     THERAPY DIAG:  Acute pain of right knee  Difficulty in walking, not  elsewhere classified  Muscle weakness (generalized)  Localized edema    PCP: Haydee Salter, MD   REFERRING PROVIDER: Suzan Slick, NP   REFERRING DIAG: [phone] (ICD-10-CM) - Status post total right knee replacement    Rationale for Evaluation and Treatment Rehabilitation   ONSET DATE: Rt TKA 06/25/21   SUBJECTIVE:    SUBJECTIVE STATEMENT: She relays she was having difficuly with asthma but saw MD for this and is on prescription med which is helping.   PERTINENT HISTORY:Rt TKA, lumbar fusion,asthma, chronic pain,vertigo, sciatica,insomnia,sleep apnea     PAIN:  Are you having pain? Yes: 2/10 upon arrival Pain location: medial and posterior Rt knee Pain description: sharp and throbbing Aggravating factors: straightening her knee, steps Relieving factors:  ice   PRECAUTIONS: None   WEIGHT BEARING RESTRICTIONS No   FALLS:  Has patient fallen in last 6 months? No   LIVING ENVIRONMENT: Around 7 steps inside to bedroom   OCCUPATION: works for [name] involving standing, walking, bending, lifting   PLOF: Independent   PATIENT GOALS : walk better     OBJECTIVE:    DIAGNOSTIC FINDINGS:    PATIENT SURVEYS:  07/24/21 FOTO 23% functional intake   COGNITION:           Overall cognitive status: Within functional limits for tasks assessed  SENSATION:     EDEMA:  07/24/21 Moderate edema in Rt knee, no other signs of infection noted   MUSCLE LENGTH: Tightness noted in quads and hamsrings on Rt   POSTURE:    PALPATION:     LOWER EXTREMITY ROM:    AROM/PROM Right eval Right  6/01/25/22 Right 08/13/21 Right 09/01/21  Hip flexion        Hip extension        Hip abduction        Hip adduction        Hip internal rotation        Hip external rotation        Knee flexion 110/115  115/120 /120 122/  Knee extension 8/5  6/4 5/3 3/2  Ankle dorsiflexion        Ankle plantarflexion        Ankle inversion         Ankle eversion         (Blank rows = not tested)   LOWER EXTREMITY MMT:   MMT in sitting Right eval Left eval Right 08/25/21  Hip flexion 3+     Hip extension       Hip abduction 4     Hip adduction       Hip internal rotation       Hip external rotation       Knee flexion '4 5 4  ' Knee extension 3+ 5 4  Ankle dorsiflexion       Ankle plantarflexion       Ankle inversion       Ankle eversion        (Blank rows = not tested)   LOWER EXTREMITY SPECIAL TESTS:      FUNCTIONAL TESTS:      GAIT: 07/24/21 Distance walked: ambulates in with RW that was too short and was properly adjusted for her. She was then able to progress to [facility] 25 feet X 2 with cues and demo for technique. Close supervision needed. 07/28/21 Arrives with new front wheel RW that was also too short for her so this was adjusted to proper height and tennis balls added. Then Gait with SPC 150 feet with supervision and cues/demo for technique 07/30/21 Gait with SPC 150 feet with supervision and cues/demo for step through pattern vs her preferred step to pattern 08/06/21 Gait with SPC 150 feet with supervision and did not need cues for technique and step through pattern 08/13/21 Gait no AD 100 feet with supervision 08/25/21 Gait no AD 100 feet X2 with supervision 08/27/21 Gait no AD 100 feet X2 with supervision 09/01/21 08/27/21 Gait no AD 125 feet X2 with supervision + dynamic balance activities listed below       TODAY'S TREATMENT: 09/01/21 -Nu step L5 X 10 min -Gait without AD, see above -Leg press DL 100# 30 X 2, then Rt leg only 43# 2X15 -march walking down and then back backwards without  UE support 3 round trips in bars -tandem walk without UE support in bars 3 round trips -leg extension machine eccentrics (up with with both and down with Rt only) 5# 3X10 -hamstring curl machine DL 25# 2X15 -Rt hamstring stretch seated 30 sec X 3 -Slantboard stretch 30 sec X3 -Sit to stands with UE support to push up then slow  eccentrics no hands to sit down X10 -Rt heel prop for knee extension stretch 5 min during first part of vaso  Modalities: Vasopnuematic to Rt knee X 10 min, medium  compression 34 deg  08/27/21  -Bike L3 X 10 min -Gait without AD, see above -Leg press DL 100# 30 X 2, then Rt leg only 37# 2X15 -march walking down and then back backwards with one UE support 3 round trips at counter top -sidestepping 3 round trips without UE support at counter top -leg extension machine eccentrics (up with with both and down with Rt only) 5# 3X10 -hamstring curl machine DL 25# 2X15 -Tandem balance 30 sec X2 bilat -Rt hamstring stretch seated 30 sec X 3 -Slantboard stretch 30 sec X3 -Heel and toe raises  X15 bilat -Rt heel prop for knee extension stretch 5 min during first part of vaso  Modalities: Vasopnuematic to Rt knee X 10 min, medium compression 34 deg  08/25/21 Nu step L6 X 10 min Gait without AD, see above In bars for march walking down and then back backwards with bilat UE support 5 round trips In bars for sidestepping 5 round trips without UE support Step ups in bars with bilat UE support 6 inch step, up with left and down in front with right X10, also lateral X 10 bilat Tandem balance 30 sec X2 bilat Rt hamstring stretch seated 30 sec X 3 Slantboard stretch 30 sec X3 Sit to stand using UE support to push up then slow eccentrics without UE support to lower X 10 Seated LAQ 5# 3X10 on Rt Rt heel prop for knee extension stretch 4 min during first part of vaso  Modalities: Vasopnuematic to Rt knee X 10 min, medium compression 34 deg    PATIENT EDUCATION:  Education details: HEP, PT plan of care Person educated: Patient Education method: Explanation, Demonstration, Verbal cues, and Handouts Education comprehension: verbalized understanding, returned demonstration, and needs further education     HOME EXERCISE PROGRAM: Access Code: [email or web address] URL: [email or web address] Date:  07/24/2021 Prepared by: Elsie Ra   Exercises - Quad Setting and Stretching  - 2 x daily - 6 x weekly - 2 sets - 10 reps - Seated Knee Flexion Stretch  - 2 x daily - 6 x weekly - 1-2 sets - 10 reps - 5 sec hold - Seated Hamstring Stretch  - 2 x daily - 6 x weekly - 1 sets - 3 reps - 30 hold - Seated Straight Leg Heel Taps  - 2 x daily - 6 x weekly - 3 sets - 5 reps - Mini Squat with Counter Support  - 2 x daily - 6 x weekly - 1-2 sets - 10 reps     ASSESSMENT:   CLINICAL IMPRESSION: She is adjusting to not using the RW any more and only using the cane. I have her ambulate in clinic without AD and have her work on dynamic balance activities to try to wean her off from [facility] as able. Her ROM is doing great, she has met flexion goal and only missing 2-3 deg of full knee extension. She will continue to benefit from more strengthening and gait training to improve her overall function.   OBJECTIVE IMPAIRMENTS: decreased activity tolerance, difficulty walking, decreased balance, decreased endurance, decreased mobility, decreased ROM, decreased strength, impaired flexibility, impaired LE use, postural dysfunction, and pain.   ACTIVITY LIMITATIONS: bending, lifting, carry, locomotion, cleaning, community activity, driving, and or occupation   PERSONAL FACTORS:  Rt TKA, lumbar fusion,asthma, chronic pain,vertigo, sciatica,insomnia,sleep apnea are also affecting patient's functional outcome.   REHAB POTENTIAL: Good   CLINICAL DECISION MAKING: Stable/uncomplicated   EVALUATION COMPLEXITY: Low  GOALS: Short term PT Goals Target date: 08/21/2021 Pt will be I and compliant with HEP. Baseline:  Goal status: ongoing Pt will improve gait from RW to [facility] no more than supervision for 300 feet Baseline: Goal status:ongoing, can go 150 feet as of 07/30/21   Long term PT goals Target date: 09/18/2021 Pt will improve Rt knee PROM 0-120 to improve functional mobility Baseline: Goal status: ongoing,  4-120 on 08/04/21 Pt will improve  Rt hip/knee strength to at least 5-/5 MMT to improve functional strength Baseline: Goal status: ongoing Pt will improve FOTO to at least 49% functional to show improved function Baseline: Goal status: ongoing Pt will reduce pain to overall less than 2-3/10 with usual activity and work activity. Baseline: Goal status: ongoing Pt will be able to ambulate community distances independently at least 750 ft WNL gait pattern without complaints Baseline: Goal status: ongoing   PLAN: PT FREQUENCY: 1-3 times per week    PT DURATION: 6-8 weeks   PLANNED INTERVENTIONS (unless contraindicated): aquatic PT, Canalith repositioning, cryotherapy, Electrical stimulation, Iontophoresis with 4 mg/ml dexamethasome, Moist heat, traction, Ultrasound, gait training, Therapeutic exercise, balance training, neuromuscular re-education, patient/family education, prosthetic training, manual techniques, passive ROM, dry needling, taping, vasopnuematic device, vestibular, spinal manipulations, joint manipulations   PLAN FOR NEXT SESSION: knee extension ROM emphasis and quad strength to tolerance, progress gait to no AD as able.    Debbe Odea, PT,DPT 09/01/2021, 11:07 AM

## 2021-09-03 ENCOUNTER — Ambulatory Visit (INDEPENDENT_AMBULATORY_CARE_PROVIDER_SITE_OTHER): Payer: PRIVATE HEALTH INSURANCE | Admitting: Physical Therapy

## 2021-09-03 ENCOUNTER — Encounter: Payer: Self-pay | Admitting: Physical Therapy

## 2021-09-03 DIAGNOSIS — M25561 Pain in right knee: Secondary | ICD-10-CM | POA: Diagnosis not present

## 2021-09-03 DIAGNOSIS — R600 Localized edema: Secondary | ICD-10-CM

## 2021-09-03 DIAGNOSIS — R262 Difficulty in walking, not elsewhere classified: Secondary | ICD-10-CM | POA: Diagnosis not present

## 2021-09-03 DIAGNOSIS — M6281 Muscle weakness (generalized): Secondary | ICD-10-CM

## 2021-09-03 NOTE — Therapy (Signed)
OUTPATIENT PHYSICAL THERAPY TREATMENT NOTE   Patient Name: Jessica Huff MRN: [id number] DOB:[birth year], 61 y.o., female Today's Date: 7/12/2023a  END OF SESSION:   PT End of Session - 09/03/21 1057     Visit Number 10    Number of Visits 15    Date for PT Re-Evaluation 09/18/21    PT Start Time 1056    PT Stop Time 1144    PT Time Calculation (min) 48 min    Activity Tolerance Patient tolerated treatment well    Behavior During Therapy WFL for tasks assessed/performed              Past Medical History:  Diagnosis Date   Asthma    GERD (gastroesophageal reflux disease)    Hyperlipidemia    Hypertension    Past Surgical History:  Procedure Laterality Date   ANKLE SURGERY Right 2018   LUMBAR FUSION  2020   L4-L5   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   Procedure: RIGHT TOTAL KNEE ARTHROPLASTY;  Surgeon: Nadara Mustard, MD;  Location: [facility];  Service: Orthopedics;  Laterality: Right;   Patient Active Problem List   Diagnosis Date Noted   Total knee replacement status, right 06/25/2021   Tear of lateral meniscus of right knee 03/19/2021   Unilateral primary osteoarthritis, right knee 03/19/2021   Acute pain of right knee 02/07/2021   Malaise and fatigue 02/07/2021   Prediabetes 10/19/2018   S/P lumbar fusion 09/16/2018   Asthma in adult, moderate persistent, uncomplicated 07/03/2016   Chronic pain of right ankle 03/02/2016   Allergic rhinitis 11/29/2015   Class 1 obesity due to excess calories with serious comorbidity and body mass index (BMI) of 32.0 to 32.9 in adult 11/29/2015   Benign positional vertigo 04/07/2015   Chronic bilateral low back pain with sciatica 04/07/2015   Common migraine 04/07/2015   Hyperlipidemia 04/07/2015   Essential hypertension 04/07/2015   Insomnia 04/07/2015   Obstructive sleep apnea 04/07/2015   Lumbar radiculopathy 04/07/2015   Anxiety 04/07/2015     THERAPY DIAG:  Acute pain of right knee  Difficulty in walking, not  elsewhere classified  Muscle weakness (generalized)  Localized edema    PCP: Loyola Mast, MD   REFERRING PROVIDER: Adonis Huguenin, NP   REFERRING DIAG: [phone] (ICD-10-CM) - Status post total right knee replacement    Rationale for Evaluation and Treatment Rehabilitation   ONSET DATE: Rt TKA 06/25/21   SUBJECTIVE:    SUBJECTIVE STATEMENT: She relays she is supposed to return to work first of August, she has some concerns she might not be ready for this as she gets swelling in her knee and max tolerated standing time is about an hour right now, at work she would need to stand up all day for 8 hours.    PERTINENT HISTORY:Rt TKA, lumbar fusion,asthma, chronic pain,vertigo, sciatica,insomnia,sleep apnea     PAIN:  Are you having pain? Yes: 2/10 upon arrival Pain location: medial and posterior Rt knee Pain description: sharp and throbbing Aggravating factors: straightening her knee, steps Relieving factors:  ice   PRECAUTIONS: None   WEIGHT BEARING RESTRICTIONS No   FALLS:  Has patient fallen in last 6 months? 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, walking, bending, lifting   PLOF: Independent   PATIENT GOALS : walk better     OBJECTIVE:    DIAGNOSTIC FINDINGS:    PATIENT SURVEYS:  07/24/21 FOTO 23% functional intake   COGNITION:           Overall cognitive status: Within functional limits for tasks assessed                          SENSATION:     EDEMA:  07/24/21 Moderate edema in Rt knee, no other signs of infection noted   MUSCLE LENGTH: Tightness noted in quads and hamsrings on Rt   POSTURE:    PALPATION:     LOWER EXTREMITY ROM:    AROM/PROM Right eval Right  6/01/25/22 Right 08/13/21 Right 09/01/21  Hip flexion        Hip extension        Hip abduction        Hip adduction        Hip internal rotation        Hip external rotation        Knee  flexion 110/115  115/120 /120 122/  Knee extension 8/5  6/4 5/3 3/2  Ankle dorsiflexion        Ankle plantarflexion        Ankle inversion        Ankle eversion         (Blank rows = not tested)   LOWER EXTREMITY MMT:   MMT in sitting Right eval Left eval Right 08/25/21 Right 09/03/21  Hip flexion 3+      Hip extension        Hip abduction 4      Hip adduction        Hip internal rotation        Hip external rotation        Knee flexion 4 5 4 4   Knee extension 3+ 5 4 4+  Ankle dorsiflexion        Ankle plantarflexion        Ankle inversion        Ankle eversion         (Blank rows = not tested)   LOWER EXTREMITY SPECIAL TESTS:      FUNCTIONAL TESTS:      GAIT: 07/24/21 Distance walked: ambulates in with RW that was too short and was properly adjusted for her. She was then able to progress to [facility] 25 feet X 2 with cues and demo for technique. Close supervision needed. 07/28/21 Arrives with new front wheel RW that was also too short for her so this was adjusted to proper height and tennis balls added. Then Gait with SPC 150 feet with supervision and cues/demo for technique 07/30/21 Gait with SPC 150 feet with supervision and cues/demo for step through pattern vs her preferred step to pattern 08/06/21 Gait with SPC 150 feet with supervision and did not need cues for technique and step through pattern 08/13/21 Gait no AD 100 feet with supervision 08/25/21 Gait no AD 100 feet X2 with supervision 08/27/21 Gait no AD 100 feet X2 with supervision 09/01/21 08/27/21 Gait no AD 125 feet X2 with supervision + dynamic balance activities listed below       TODAY'S TREATMENT: 09/03/21 -Nu step L5 X 10 min -Gait without AD throughout session, with less overall supervision needed -Leg press DL 11/04/21 15 X 2, then Rt leg only 50# 2X15 -march walking down and then back backwards without  UE support 3 round trips in bars -tandem walk without UE support in bars 3 round trips -leg extension  machine  Rt  only 5# 2X10, then eccentrics (up with with both and down with Rt only) 10# X10 -hamstring curl machine DL 44# 0N02 -Rt hamstring stretch seated 30 sec X 3 -Sit to stands with UE support to push up then slow eccentrics no hands to sit down X10 -Rt heel prop for knee extension stretch 5 min during first part of vaso  Modalities: Vasopnuematic to Rt knee X 10 min, medium compression 34 deg     PATIENT EDUCATION:  Education details: HEP, PT plan of care Person educated: Patient Education method: Explanation, Demonstration, Verbal cues, and Handouts Education comprehension: verbalized understanding, returned demonstration, and needs further education     HOME EXERCISE PROGRAM: Access Code: [email or web address] URL: [email or web address] Date: 07/24/2021 Prepared by: Ivery Quale   Exercises - Quad Setting and Stretching  - 2 x daily - 6 x weekly - 2 sets - 10 reps - Seated Knee Flexion Stretch  - 2 x daily - 6 x weekly - 1-2 sets - 10 reps - 5 sec hold - Seated Hamstring Stretch  - 2 x daily - 6 x weekly - 1 sets - 3 reps - 30 hold - Seated Straight Leg Heel Taps  - 2 x daily - 6 x weekly - 3 sets - 5 reps - Mini Squat with Counter Support  - 2 x daily - 6 x weekly - 1-2 sets - 10 reps     ASSESSMENT:   CLINICAL IMPRESSION: She is ambulating more without an AD and will try next week to arrive without SPC. Updated strength measurements show progress but still with some weakness observed which we will continue to work to progress with PT.  OBJECTIVE IMPAIRMENTS: decreased activity tolerance, difficulty walking, decreased balance, decreased endurance, decreased mobility, decreased ROM, decreased strength, impaired flexibility, impaired LE use, postural dysfunction, and pain.   ACTIVITY LIMITATIONS: bending, lifting, carry, locomotion, cleaning, community activity, driving, and or occupation   PERSONAL FACTORS:  Rt TKA, lumbar fusion,asthma, chronic pain,vertigo,  sciatica,insomnia,sleep apnea are also affecting patient's functional outcome.   REHAB POTENTIAL: Good   CLINICAL DECISION MAKING: Stable/uncomplicated   EVALUATION COMPLEXITY: Low       GOALS: Short term PT Goals Target date: 08/21/2021 Pt will be I and compliant with HEP. Baseline:  Goal status: ongoing Pt will improve gait from RW to [facility] no more than supervision for 300 feet Baseline: Goal status:ongoing, can go 150 feet as of 07/30/21   Long term PT goals Target date: 09/18/2021 Pt will improve Rt knee PROM 0-120 to improve functional mobility Baseline: Goal status: ongoing, 4-120 on 08/04/21 Pt will improve  Rt hip/knee strength to at least 5-/5 MMT to improve functional strength Baseline: Goal status: ongoing Pt will improve FOTO to at least 49% functional to show improved function Baseline: Goal status: ongoing Pt will reduce pain to overall less than 2-3/10 with usual activity and work activity. Baseline: Goal status: ongoing Pt will be able to ambulate community distances independently at least 750 ft WNL gait pattern without complaints Baseline: Goal status: ongoing   PLAN: PT FREQUENCY: 1-3 times per week    PT DURATION: 6-8 weeks   PLANNED INTERVENTIONS (unless contraindicated): aquatic PT, Canalith repositioning, cryotherapy, Electrical stimulation, Iontophoresis with 4 mg/ml dexamethasome, Moist heat, traction, Ultrasound, gait training, Therapeutic exercise, balance training, neuromuscular re-education, patient/family education, prosthetic training, manual techniques, passive ROM, dry needling, taping, vasopnuematic device, vestibular, spinal manipulations, joint manipulations   PLAN FOR NEXT SESSION: knee extension ROM emphasis and quad strength to tolerance, progress  gait to no AD as able.    April Manson, PT,DPT 09/03/2021, 11:30 AM

## 2021-09-04 ENCOUNTER — Telehealth: Payer: Self-pay

## 2021-09-04 ENCOUNTER — Other Ambulatory Visit: Payer: Self-pay | Admitting: Family Medicine

## 2021-09-04 DIAGNOSIS — J4540 Moderate persistent asthma, uncomplicated: Secondary | ICD-10-CM

## 2021-09-04 DIAGNOSIS — E785 Hyperlipidemia, unspecified: Secondary | ICD-10-CM

## 2021-09-04 NOTE — Telephone Encounter (Signed)
PA for Fluticasone HFA 220 mcg submitted through [facility] to [facility].   Awaiting response. Dm/cma   Key: [id number]

## 2021-09-05 NOTE — Telephone Encounter (Signed)
Will work on Electronics engineer to get covered.  Dm/cma

## 2021-09-05 NOTE — Telephone Encounter (Signed)
PA denied due not meeting clinical requirements.   Is there something else that can be sent in for her?  Please review and advise.  Thanks.  Dm/cma

## 2021-09-08 ENCOUNTER — Encounter: Payer: Self-pay | Admitting: Physical Therapy

## 2021-09-08 ENCOUNTER — Ambulatory Visit (INDEPENDENT_AMBULATORY_CARE_PROVIDER_SITE_OTHER): Payer: PRIVATE HEALTH INSURANCE | Admitting: Physical Therapy

## 2021-09-08 DIAGNOSIS — M6281 Muscle weakness (generalized): Secondary | ICD-10-CM

## 2021-09-08 DIAGNOSIS — R262 Difficulty in walking, not elsewhere classified: Secondary | ICD-10-CM | POA: Diagnosis not present

## 2021-09-08 DIAGNOSIS — M25561 Pain in right knee: Secondary | ICD-10-CM

## 2021-09-08 DIAGNOSIS — R600 Localized edema: Secondary | ICD-10-CM | POA: Diagnosis not present

## 2021-09-08 NOTE — Therapy (Signed)
OUTPATIENT PHYSICAL THERAPY TREATMENT NOTE   Patient Name: Jessica Huff MRN: [id number] DOB:April 02, 1960, 61 y.o., female Today's Date: 7/17/2023a  END OF SESSION:   PT End of Session - 09/08/21 1119     Visit Number 11    Number of Visits 15    Date for PT Re-Evaluation 09/18/21    PT Start Time 1100    PT Stop Time 1148    PT Time Calculation (min) 48 min    Activity Tolerance Patient tolerated treatment well    Behavior During Therapy WFL for tasks assessed/performed              Past Medical History:  Diagnosis Date   Asthma    GERD (gastroesophageal reflux disease)    Hyperlipidemia    Hypertension    Past Surgical History:  Procedure Laterality Date   ANKLE SURGERY Right 2018   LUMBAR FUSION  2020   L4-L5   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   Procedure: RIGHT TOTAL KNEE ARTHROPLASTY;  Surgeon: Nadara Mustard, MD;  Location: [facility] OR;  Service: Orthopedics;  Laterality: Right;   Patient Active Problem List   Diagnosis Date Noted   Total knee replacement status, right 06/25/2021   Tear of lateral meniscus of right knee 03/19/2021   Unilateral primary osteoarthritis, right knee 03/19/2021   Acute pain of right knee 02/07/2021   Malaise and fatigue 02/07/2021   Prediabetes 10/19/2018   S/P lumbar fusion 09/16/2018   Asthma in adult, moderate persistent, uncomplicated 07/03/2016   Chronic pain of right ankle 03/02/2016   Allergic rhinitis 11/29/2015   Class 1 obesity due to excess calories with serious comorbidity and body mass index (BMI) of 32.0 to 32.9 in adult 11/29/2015   Benign positional vertigo 04/07/2015   Chronic bilateral low back pain with sciatica 04/07/2015   Common migraine 04/07/2015   Hyperlipidemia 04/07/2015   Essential hypertension 04/07/2015   Insomnia 04/07/2015   Obstructive sleep apnea 04/07/2015   Lumbar radiculopathy 04/07/2015   Anxiety 04/07/2015     THERAPY DIAG:  Acute pain of right knee  Difficulty in walking, not  elsewhere classified  Muscle weakness (generalized)  Localized edema    PCP: Loyola Mast, MD   REFERRING PROVIDER: Adonis Huguenin, NP   REFERRING DIAG: [phone] (ICD-10-CM) - Status post total right knee replacement    Rationale for Evaluation and Treatment Rehabilitation   ONSET DATE: Rt TKA 06/25/21   SUBJECTIVE:    SUBJECTIVE STATEMENT: She relays she is supposed to return to work first of August, she has some concerns she might not be ready for this as she gets swelling in her knee and max tolerated standing time is about an hour right now, at work she would need to stand up all day for 8 hours.    PERTINENT HISTORY:Rt TKA, lumbar fusion,asthma, chronic pain,vertigo, sciatica,insomnia,sleep apnea     PAIN:  Are you having pain? Yes: 2/10 upon arrival Pain location: medial and posterior Rt knee Pain description: sharp and throbbing Aggravating factors: straightening her knee, steps Relieving factors:  ice   PRECAUTIONS: None   WEIGHT BEARING RESTRICTIONS No   FALLS:  Has patient fallen in last 6 months? 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, walking, bending, lifting   PLOF: Independent   PATIENT GOALS : walk better     OBJECTIVE:    DIAGNOSTIC FINDINGS:    PATIENT SURVEYS:  07/24/21 FOTO 23% functional intake   COGNITION:           Overall cognitive status: Within functional limits for tasks assessed                          SENSATION:     EDEMA:  07/24/21 Moderate edema in Rt knee, no other signs of infection noted   MUSCLE LENGTH: Tightness noted in quads and hamsrings on Rt   POSTURE:    PALPATION:     LOWER EXTREMITY ROM:    AROM/PROM Right eval Right  6/01/25/22 Right 08/13/21 Right 09/01/21  Hip flexion        Hip extension        Hip abduction        Hip adduction        Hip internal rotation        Hip external rotation        Knee  flexion 110/115  115/120 /120 122/  Knee extension 8/5  6/4 5/3 3/2  Ankle dorsiflexion        Ankle plantarflexion        Ankle inversion        Ankle eversion         (Blank rows = not tested)   LOWER EXTREMITY MMT:   MMT in sitting Right eval Left eval Right 08/25/21 Right 09/03/21  Hip flexion 3+      Hip extension        Hip abduction 4      Hip adduction        Hip internal rotation        Hip external rotation        Knee flexion 4 5 4 4   Knee extension 3+ 5 4 4+  Ankle dorsiflexion        Ankle plantarflexion        Ankle inversion        Ankle eversion         (Blank rows = not tested)   LOWER EXTREMITY SPECIAL TESTS:      FUNCTIONAL TESTS:      GAIT: 07/24/21 Distance walked: ambulates in with RW that was too short and was properly adjusted for her. She was then able to progress to [facility] 25 feet X 2 with cues and demo for technique. Close supervision needed. 07/28/21 Arrives with new front wheel RW that was also too short for her so this was adjusted to proper height and tennis balls added. Then Gait with SPC 150 feet with supervision and cues/demo for technique 07/30/21 Gait with SPC 150 feet with supervision and cues/demo for step through pattern vs her preferred step to pattern 08/06/21 Gait with SPC 150 feet with supervision and did not need cues for technique and step through pattern 08/13/21 Gait no AD 100 feet with supervision 08/25/21 Gait no AD 100 feet X2 with supervision 08/27/21 Gait no AD 100 feet X2 with supervision 09/01/21 08/27/21 Gait no AD 125 feet X2 with supervision + dynamic balance activities listed below       TODAY'S TREATMENT: 09/08/21 -Nu step L6 X 10 min -Gait without AD throughout session  -Leg press DL 09/10/21 15 X 2, then Rt leg only 50# 2X15 -march walking down and then back backwards without  UE support 2 round trips in bars -tandem walk without UE support in bars 3 round trips -leg extension machine  Rt only 5# 2X10, then eccentrics  (  up with with both and down with Rt only) 10# 2X10 -hamstring curl machine DL 60# 7P71 -Rt hamstring stretch seated 30 sec X 3 -Sit to stands with UE support to push up then slow eccentrics no hands to sit down X10 -Rt heel prop for knee extension stretch 5 min during first part of vaso  Modalities: Vasopnuematic to Rt knee X 10 min, medium compression 34 deg  09/03/21 -Nu step L5 X 10 min -Gait without AD throughout session, with less overall supervision needed -Leg press DL 062# 15 X 2, then Rt leg only 50# 2X15 -march walking down and then back backwards without  UE support 3 round trips in bars -tandem walk without UE support in bars 3 round trips -leg extension machine  Rt only 5# 2X10, then eccentrics (up with with both and down with Rt only) 10# X10 -hamstring curl machine DL 69# 4W54 -Rt hamstring stretch seated 30 sec X 3 -Sit to stands with UE support to push up then slow eccentrics no hands to sit down X10 -Rt heel prop for knee extension stretch 5 min during first part of vaso  Modalities: Vasopnuematic to Rt knee X 10 min, medium compression 34 deg     PATIENT EDUCATION:  Education details: HEP, PT plan of care Person educated: Patient Education method: Explanation, Demonstration, Verbal cues, and Handouts Education comprehension: verbalized understanding, returned demonstration, and needs further education     HOME EXERCISE PROGRAM: Access Code: [email or web address] URL: [email or web address] Date: 07/24/2021 Prepared by: Ivery Quale   Exercises - Quad Setting and Stretching  - 2 x daily - 6 x weekly - 2 sets - 10 reps - Seated Knee Flexion Stretch  - 2 x daily - 6 x weekly - 1-2 sets - 10 reps - 5 sec hold - Seated Hamstring Stretch  - 2 x daily - 6 x weekly - 1 sets - 3 reps - 30 hold - Seated Straight Leg Heel Taps  - 2 x daily - 6 x weekly - 3 sets - 5 reps - Mini Squat with Counter Support  - 2 x daily - 6 x weekly - 1-2 sets - 10 reps      ASSESSMENT:   CLINICAL IMPRESSION: She still has some observed functional weakness noted that we will continue to work to improve with PT. She is doing well in regards to pain and knee ROM.  OBJECTIVE IMPAIRMENTS: decreased activity tolerance, difficulty walking, decreased balance, decreased endurance, decreased mobility, decreased ROM, decreased strength, impaired flexibility, impaired LE use, postural dysfunction, and pain.   ACTIVITY LIMITATIONS: bending, lifting, carry, locomotion, cleaning, community activity, driving, and or occupation   PERSONAL FACTORS:  Rt TKA, lumbar fusion,asthma, chronic pain,vertigo, sciatica,insomnia,sleep apnea are also affecting patient's functional outcome.   REHAB POTENTIAL: Good   CLINICAL DECISION MAKING: Stable/uncomplicated   EVALUATION COMPLEXITY: Low       GOALS: Short term PT Goals Target date: 08/21/2021 Pt will be I and compliant with HEP. Baseline:  Goal status: ongoing Pt will improve gait from RW to [facility] no more than supervision for 300 feet Baseline: Goal status:ongoing, can go 150 feet as of 07/30/21   Long term PT goals Target date: 09/18/2021 Pt will improve Rt knee PROM 0-120 to improve functional mobility Baseline: Goal status: ongoing, 4-120 on 08/04/21 Pt will improve  Rt hip/knee strength to at least 5-/5 MMT to improve functional strength Baseline: Goal status: ongoing Pt will improve FOTO to at least 49% functional to show improved function Baseline:  Goal status: ongoing Pt will reduce pain to overall less than 2-3/10 with usual activity and work activity. Baseline: Goal status: ongoing Pt will be able to ambulate community distances independently at least 750 ft WNL gait pattern without complaints Baseline: Goal status: ongoing   PLAN: PT FREQUENCY: 1-3 times per week    PT DURATION: 6-8 weeks   PLANNED INTERVENTIONS (unless contraindicated): aquatic PT, Canalith repositioning, cryotherapy, Electrical stimulation,  Iontophoresis with 4 mg/ml dexamethasome, Moist heat, traction, Ultrasound, gait training, Therapeutic exercise, balance training, neuromuscular re-education, patient/family education, prosthetic training, manual techniques, passive ROM, dry needling, taping, vasopnuematic device, vestibular, spinal manipulations, joint manipulations   PLAN FOR NEXT SESSION: knee extension ROM emphasis and quad strength to tolerance, progress gait to no AD as able.    April Manson, PT,DPT 09/08/2021, 11:19 AM

## 2021-09-10 ENCOUNTER — Ambulatory Visit (INDEPENDENT_AMBULATORY_CARE_PROVIDER_SITE_OTHER): Payer: PRIVATE HEALTH INSURANCE | Admitting: Physical Therapy

## 2021-09-10 ENCOUNTER — Encounter: Payer: Self-pay | Admitting: Physical Therapy

## 2021-09-10 DIAGNOSIS — M6281 Muscle weakness (generalized): Secondary | ICD-10-CM | POA: Diagnosis not present

## 2021-09-10 DIAGNOSIS — R600 Localized edema: Secondary | ICD-10-CM | POA: Diagnosis not present

## 2021-09-10 DIAGNOSIS — R262 Difficulty in walking, not elsewhere classified: Secondary | ICD-10-CM | POA: Diagnosis not present

## 2021-09-10 DIAGNOSIS — M25561 Pain in right knee: Secondary | ICD-10-CM | POA: Diagnosis not present

## 2021-09-10 NOTE — Therapy (Signed)
OUTPATIENT PHYSICAL THERAPY TREATMENT NOTE   Patient Name: Joeli Fenner MRN: [id number] DOB:Sep 18, 1960, 61 y.o., female Today's Date: 7/19/2023a  END OF SESSION:   PT End of Session - 09/10/21 1127     Visit Number 12    Number of Visits 15    Date for PT Re-Evaluation 09/18/21    PT Start Time 1100    PT Stop Time 1148    PT Time Calculation (min) 48 min    Activity Tolerance Patient tolerated treatment well    Behavior During Therapy [facility] for tasks assessed/performed              Past Medical History:  Diagnosis Date   Asthma    GERD (gastroesophageal reflux disease)    Hyperlipidemia    Hypertension    Past Surgical History:  Procedure Laterality Date   ANKLE SURGERY Right 2018   LUMBAR FUSION  2020   L4-L5   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   Procedure: RIGHT TOTAL KNEE ARTHROPLASTY;  Surgeon: Nadara Mustard, MD;  Location: [facility] OR;  Service: Orthopedics;  Laterality: Right;   Patient Active Problem List   Diagnosis Date Noted   Total knee replacement status, right 06/25/2021   Tear of lateral meniscus of right knee 03/19/2021   Unilateral primary osteoarthritis, right knee 03/19/2021   Acute pain of right knee 02/07/2021   Malaise and fatigue 02/07/2021   Prediabetes 10/19/2018   S/P lumbar fusion 09/16/2018   Asthma in adult, moderate persistent, uncomplicated 07/03/2016   Chronic pain of right ankle 03/02/2016   Allergic rhinitis 11/29/2015   Class 1 obesity due to excess calories with serious comorbidity and body mass index (BMI) of 32.0 to 32.9 in adult 11/29/2015   Benign positional vertigo 04/07/2015   Chronic bilateral low back pain with sciatica 04/07/2015   Common migraine 04/07/2015   Hyperlipidemia 04/07/2015   Essential hypertension 04/07/2015   Insomnia 04/07/2015   Obstructive sleep apnea 04/07/2015   Lumbar radiculopathy 04/07/2015   Anxiety 04/07/2015     THERAPY DIAG:  Acute pain of right knee  Difficulty in walking, not  elsewhere classified  Muscle weakness (generalized)  Localized edema    PCP: Loyola Mast, MD   REFERRING PROVIDER: Adonis Huguenin, NP   REFERRING DIAG: [phone] (ICD-10-CM) - Status post total right knee replacement    Rationale for Evaluation and Treatment Rehabilitation   ONSET DATE: Rt TKA 06/25/21   SUBJECTIVE:    SUBJECTIVE STATEMENT: She relays she the knee is stiff and sore but not bad  PERTINENT HISTORY:Rt TKA, lumbar fusion,asthma, chronic pain,vertigo, sciatica,insomnia,sleep apnea     PAIN:  Are you having pain? Yes: 2/10 upon arrival Pain location: medial and posterior Rt knee Pain description: sharp and throbbing Aggravating factors: straightening her knee, steps Relieving factors:  ice   PRECAUTIONS: None   WEIGHT BEARING RESTRICTIONS No   FALLS:  Has patient fallen in last 6 months? No   LIVING ENVIRONMENT: Around 7 steps inside to bedroom   OCCUPATION: works for [employer] involving standing, walking, bending, lifting   PLOF: Independent   PATIENT GOALS : walk better     OBJECTIVE:    DIAGNOSTIC FINDINGS:    PATIENT SURVEYS:  07/24/21 FOTO 23% functional intake   COGNITION:           Overall cognitive status: Within functional limits for tasks assessed  SENSATION:     EDEMA:  07/24/21 Moderate edema in Rt knee, no other signs of infection noted   MUSCLE LENGTH: Tightness noted in quads and hamsrings on Rt   POSTURE:    PALPATION:     LOWER EXTREMITY ROM:    AROM/PROM Right eval Right  6/01/25/22 Right 08/13/21 Right 09/01/21  Hip flexion        Hip extension        Hip abduction        Hip adduction        Hip internal rotation        Hip external rotation        Knee flexion 110/115  115/120 /120 122/  Knee extension 8/5  6/4 5/3 3/2  Ankle dorsiflexion        Ankle plantarflexion        Ankle inversion        Ankle eversion         (Blank rows = not tested)   LOWER  EXTREMITY MMT:   MMT in sitting Right eval Left eval Right 08/25/21 Right 09/03/21  Hip flexion 3+      Hip extension        Hip abduction 4      Hip adduction        Hip internal rotation        Hip external rotation        Knee flexion 4 5 4 4   Knee extension 3+ 5 4 4+  Ankle dorsiflexion        Ankle plantarflexion        Ankle inversion        Ankle eversion         (Blank rows = not tested)   LOWER EXTREMITY SPECIAL TESTS:      FUNCTIONAL TESTS:      GAIT: 07/24/21 Distance walked: ambulates in with RW that was too short and was properly adjusted for her. She was then able to progress to [facility] 25 feet X 2 with cues and demo for technique. Close supervision needed. 07/28/21 Arrives with new front wheel RW that was also too short for her so this was adjusted to proper height and tennis balls added. Then Gait with SPC 150 feet with supervision and cues/demo for technique 07/30/21 Gait with SPC 150 feet with supervision and cues/demo for step through pattern vs her preferred step to pattern 08/06/21 Gait with SPC 150 feet with supervision and did not need cues for technique and step through pattern 08/13/21 Gait no AD 100 feet with supervision 08/25/21 Gait no AD 100 feet X2 with supervision 08/27/21 Gait no AD 100 feet X2 with supervision 09/01/21 08/27/21 Gait no AD 125 feet X2 with supervision + dynamic balance activities listed below       TODAY'S TREATMENT: 09/10/21 -recumbent bike  L3X 10 min -Gait without AD throughout session  -Leg press DL 09/12/21 15 X 2, then Rt leg only 50# 2X15 -march walking 3 round trips in bars -tandem walk 3 round trips in bars -Retro walk 3 round trips in bars -leg extension machine  Rt only 5# 2X10, then eccentrics (up with with both and down with Rt only) 10# 2X10 -hamstring curl machine DL 170# 01# -Rt hamstring stretch seated 30 sec X 3 -Sit to stands with UE support to push up then slow eccentrics no hands to sit down X10 -Rt heel prop for  knee extension stretch 5 min during first part of  vaso  Modalities: Vasopnuematic to Rt knee X 10 min, medium compression 34 deg  09/08/21 -Nu step L6 X 10 min -Gait without AD throughout session  -Leg press DL 893# 15 X 2, then Rt leg only 50# 2X15 -march walking down and then back backwards without  UE support 2 round trips in bars -tandem walk without UE support in bars 3 round trips -leg extension machine  Rt only 5# 2X10, then eccentrics (up with with both and down with Rt only) 10# 2X10 -hamstring curl machine DL 81# 0F75 -Rt hamstring stretch seated 30 sec X 3 -Sit to stands with UE support to push up then slow eccentrics no hands to sit down X10 -Rt heel prop for knee extension stretch 5 min during first part of vaso  Modalities: Vasopnuematic to Rt knee X 10 min, medium compression 34 deg  09/03/21 -Nu step L5 X 10 min -Gait without AD throughout session, with less overall supervision needed -Leg press DL 102# 15 X 2, then Rt leg only 50# 2X15 -march walking down and then back backwards without  UE support 3 round trips in bars -tandem walk without UE support in bars 3 round trips -leg extension machine  Rt only 5# 2X10, then eccentrics (up with with both and down with Rt only) 10# X10 -hamstring curl machine DL 58# 5I77 -Rt hamstring stretch seated 30 sec X 3 -Sit to stands with UE support to push up then slow eccentrics no hands to sit down X10 -Rt heel prop for knee extension stretch 5 min during first part of vaso  Modalities: Vasopnuematic to Rt knee X 10 min, medium compression 34 deg     PATIENT EDUCATION:  Education details: HEP, PT plan of care Person educated: Patient Education method: Explanation, Demonstration, Verbal cues, and Handouts Education comprehension: verbalized understanding, returned demonstration, and needs further education     HOME EXERCISE PROGRAM: Access Code: [email or web address] URL: [email or web address] Date: 07/24/2021 Prepared  by: Ivery Quale   Exercises - Quad Setting and Stretching  - 2 x daily - 6 x weekly - 2 sets - 10 reps - Seated Knee Flexion Stretch  - 2 x daily - 6 x weekly - 1-2 sets - 10 reps - 5 sec hold - Seated Hamstring Stretch  - 2 x daily - 6 x weekly - 1 sets - 3 reps - 30 hold - Seated Straight Leg Heel Taps  - 2 x daily - 6 x weekly - 3 sets - 5 reps - Mini Squat with Counter Support  - 2 x daily - 6 x weekly - 1-2 sets - 10 reps     ASSESSMENT:   CLINICAL IMPRESSION: We continue to work to improve her function with standing activity and ambulation without AD. She still has deficits and will continue to benefit from PT.  OBJECTIVE IMPAIRMENTS: decreased activity tolerance, difficulty walking, decreased balance, decreased endurance, decreased mobility, decreased ROM, decreased strength, impaired flexibility, impaired LE use, postural dysfunction, and pain.   ACTIVITY LIMITATIONS: bending, lifting, carry, locomotion, cleaning, community activity, driving, and or occupation   PERSONAL FACTORS:  Rt TKA, lumbar fusion,asthma, chronic pain,vertigo, sciatica,insomnia,sleep apnea are also affecting patient's functional outcome.   REHAB POTENTIAL: Good   CLINICAL DECISION MAKING: Stable/uncomplicated   EVALUATION COMPLEXITY: Low       GOALS: Short term PT Goals Target date: 08/21/2021 Pt will be I and compliant with HEP. Baseline:  Goal status: ongoing Pt will improve gait from RW to [facility] no more than supervision  for 300 feet Baseline: Goal status:ongoing, can go 150 feet as of 07/30/21   Long term PT goals Target date: 09/18/2021 Pt will improve Rt knee PROM 0-120 to improve functional mobility Baseline: Goal status: ongoing, 4-120 on 08/04/21 Pt will improve  Rt hip/knee strength to at least 5-/5 MMT to improve functional strength Baseline: Goal status: ongoing Pt will improve FOTO to at least 49% functional to show improved function Baseline: Goal status: ongoing Pt will reduce pain  to overall less than 2-3/10 with usual activity and work activity. Baseline: Goal status: ongoing Pt will be able to ambulate community distances independently at least 750 ft WNL gait pattern without complaints Baseline: Goal status: ongoing   PLAN: PT FREQUENCY: 1-3 times per week    PT DURATION: 6-8 weeks   PLANNED INTERVENTIONS (unless contraindicated): aquatic PT, Canalith repositioning, cryotherapy, Electrical stimulation, Iontophoresis with 4 mg/ml dexamethasome, Moist heat, traction, Ultrasound, gait training, Therapeutic exercise, balance training, neuromuscular re-education, patient/family education, prosthetic training, manual techniques, passive ROM, dry needling, taping, vasopnuematic device, vestibular, spinal manipulations, joint manipulations   PLAN FOR NEXT SESSION: knee extension ROM emphasis and quad strength to tolerance, progress gait to no AD as able.    April Manson, PT,DPT 09/10/2021, 11:29 AM

## 2021-09-11 NOTE — Telephone Encounter (Signed)
Called [facility] at [phone], spoke to Amy, regarding denial of fluticasone 220 mcg inhaler.  Was advised that the insurance will only cover name brand and there was a paid claim on 09/08/21.  Also was advised that afte 3 fills she will have to start using mail order to get her maintance  meds.  Offered to give her the number to set up mail order [phone]) but she stated she had their information. Dm/cma    Insurance information: ID # [id number] BIN# [id number] PCN# [id number] #  [id number]

## 2021-09-15 ENCOUNTER — Encounter: Payer: Self-pay | Admitting: Orthopedic Surgery

## 2021-09-15 ENCOUNTER — Ambulatory Visit (INDEPENDENT_AMBULATORY_CARE_PROVIDER_SITE_OTHER): Payer: PRIVATE HEALTH INSURANCE | Admitting: Orthopedic Surgery

## 2021-09-15 DIAGNOSIS — Z96651 Presence of right artificial knee joint: Secondary | ICD-10-CM

## 2021-09-15 NOTE — Progress Notes (Signed)
Office Visit Note   Patient: Jessica Huff           Date of Birth: 25-May-1960           MRN: [id number] Visit Date: 09/15/2021              Requested by: Loyola Mast, MD [address] [place],  [place] [address] PCP: Loyola Mast, MD  Chief Complaint  Patient presents with   Right Knee - Routine Post Op    06/25/2021 right total knee replacement       HPI: Patient is a 61 year old woman who is 3 months status post right total knee arthroplasty she is working with physical therapy 2 times a week ambulating with a cane she feels well.  Patient states that her initial return to work was August 14 however patient states she is unable to return to work with using her cane.  Patient does not feel safe ambulating without her cane at this time.  Assessment & Plan: Visit Diagnoses:  1. Status post total right knee replacement     Plan: Patient was given a note to return to work on September 1 without restrictions.  She will complete her physical therapy.  Follow-Up Instructions: Return if symptoms worsen or fail to improve.   Ortho Exam  Patient is alert, oriented, no adenopathy, well-dressed, normal affect, normal respiratory effort. Examination patient has excellent range of motion of the right knee from 0 to 120 degrees.  She has slight thickening of the scar and recommended scar massage daily with Shea butter.  There is no redness no cellulitis no signs of infection.  Imaging: No results found. No images are attached to the encounter.  Labs: Lab Results  Component Value Date   HGBA1C 5.9 07/18/2020     No results found for: "ALBUMIN", "PREALBUMIN", "CBC"  No results found for: "MG" No results found for: "VD25OH"  No results found for: "PREALBUMIN"    Latest Ref Rng & Units 06/10/2021   11:16 AM 02/07/2021    4:44 PM  CBC EXTENDED  WBC 4.0 - 10.5 K/uL 5.8  5.2   RBC 3.87 - 5.11 MIL/uL 4.77  4.80   Hemoglobin 12.0 - 15.0 g/dL 07.3  71.0   HCT 62.6  - 46.0 % 42.1  40.6   Platelets 150 - 400 K/uL 277  264      There is no height or weight on file to calculate BMI.  Orders:  No orders of the defined types were placed in this encounter.  No orders of the defined types were placed in this encounter.    Procedures: No procedures performed  Clinical Data: No additional findings.  ROS:  All other systems negative, except as noted in the HPI. Review of Systems  Objective: Vital Signs: There were no vitals taken for this visit.  Specialty Comments:  No specialty comments available.  PMFS History: Patient Active Problem List   Diagnosis Date Noted   Total knee replacement status, right 06/25/2021   Tear of lateral meniscus of right knee 03/19/2021   Unilateral primary osteoarthritis, right knee 03/19/2021   Acute pain of right knee 02/07/2021   Malaise and fatigue 02/07/2021   Prediabetes 10/19/2018   S/P lumbar fusion 09/16/2018   Asthma in adult, moderate persistent, uncomplicated 07/03/2016   Chronic pain of right ankle 03/02/2016   Allergic rhinitis 11/29/2015   Class 1 obesity due to excess calories with serious comorbidity and body mass index (BMI) of  32.0 to 32.9 in adult 11/29/2015   Benign positional vertigo 04/07/2015   Chronic bilateral low back pain with sciatica 04/07/2015   Common migraine 04/07/2015   Hyperlipidemia 04/07/2015   Essential hypertension 04/07/2015   Insomnia 04/07/2015   Obstructive sleep apnea 04/07/2015   Lumbar radiculopathy 04/07/2015   Anxiety 04/07/2015   Past Medical History:  Diagnosis Date   Asthma    GERD (gastroesophageal reflux disease)    Hyperlipidemia    Hypertension     Family History  Problem Relation Age of Onset   Diabetes Mother    Kidney disease Mother    Cancer Father        Kidney    Past Surgical History:  Procedure Laterality Date   ANKLE SURGERY Right 2018   LUMBAR FUSION  2020   L4-L5   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   Procedure:  RIGHT TOTAL KNEE ARTHROPLASTY;  Surgeon: Nadara Mustard, MD;  Location: [facility] OR;  Service: Orthopedics;  Laterality: Right;   Social History   Occupational History   Not on file  Tobacco Use   Smoking status: Never   Smokeless tobacco: Never  Vaping Use   Vaping Use: Never used  Substance and Sexual Activity   Alcohol use: Yes    Comment: socially   Drug use: Never   Sexual activity: Yes

## 2021-09-16 ENCOUNTER — Encounter: Payer: Self-pay | Admitting: Family Medicine

## 2021-09-16 ENCOUNTER — Ambulatory Visit (INDEPENDENT_AMBULATORY_CARE_PROVIDER_SITE_OTHER): Payer: PRIVATE HEALTH INSURANCE | Admitting: Family Medicine

## 2021-09-16 VITALS — BP 130/74 | HR 81 | Temp 97.0°F | Ht 72.0 in | Wt 238.8 lb

## 2021-09-16 DIAGNOSIS — J4541 Moderate persistent asthma with (acute) exacerbation: Secondary | ICD-10-CM

## 2021-09-16 NOTE — Progress Notes (Signed)
[facility] [address] [place] [address] Dept: [phone] Dept Fax: [phone]  Office Visit  Subjective:    Patient ID: Jessica Huff, female    DOB: 09-02-1960, 61 y.o..   MRN: [id number]  Chief Complaint  Patient presents with   Follow-up    2 week f/u. No concerns.      History of Present Illness:  Patient is in today of her asthma. I had seen Jessica Huff on 7/7 with a recent flare of her asthma symptoms. She noted she was needing to use her albuterol inhaler multiple times a day. I prescribes a course of oral prednisone and switched her inhaler to  the 220 mcg/inh strength. There was some difficulty with her insurance in getting the new strength approved. She notes that her breathing is much improved at this point.  Past Medical History: Patient Active Problem List   Diagnosis Date Noted   Total knee replacement status, right 06/25/2021   Tear of lateral meniscus of right knee 03/19/2021   Unilateral primary osteoarthritis, right knee 03/19/2021   Acute pain of right knee 02/07/2021   Malaise and fatigue 02/07/2021   Prediabetes 10/19/2018   S/P lumbar fusion 09/16/2018   Asthma in adult, moderate persistent, uncomplicated 07/03/2016   Chronic pain of right ankle 03/02/2016   Allergic rhinitis 11/29/2015   Class 1 obesity due to excess calories with serious comorbidity and body mass index (BMI) of 32.0 to 32.9 in adult 11/29/2015   Benign positional vertigo 04/07/2015   Chronic bilateral low back pain with sciatica 04/07/2015   Common migraine 04/07/2015   Hyperlipidemia 04/07/2015   Essential hypertension 04/07/2015   Insomnia 04/07/2015   Obstructive sleep apnea 04/07/2015   Lumbar radiculopathy 04/07/2015   Anxiety 04/07/2015   Past Surgical History:  Procedure Laterality Date   ANKLE SURGERY Right 2018   LUMBAR FUSION  2020   L4-L5   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   Procedure: RIGHT  TOTAL KNEE ARTHROPLASTY;  Surgeon: Nadara Mustard, MD;  Location: [facility] OR;  Service: Orthopedics;  Laterality: Right;   Family History  Problem Relation Age of Onset   Diabetes Mother    Kidney disease Mother    Cancer Father        Kidney   Outpatient Medications Prior to Visit  Medication Sig Dispense Refill   albuterol (VENTOLIN HFA) 108 (90 Base) MCG/ACT inhaler INHALE 2 PUFFS INTO THE LUNGS EVERY 6 HOURS AS NEEDED 6.7 g 6   amitriptyline (ELAVIL) 25 MG tablet Take 1 tablet (25 mg total) by mouth at bedtime. 30 tablet 4   ezetimibe (ZETIA) 10 MG tablet TAKE 1 TABLET(10 MG) BY MOUTH DAILY 90 tablet 3   fluticasone (FLONASE) 50 MCG/ACT nasal spray SHAKE LIQUID AND USE 2 SPRAYS IN EACH NOSTRIL DAILY (Patient taking differently: Place 2 sprays into both nostrils daily as needed for allergies or rhinitis.) 16 g 6   fluticasone (FLOVENT HFA) 220 MCG/ACT inhaler Inhale 1 puff into the lungs in the morning and at bedtime. 1 each 12   levocetirizine (XYZAL) 5 MG tablet Take 5 mg by mouth daily as needed for allergies.     losartan-hydrochlorothiazide (HYZAAR) 100-25 MG tablet TAKE 1 TABLET BY MOUTH DAILY 90 tablet 3   montelukast (SINGULAIR) 10 MG tablet Take 1 tablet (10 mg total) by mouth at bedtime. 30 tablet 1   Multiple Vitamin (MULTI-VITAMIN) tablet Take 1 tablet by mouth daily.     oxyCODONE-acetaminophen (PERCOCET/ROXICET)  5-325 MG tablet Take 1 tablet by mouth every 8 (eight) hours as needed. 30 tablet 0   pregabalin (LYRICA) 100 MG capsule Take 1 capsule (100 mg total) by mouth daily. Day or night for 1 week, then increase to 100 mg BID- for nerve pain 60 capsule 5   rosuvastatin (CRESTOR) 40 MG tablet TAKE 1 TABLET(40 MG) BY MOUTH DAILY 90 tablet 3   predniSONE (DELTASONE) 20 MG tablet Take 2 tablets (40 mg total) by mouth daily with breakfast. 20 tablet 0   No facility-administered medications prior to visit.   No Known Allergies    Objective:   Today's Vitals   09/16/21 1336   BP: 130/74  Pulse: 81  Temp: (!) 97 F (36.1 C)  TempSrc: Temporal  SpO2: 96%  Weight: 238 lb 12.8 oz (108.3 kg)  Height: 6' (1.829 m)   Body mass index is 32.39 kg/m.   General: Well developed, well nourished. No acute distress. Lungs: Clear to auscultation bilaterally. No wheezing, rales or rhonchi. Psych: Alert and oriented. Normal mood and affect.  Health Maintenance Due  Topic Date Due   HIV Screening  Never done   Hepatitis C Screening  Never done     Assessment & Plan:   1. Moderate persistent asthma with exacerbation Improved. We will continue on Flovent 220 mcg/inh one puff twice a day and albuterol 2 puffs q 6 hours as needed. I will plan to see her back in Oct for her flu shot and reassessment.   Return in about 3 months (around 12/17/2021) for Reassessment.   Loyola Mast, MD

## 2021-09-22 ENCOUNTER — Ambulatory Visit (INDEPENDENT_AMBULATORY_CARE_PROVIDER_SITE_OTHER): Payer: PRIVATE HEALTH INSURANCE | Admitting: Physical Therapy

## 2021-09-22 ENCOUNTER — Encounter: Payer: Self-pay | Admitting: Physical Therapy

## 2021-09-22 DIAGNOSIS — R600 Localized edema: Secondary | ICD-10-CM | POA: Diagnosis not present

## 2021-09-22 DIAGNOSIS — M25561 Pain in right knee: Secondary | ICD-10-CM | POA: Diagnosis not present

## 2021-09-22 DIAGNOSIS — M6281 Muscle weakness (generalized): Secondary | ICD-10-CM

## 2021-09-22 DIAGNOSIS — R262 Difficulty in walking, not elsewhere classified: Secondary | ICD-10-CM | POA: Diagnosis not present

## 2021-09-22 NOTE — Therapy (Signed)
OUTPATIENT PHYSICAL THERAPY TREATMENT NOTE/Rcert   Patient Name: Jessica Huff MRN: [id number] DOB:15-Aug-1960, 61 y.o., female Today's Date: 7/31/2023a  END OF SESSION:   PT End of Session - 09/22/21 1111     Visit Number 13    Number of Visits 20    Date for PT Re-Evaluation 10/20/21    PT Start Time 1100    PT Stop Time 1148    PT Time Calculation (min) 48 min    Activity Tolerance Patient tolerated treatment well    Behavior During Therapy [facility] for tasks assessed/performed              Past Medical History:  Diagnosis Date   Asthma    GERD (gastroesophageal reflux disease)    Hyperlipidemia    Hypertension    Past Surgical History:  Procedure Laterality Date   ANKLE SURGERY Right 2018   LUMBAR FUSION  2020   L4-L5   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   Procedure: RIGHT TOTAL KNEE ARTHROPLASTY;  Surgeon: Newt Minion, MD;  Location: [facility];  Service: Orthopedics;  Laterality: Right;   Patient Active Problem List   Diagnosis Date Noted   Total knee replacement status, right 06/25/2021   Tear of lateral meniscus of right knee 03/19/2021   Unilateral primary osteoarthritis, right knee 03/19/2021   Acute pain of right knee 02/07/2021   Malaise and fatigue 02/07/2021   Prediabetes 10/19/2018   S/P lumbar fusion 09/16/2018   Asthma in adult, moderate persistent, uncomplicated 73/42/8768   Chronic pain of right ankle 03/02/2016   Allergic rhinitis 11/29/2015   Class 1 obesity due to excess calories with serious comorbidity and body mass index (BMI) of 32.0 to 32.9 in adult 11/29/2015   Benign positional vertigo 04/07/2015   Chronic bilateral low back pain with sciatica 04/07/2015   Common migraine 04/07/2015   Hyperlipidemia 04/07/2015   Essential hypertension 04/07/2015   Insomnia 04/07/2015   Obstructive sleep apnea 04/07/2015   Lumbar radiculopathy 04/07/2015   Anxiety 04/07/2015     THERAPY DIAG:  Acute pain of right knee  Difficulty in  walking, not elsewhere classified  Muscle weakness (generalized)  Localized edema    PCP: Haydee Salter, MD   REFERRING PROVIDER: Suzan Slick, NP   REFERRING DIAG: [phone] (ICD-10-CM) - Status post total right knee replacement    Rationale for Evaluation and Treatment Rehabilitation   ONSET DATE: Rt TKA 06/25/21   SUBJECTIVE:    SUBJECTIVE STATEMENT: She relays MD has planned to keep her out of work until first of September. She has concerns about returning to standing work for 8 hours a day.  PERTINENT HISTORY:Rt TKA, lumbar fusion,asthma, chronic pain,vertigo, sciatica,insomnia,sleep apnea     PAIN:  Are you having pain? Yes: 1/10 upon arrival Pain location: medial and posterior Rt knee Pain description: sharp and throbbing Aggravating factors: straightening her knee, steps Relieving factors:  ice   PRECAUTIONS: None   WEIGHT BEARING RESTRICTIONS No   FALLS:  Has patient fallen in last 6 months? No   LIVING ENVIRONMENT: Around 7 steps inside to bedroom   OCCUPATION: works for [name] involving standing, walking, bending, lifting   PLOF: Independent   PATIENT GOALS : walk better     OBJECTIVE:    DIAGNOSTIC FINDINGS:    PATIENT SURVEYS:  07/24/21 FOTO 23% functional intake   COGNITION:           Overall cognitive status: Within functional limits for tasks assessed  SENSATION:     EDEMA:  07/24/21 Moderate edema in Rt knee, no other signs of infection noted   MUSCLE LENGTH: Tightness noted in quads and hamsrings on Rt   POSTURE:    PALPATION:     LOWER EXTREMITY ROM:    AROM/PROM Right eval Right  6/01/25/22 Right 08/13/21 Right 09/01/21 Right 09/22/21  Hip flexion         Hip extension         Hip abduction         Hip adduction         Hip internal rotation         Hip external rotation         Knee flexion 110/115  115/120 /120 122/ 122/  Knee extension 8/5  6/4 5/3 3/2 2/  Ankle  dorsiflexion         Ankle plantarflexion         Ankle inversion         Ankle eversion          (Blank rows = not tested)   LOWER EXTREMITY MMT:   MMT in sitting Right eval Left eval Right 08/25/21 Right 09/03/21 Right 09/22/21  Hip flexion 3+       Hip extension         Hip abduction 4       Hip adduction         Hip internal rotation         Hip external rotation         Knee flexion _0 4+  Knee extension 3+ 5 4 4+ 4+  Ankle dorsiflexion         Ankle plantarflexion         Ankle inversion         Ankle eversion          (Blank rows = not tested)   LOWER EXTREMITY SPECIAL TESTS:      FUNCTIONAL TESTS:      GAIT: 07/24/21 Distance walked: ambulates in with RW that was too short and was properly adjusted for her. She was then able to progress to [facility] 25 feet X 2 with cues and demo for technique. Close supervision needed. 07/28/21 Arrives with new front wheel RW that was also too short for her so this was adjusted to proper height and tennis balls added. Then Gait with SPC 150 feet with supervision and cues/demo for technique 07/30/21 Gait with SPC 150 feet with supervision and cues/demo for step through pattern vs her preferred step to pattern 08/06/21 Gait with SPC 150 feet with supervision and did not need cues for technique and step through pattern 08/13/21 Gait no AD 100 feet with supervision 08/25/21 Gait no AD 100 feet X2 with supervision 08/27/21 Gait no AD 100 feet X2 with supervision 09/01/21 08/27/21 Gait no AD 125 feet X2 with supervision + dynamic balance activities listed below       TODAY'S TREATMENT: 09/22/21 -Nu step L6 X 8 min -gastroc stretch 30 sec X 3 bilat -Leg press DL 125# 12 X 2, then Rt leg only 56# 2X10 -march walking 3 round trips in bars -tandem walk 3 round trips in bars -leg extension machine  Rt only 5# 2X10, then eccentrics (up with with both and down with Rt only) 10# 2X10 -Step ups 6 inch fwd and lateral X10, with one UE support -Rt  hamstring stretch seated 30 sec X 3 -Sit to stands  with UE support to push up then slow eccentrics no hands to sit down X10 -Rt heel prop for knee extension stretch 5 min during first part of vaso  Modalities: Vasopnuematic to Rt knee X 10 min, medium compression 34 deg  09/10/21 -recumbent bike  L3X 10 min -Gait without AD throughout session  -Leg press DL 118# 15 X 2, then Rt leg only 50# 2X15 -march walking 3 round trips in bars -tandem walk 3 round trips in bars -Retro walk 3 round trips in bars -leg extension machine  Rt only 5# 2X10, then eccentrics (up with with both and down with Rt only) 10# 2X10 -hamstring curl machine DL 25# 2X15 -Rt hamstring stretch seated 30 sec X 3 -Sit to stands with UE support to push up then slow eccentrics no hands to sit down X10 -Rt heel prop for knee extension stretch 5 min during first part of vaso  Modalities: Vasopnuematic to Rt knee X 10 min, medium compression 34 deg      PATIENT EDUCATION:  Education details: HEP, PT plan of care Person educated: Patient Education method: Explanation, Demonstration, Verbal cues, and Handouts Education comprehension: verbalized understanding, returned demonstration, and needs further education     HOME EXERCISE PROGRAM: Access Code: [email or web address] URL: [email or web address] Date: 07/24/2021 Prepared by: Elsie Ra   Exercises - Quad Setting and Stretching  - 2 x daily - 6 x weekly - 2 sets - 10 reps - Seated Knee Flexion Stretch  - 2 x daily - 6 x weekly - 1-2 sets - 10 reps - 5 sec hold - Seated Hamstring Stretch  - 2 x daily - 6 x weekly - 1 sets - 3 reps - 30 hold - Seated Straight Leg Heel Taps  - 2 x daily - 6 x weekly - 3 sets - 5 reps - Mini Squat with Counter Support  - 2 x daily - 6 x weekly - 1-2 sets - 10 reps     ASSESSMENT:   CLINICAL IMPRESSION: Recert today as PT plan of care date had expired. She has made good progress with PT but does still lack mild knee extension  ROM, as well as functional strength/endurance needed to return to normal work duty. She arrives for first time without AD for ambulation. PT recommending 4 more weeks of PT to improve her function with standing activity and ambulation without AD needed to return to work.  OBJECTIVE IMPAIRMENTS: decreased activity tolerance, difficulty walking, decreased balance, decreased endurance, decreased mobility, decreased ROM, decreased strength, impaired flexibility, impaired LE use, postural dysfunction, and pain.   ACTIVITY LIMITATIONS: bending, lifting, carry, locomotion, cleaning, community activity, driving, and or occupation   PERSONAL FACTORS:  Rt TKA, lumbar fusion,asthma, chronic pain,vertigo, sciatica,insomnia,sleep apnea are also affecting patient's functional outcome.   REHAB POTENTIAL: Good   CLINICAL DECISION MAKING: Stable/uncomplicated   EVALUATION COMPLEXITY: Low       GOALS: Short term PT Goals Target date: 08/21/2021 Pt will be I and compliant with HEP. Baseline:  Goal status: MET Pt will improve gait from RW to [facility] no more than supervision for 300 feet Baseline: Goal status:MET   Long term PT goals Target date: 09/18/2021 Pt will improve Rt knee PROM 0-120 to improve functional mobility Baseline: Goal status: ongoing, 4-120 on 08/3121 Pt will improve  Rt hip/knee strength to at least 5-/5 MMT to improve functional strength Baseline: Goal status: ongoing Pt will improve FOTO to at least 49% functional to show improved function Baseline: Goal status: ongoing  Pt will reduce pain to overall less than 2-3/10 with usual activity and work activity. Baseline: Goal status: 7/31/23ongoing has not yet returned to work but pain 1-3 with usual ADLS Pt will be able to ambulate community distances independently at least 750 ft WNL gait pattern without complaints Baseline: Goal status: ongoing, can now make it about 300 feet 09/22/21   PLAN: PT FREQUENCY: 1-3 times per week    PT  DURATION: 6-8 weeks   PLANNED INTERVENTIONS (unless contraindicated): aquatic PT, Canalith repositioning, cryotherapy, Electrical stimulation, Iontophoresis with 4 mg/ml dexamethasome, Moist heat, traction, Ultrasound, gait training, Therapeutic exercise, balance training, neuromuscular re-education, patient/family education, prosthetic training, manual techniques, passive ROM, dry needling, taping, vasopnuematic device, vestibular, spinal manipulations, joint manipulations   PLAN FOR NEXT SESSION: knee extension ROM emphasis and functional strength to tolerance   Debbe Odea, PT,DPT 09/22/2021, 11:12 AM

## 2021-09-24 ENCOUNTER — Ambulatory Visit (INDEPENDENT_AMBULATORY_CARE_PROVIDER_SITE_OTHER): Payer: PRIVATE HEALTH INSURANCE | Admitting: Physical Therapy

## 2021-09-24 ENCOUNTER — Encounter: Payer: Self-pay | Admitting: Physical Therapy

## 2021-09-24 DIAGNOSIS — R262 Difficulty in walking, not elsewhere classified: Secondary | ICD-10-CM | POA: Diagnosis not present

## 2021-09-24 DIAGNOSIS — M6281 Muscle weakness (generalized): Secondary | ICD-10-CM

## 2021-09-24 DIAGNOSIS — M25561 Pain in right knee: Secondary | ICD-10-CM | POA: Diagnosis not present

## 2021-09-24 DIAGNOSIS — R600 Localized edema: Secondary | ICD-10-CM

## 2021-09-24 NOTE — Therapy (Signed)
OUTPATIENT PHYSICAL THERAPY TREATMENT NOTE   Patient Name: Jessica Huff MRN: [id number] DOB:[birth year], 61 y.o., female Today's Date: 8/2/2023a  END OF SESSION:   PT End of Session - 09/24/21 1122     Visit Number 14    Number of Visits 20    Date for PT Re-Evaluation 10/20/21    PT Start Time 1100    PT Stop Time 1148    PT Time Calculation (min) 48 min    Activity Tolerance Patient tolerated treatment well    Behavior During Therapy [employer] for tasks assessed/performed              Past Medical History:  Diagnosis Date   Asthma    GERD (gastroesophageal reflux disease)    Hyperlipidemia    Hypertension    Past Surgical History:  Procedure Laterality Date   ANKLE SURGERY Right 2018   LUMBAR FUSION  2020   L4-L5   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   Procedure: RIGHT TOTAL KNEE ARTHROPLASTY;  Surgeon: Newt Minion, MD;  Location: [place];  Service: Orthopedics;  Laterality: Right;   Patient Active Problem List   Diagnosis Date Noted   Total knee replacement status, right 06/25/2021   Tear of lateral meniscus of right knee 03/19/2021   Unilateral primary osteoarthritis, right knee 03/19/2021   Acute pain of right knee 02/07/2021   Malaise and fatigue 02/07/2021   Prediabetes 10/19/2018   S/P lumbar fusion 09/16/2018   Asthma in adult, moderate persistent, uncomplicated 03/03/3233   Chronic pain of right ankle 03/02/2016   Allergic rhinitis 11/29/2015   Class 1 obesity due to excess calories with serious comorbidity and body mass index (BMI) of 32.0 to 32.9 in adult 11/29/2015   Benign positional vertigo 04/07/2015   Chronic bilateral low back pain with sciatica 04/07/2015   Common migraine 04/07/2015   Hyperlipidemia 04/07/2015   Essential hypertension 04/07/2015   Insomnia 04/07/2015   Obstructive sleep apnea 04/07/2015   Lumbar radiculopathy 04/07/2015   Anxiety 04/07/2015     THERAPY DIAG:  Acute pain of right knee  Difficulty in walking, not  elsewhere classified  Muscle weakness (generalized)  Localized edema    PCP: Haydee Salter, MD   REFERRING PROVIDER: Suzan Slick, NP   REFERRING DIAG: [phone] (ICD-10-CM) - Status post total right knee replacement    Rationale for Evaluation and Treatment Rehabilitation   ONSET DATE: Rt TKA 06/25/21   SUBJECTIVE:    SUBJECTIVE STATEMENT: She relays MD has planned to keep her out of work until first of September. She has concerns about returning to standing work for 8 hours a day.  PERTINENT HISTORY:Rt TKA, lumbar fusion,asthma, chronic pain,vertigo, sciatica,insomnia,sleep apnea     PAIN:  Are you having pain? Yes: 1/10 upon arrival Pain location: medial and posterior Rt knee Pain description: sharp and throbbing Aggravating factors: straightening her knee, steps Relieving factors:  ice   PRECAUTIONS: None   WEIGHT BEARING RESTRICTIONS No   FALLS:  Has patient fallen in last 6 months? No   LIVING ENVIRONMENT: Around 7 steps inside to bedroom   OCCUPATION: works for [name] involving standing, walking, bending, lifting   PLOF: Independent   PATIENT GOALS : walk better     OBJECTIVE:    DIAGNOSTIC FINDINGS:    PATIENT SURVEYS:  07/24/21 FOTO 23% functional intake   COGNITION:           Overall cognitive status: Within functional limits for tasks assessed  SENSATION:     EDEMA:  07/24/21 Moderate edema in Rt knee, no other signs of infection noted   MUSCLE LENGTH: Tightness noted in quads and hamsrings on Rt   POSTURE:    PALPATION:     LOWER EXTREMITY ROM:    AROM/PROM Right eval Right  6/01/25/22 Right 08/13/21 Right 09/01/21 Right 09/22/21  Hip flexion         Hip extension         Hip abduction         Hip adduction         Hip internal rotation         Hip external rotation         Knee flexion 110/115  115/120 /120 122/ 122/  Knee extension 8/5  6/4 5/3 3/2 2/  Ankle dorsiflexion          Ankle plantarflexion         Ankle inversion         Ankle eversion          (Blank rows = not tested)   LOWER EXTREMITY MMT:   MMT in sitting Right eval Left eval Right 08/25/21 Right 09/03/21 Right 09/22/21  Hip flexion 3+       Hip extension         Hip abduction 4       Hip adduction         Hip internal rotation         Hip external rotation         Knee flexion '4 5 4 4 ' 4+  Knee extension 3+ 5 4 4+ 4+  Ankle dorsiflexion         Ankle plantarflexion         Ankle inversion         Ankle eversion          (Blank rows = not tested)   LOWER EXTREMITY SPECIAL TESTS:      FUNCTIONAL TESTS:      GAIT: 07/24/21 Distance walked: ambulates in with RW that was too short and was properly adjusted for her. She was then able to progress to [facility] 25 feet X 2 with cues and demo for technique. Close supervision needed. 07/28/21 Arrives with new front wheel RW that was also too short for her so this was adjusted to proper height and tennis balls added. Then Gait with SPC 150 feet with supervision and cues/demo for technique 07/30/21 Gait with SPC 150 feet with supervision and cues/demo for step through pattern vs her preferred step to pattern 08/06/21 Gait with SPC 150 feet with supervision and did not need cues for technique and step through pattern 08/13/21 Gait no AD 100 feet with supervision 08/25/21 Gait no AD 100 feet X2 with supervision 08/27/21 Gait no AD 100 feet X2 with supervision 09/01/21 08/27/21 Gait no AD 125 feet X2 with supervision + dynamic balance activities listed below       TODAY'S TREATMENT: 09/24/21 -Nu step L6 X 8 min -gastroc stretch 30 sec X 3 bilat -Step up with Rt and down in front with left X10 on 6 inch step with 1 UE support -Lateral step up and over 6 inch step X 10 bilat with 2UE support -Leg press DL 125# 12 X 2, then Rt leg only 56# 2X10 -leg extension machine  Rt only 5# 2X10, then eccentrics (up with with both and down with Rt only) 10# 2X10 -Rt  hamstring stretch seated  30 sec X 3 -Sit to stands without UE support from raised mat X10  Modalities: Vasopnuematic to Rt knee X 10 min, medium compression 34 deg  09/22/21 -Nu step L6 X 8 min -gastroc stretch 30 sec X 3 bilat -Leg press DL 125# 12 X 2, then Rt leg only 56# 2X10 -march walking 3 round trips in bars -tandem walk 3 round trips in bars -leg extension machine  Rt only 5# 2X10, then eccentrics (up with with both and down with Rt only) 10# 2X10 -Step ups 6 inch fwd and lateral X10, with one UE support -Rt hamstring stretch seated 30 sec X 3 -Sit to stands with UE support to push up then slow eccentrics no hands to sit down X10 -Rt heel prop for knee extension stretch 5 min during first part of vaso  Modalities: Vasopnuematic to Rt knee X 10 min, medium compression 34 deg     PATIENT EDUCATION:  Education details: HEP, PT plan of care Person educated: Patient Education method: Explanation, Demonstration, Verbal cues, and Handouts Education comprehension: verbalized understanding, returned demonstration, and needs further education     HOME EXERCISE PROGRAM: Access Code: [email or web address] URL: [email or web address] Date: 07/24/2021 Prepared by: Elsie Ra   Exercises - Quad Setting and Stretching  - 2 x daily - 6 x weekly - 2 sets - 10 reps - Seated Knee Flexion Stretch  - 2 x daily - 6 x weekly - 1-2 sets - 10 reps - 5 sec hold - Seated Hamstring Stretch  - 2 x daily - 6 x weekly - 1 sets - 3 reps - 30 hold - Seated Straight Leg Heel Taps  - 2 x daily - 6 x weekly - 3 sets - 5 reps - Mini Squat with Counter Support  - 2 x daily - 6 x weekly - 1-2 sets - 10 reps     ASSESSMENT:   CLINICAL IMPRESSION: Knee extension ROM looked better today with visual assessment. We continue to work to improve her functional strength as tolerated to maximize function.   OBJECTIVE IMPAIRMENTS: decreased activity tolerance, difficulty walking, decreased balance, decreased  endurance, decreased mobility, decreased ROM, decreased strength, impaired flexibility, impaired LE use, postural dysfunction, and pain.   ACTIVITY LIMITATIONS: bending, lifting, carry, locomotion, cleaning, community activity, driving, and or occupation   PERSONAL FACTORS:  Rt TKA, lumbar fusion,asthma, chronic pain,vertigo, sciatica,insomnia,sleep apnea are also affecting patient's functional outcome.   REHAB POTENTIAL: Good   CLINICAL DECISION MAKING: Stable/uncomplicated   EVALUATION COMPLEXITY: Low       GOALS: Short term PT Goals Target date: 08/21/2021 Pt will be I and compliant with HEP. Baseline:  Goal status: MET Pt will improve gait from RW to [facility] no more than supervision for 300 feet Baseline: Goal status:MET   Long term PT goals Target date: 09/18/2021 Pt will improve Rt knee PROM 0-120 to improve functional mobility Baseline: Goal status: ongoing, 4-120 on 08/3121 Pt will improve  Rt hip/knee strength to at least 5-/5 MMT to improve functional strength Baseline: Goal status: ongoing Pt will improve FOTO to at least 49% functional to show improved function Baseline: Goal status: ongoing Pt will reduce pain to overall less than 2-3/10 with usual activity and work activity. Baseline: Goal status: 7/31/23ongoing has not yet returned to work but pain 1-3 with usual ADLS Pt will be able to ambulate community distances independently at least 750 ft WNL gait pattern without complaints Baseline: Goal status: ongoing, can now make it about 300 feet 09/22/21  PLAN: PT FREQUENCY: 1-3 times per week    PT DURATION: 6-8 weeks   PLANNED INTERVENTIONS (unless contraindicated): aquatic PT, Canalith repositioning, cryotherapy, Electrical stimulation, Iontophoresis with 4 mg/ml dexamethasome, Moist heat, traction, Ultrasound, gait training, Therapeutic exercise, balance training, neuromuscular re-education, patient/family education, prosthetic training, manual techniques, passive  ROM, dry needling, taping, vasopnuematic device, vestibular, spinal manipulations, joint manipulations   PLAN FOR NEXT SESSION: functional strength to tolerance, vaso if desired.   Debbe Odea, PT,DPT 09/24/2021, 11:23 AM

## 2021-09-29 ENCOUNTER — Encounter: Payer: Self-pay | Admitting: Physical Therapy

## 2021-09-29 ENCOUNTER — Ambulatory Visit (INDEPENDENT_AMBULATORY_CARE_PROVIDER_SITE_OTHER): Payer: PRIVATE HEALTH INSURANCE | Admitting: Physical Therapy

## 2021-09-29 DIAGNOSIS — R262 Difficulty in walking, not elsewhere classified: Secondary | ICD-10-CM | POA: Diagnosis not present

## 2021-09-29 DIAGNOSIS — M6281 Muscle weakness (generalized): Secondary | ICD-10-CM | POA: Diagnosis not present

## 2021-09-29 DIAGNOSIS — R600 Localized edema: Secondary | ICD-10-CM | POA: Diagnosis not present

## 2021-09-29 DIAGNOSIS — M25561 Pain in right knee: Secondary | ICD-10-CM

## 2021-09-29 NOTE — Therapy (Signed)
OUTPATIENT PHYSICAL THERAPY TREATMENT NOTE   Patient Name: Jessica Huff MRN: [id number] DOB:February 26, 1960, 61 y.o., female Today's Date: 8/7/2023a  END OF SESSION:   PT End of Session - 09/29/21 1057     Visit Number 15    Number of Visits 20    Date for PT Re-Evaluation 10/20/21    PT Start Time 1100    PT Stop Time 1148    PT Time Calculation (min) 48 min    Activity Tolerance Patient tolerated treatment well    Behavior During Therapy WFL for tasks assessed/performed              Past Medical History:  Diagnosis Date   Asthma    GERD (gastroesophageal reflux disease)    Hyperlipidemia    Hypertension    Past Surgical History:  Procedure Laterality Date   ANKLE SURGERY Right 2018   LUMBAR FUSION  2020   L4-L5   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   Procedure: RIGHT TOTAL KNEE ARTHROPLASTY;  Surgeon: Newt Minion, MD;  Location: [place];  Service: Orthopedics;  Laterality: Right;   Patient Active Problem List   Diagnosis Date Noted   Total knee replacement status, right 06/25/2021   Tear of lateral meniscus of right knee 03/19/2021   Unilateral primary osteoarthritis, right knee 03/19/2021   Acute pain of right knee 02/07/2021   Malaise and fatigue 02/07/2021   Prediabetes 10/19/2018   S/P lumbar fusion 09/16/2018   Asthma in adult, moderate persistent, uncomplicated 29/47/6546   Chronic pain of right ankle 03/02/2016   Allergic rhinitis 11/29/2015   Class 1 obesity due to excess calories with serious comorbidity and body mass index (BMI) of 32.0 to 32.9 in adult 11/29/2015   Benign positional vertigo 04/07/2015   Chronic bilateral low back pain with sciatica 04/07/2015   Common migraine 04/07/2015   Hyperlipidemia 04/07/2015   Essential hypertension 04/07/2015   Insomnia 04/07/2015   Obstructive sleep apnea 04/07/2015   Lumbar radiculopathy 04/07/2015   Anxiety 04/07/2015     THERAPY DIAG:  Acute pain of right knee  Difficulty in walking, not  elsewhere classified  Muscle weakness (generalized)  Localized edema    PCP: Haydee Salter, MD   REFERRING PROVIDER: Suzan Slick, NP   REFERRING DIAG: [phone] (ICD-10-CM) - Status post total right knee replacement    Rationale for Evaluation and Treatment Rehabilitation   ONSET DATE: Rt TKA 06/25/21   SUBJECTIVE:    SUBJECTIVE STATEMENT: She reports she is not using cane anymore  PERTINENT HISTORY:Rt TKA, lumbar fusion,asthma, chronic pain,vertigo, sciatica,insomnia,sleep apnea     PAIN:  Are you having pain? Yes: 1/10 upon arrival Pain location: medial and posterior Rt knee Pain description: sharp and throbbing Aggravating factors: straightening her knee, steps Relieving factors:  ice   PRECAUTIONS: None   WEIGHT BEARING RESTRICTIONS No   FALLS:  Has patient fallen in last 6 months? No   LIVING ENVIRONMENT: Around 7 steps inside to bedroom   OCCUPATION: works for [name] involving standing, walking, bending, lifting   PLOF: Independent   PATIENT GOALS : walk better     OBJECTIVE:    DIAGNOSTIC FINDINGS:    PATIENT SURVEYS:  07/24/21 FOTO 23% functional intake   COGNITION:           Overall cognitive status: Within functional limits for tasks assessed  SENSATION:     EDEMA:  07/24/21 Moderate edema in Rt knee, no other signs of infection noted   MUSCLE LENGTH: Tightness noted in quads and hamsrings on Rt   POSTURE:    PALPATION:     LOWER EXTREMITY ROM:    AROM/PROM Right eval Right  6/01/25/22 Right 08/13/21 Right 09/01/21 Right 09/22/21  Hip flexion         Hip extension         Hip abduction         Hip adduction         Hip internal rotation         Hip external rotation         Knee flexion 110/115  115/120 /120 122/ 122/  Knee extension 8/5  6/4 5/3 3/2 2/  Ankle dorsiflexion         Ankle plantarflexion         Ankle inversion         Ankle eversion          (Blank rows = not  tested)   LOWER EXTREMITY MMT:   MMT in sitting Right eval Left eval Right 08/25/21 Right 09/03/21 Right 09/22/21  Hip flexion 3+       Hip extension         Hip abduction 4       Hip adduction         Hip internal rotation         Hip external rotation         Knee flexion '4 5 4 4 ' 4+  Knee extension 3+ 5 4 4+ 4+  Ankle dorsiflexion         Ankle plantarflexion         Ankle inversion         Ankle eversion          (Blank rows = not tested)   LOWER EXTREMITY SPECIAL TESTS:      FUNCTIONAL TESTS:      GAIT: 07/24/21 Distance walked: ambulates in with RW that was too short and was properly adjusted for her. She was then able to progress to [facility] 25 feet X 2 with cues and demo for technique. Close supervision needed. 07/28/21 Arrives with new front wheel RW that was also too short for her so this was adjusted to proper height and tennis balls added. Then Gait with SPC 150 feet with supervision and cues/demo for technique 07/30/21 Gait with SPC 150 feet with supervision and cues/demo for step through pattern vs her preferred step to pattern 08/06/21 Gait with SPC 150 feet with supervision and did not need cues for technique and step through pattern 08/13/21 Gait no AD 100 feet with supervision 08/25/21 Gait no AD 100 feet X2 with supervision 08/27/21 Gait no AD 100 feet X2 with supervision 09/01/21 08/27/21 Gait no AD 125 feet X2 with supervision + dynamic balance activities listed below       TODAY'S TREATMENT: 09/29/21 -recumbent bike L3 X 8 min -gastroc stretch 30 sec X 3 bilat -Step up with Rt and down in front with left X10 on 6 inch step with 1 UE support -stairs in clinic reciprocally down with one handrail, then to come back up has more weakness/difficulty and must do one step at a time but leading with Rt.  -Leg press DL 125# 12 X 15, then Rt leg only 56# X10, then 50# 2X10 -leg extension machine  10# 3X10 eccentrics (up  with with both and down with Rt only), then Rt leg only  5# -Hamstring curl machine 35# DL 2X15 -Rt hamstring stretch seated 30 sec X 3 -Sit to stands without UE support from raised mat X10 -March walking and tandem walking 3 round trips at counter top  -Modalities: Vasopnuematic to Rt knee X 10 min, medium compression 34 deg  09/24/21 -Nu step L6 X 8 min -gastroc stretch 30 sec X 3 bilat -Step up with Rt and down in front with left X10 on 6 inch step with 1 UE support -Lateral step up and over 6 inch step X 10 bilat with 2UE support -Leg press DL 125# 12 X 2, then Rt leg only 56# 2X10 -leg extension machine  Rt only 5# 2X10, then eccentrics (up with with both and down with Rt only) 10# 2X10 -Rt hamstring stretch seated 30 sec X 3 -Sit to stands without UE support from raised mat X10  Modalities: Vasopnuematic to Rt knee X 10 min, medium compression 34 deg  09/22/21 -Nu step L6 X 8 min -gastroc stretch 30 sec X 3 bilat -Leg press DL 125# 12 X 2, then Rt leg only 56# 2X10 -march walking 3 round trips in bars -tandem walk 3 round trips in bars -leg extension machine  Rt only 5# 2X10, then eccentrics (up with with both and down with Rt only) 10# 2X10 -Step ups 6 inch fwd and lateral X10, with one UE support -Rt hamstring stretch seated 30 sec X 3 -Sit to stands with UE support to push up then slow eccentrics no hands to sit down X10 -Rt heel prop for knee extension stretch 5 min during first part of vaso  Modalities: Vasopnuematic to Rt knee X 10 min, medium compression 34 deg     PATIENT EDUCATION:  Education details: HEP, PT plan of care Person educated: Patient Education method: Explanation, Demonstration, Verbal cues, and Handouts Education comprehension: verbalized understanding, returned demonstration, and needs further education     HOME EXERCISE PROGRAM: Access Code: [email or web address] URL: [email or web address] Date: 07/24/2021 Prepared by: Elsie Ra   Exercises - Quad Setting and Stretching  - 2 x daily - 6 x  weekly - 2 sets - 10 reps - Seated Knee Flexion Stretch  - 2 x daily - 6 x weekly - 1-2 sets - 10 reps - 5 sec hold - Seated Hamstring Stretch  - 2 x daily - 6 x weekly - 1 sets - 3 reps - 30 hold - Seated Straight Leg Heel Taps  - 2 x daily - 6 x weekly - 3 sets - 5 reps - Mini Squat with Counter Support  - 2 x daily - 6 x weekly - 1-2 sets - 10 reps     ASSESSMENT:   CLINICAL IMPRESSION: She has 3 visits left on current PT plan, we will work to progress her functional strength as much as possible to her tolerance. She had some functional weakness observed with stairs today.   OBJECTIVE IMPAIRMENTS: decreased activity tolerance, difficulty walking, decreased balance, decreased endurance, decreased mobility, decreased ROM, decreased strength, impaired flexibility, impaired LE use, postural dysfunction, and pain.   ACTIVITY LIMITATIONS: bending, lifting, carry, locomotion, cleaning, community activity, driving, and or occupation   PERSONAL FACTORS:  Rt TKA, lumbar fusion,asthma, chronic pain,vertigo, sciatica,insomnia,sleep apnea are also affecting patient's functional outcome.   REHAB POTENTIAL: Good   CLINICAL DECISION MAKING: Stable/uncomplicated   EVALUATION COMPLEXITY: Low       GOALS: Short term PT Goals Target  date: 08/21/2021 Pt will be I and compliant with HEP. Baseline:  Goal status: MET Pt will improve gait from RW to [facility] no more than supervision for 300 feet Baseline: Goal status:MET   Long term PT goals Target date: 09/18/2021 Pt will improve Rt knee PROM 0-120 to improve functional mobility Baseline: Goal status: ongoing, 4-120 on 08/3121 Pt will improve  Rt hip/knee strength to at least 5-/5 MMT to improve functional strength Baseline: Goal status: ongoing Pt will improve FOTO to at least 49% functional to show improved function Baseline: Goal status: ongoing Pt will reduce pain to overall less than 2-3/10 with usual activity and work activity. Baseline: Goal  status: 7/31/23ongoing has not yet returned to work but pain 1-3 with usual ADLS Pt will be able to ambulate community distances independently at least 750 ft WNL gait pattern without complaints Baseline: Goal status: ongoing, can now make it about 300 feet 09/22/21   PLAN: PT FREQUENCY: 1-3 times per week    PT DURATION: 6-8 weeks   PLANNED INTERVENTIONS (unless contraindicated): aquatic PT, Canalith repositioning, cryotherapy, Electrical stimulation, Iontophoresis with 4 mg/ml dexamethasome, Moist heat, traction, Ultrasound, gait training, Therapeutic exercise, balance training, neuromuscular re-education, patient/family education, prosthetic training, manual techniques, passive ROM, dry needling, taping, vasopnuematic device, vestibular, spinal manipulations, joint manipulations   PLAN FOR NEXT SESSION: work on stairs, functional strength to tolerance, vaso if desired. Transition to independent program over last 3 visits.    Debbe Odea, PT,DPT 09/29/2021, 10:57 AM

## 2021-10-01 ENCOUNTER — Encounter: Payer: Self-pay | Admitting: Physical Therapy

## 2021-10-01 ENCOUNTER — Ambulatory Visit (INDEPENDENT_AMBULATORY_CARE_PROVIDER_SITE_OTHER): Payer: PRIVATE HEALTH INSURANCE | Admitting: Physical Therapy

## 2021-10-01 DIAGNOSIS — R262 Difficulty in walking, not elsewhere classified: Secondary | ICD-10-CM

## 2021-10-01 DIAGNOSIS — M25561 Pain in right knee: Secondary | ICD-10-CM | POA: Diagnosis not present

## 2021-10-01 DIAGNOSIS — M6281 Muscle weakness (generalized): Secondary | ICD-10-CM | POA: Diagnosis not present

## 2021-10-01 DIAGNOSIS — R600 Localized edema: Secondary | ICD-10-CM

## 2021-10-01 NOTE — Therapy (Signed)
OUTPATIENT PHYSICAL THERAPY TREATMENT NOTE   Patient Name: Jessica Huff MRN: [id number] DOB:March 18, 1960, 61 y.o., female Today's Date: 8/9/2023a  END OF SESSION:   PT End of Session - 10/01/21 1100     Visit Number 16    Number of Visits 20    Date for PT Re-Evaluation 10/20/21    PT Start Time 1100    PT Stop Time 1148    PT Time Calculation (min) 48 min    Activity Tolerance Patient tolerated treatment well    Behavior During Therapy WFL for tasks assessed/performed              Past Medical History:  Diagnosis Date   Asthma    GERD (gastroesophageal reflux disease)    Hyperlipidemia    Hypertension    Past Surgical History:  Procedure Laterality Date   ANKLE SURGERY Right 2018   LUMBAR FUSION  2020   L4-L5   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   Procedure: RIGHT TOTAL KNEE ARTHROPLASTY;  Surgeon: Newt Minion, MD;  Location: [facility];  Service: Orthopedics;  Laterality: Right;   Patient Active Problem List   Diagnosis Date Noted   Total knee replacement status, right 06/25/2021   Tear of lateral meniscus of right knee 03/19/2021   Unilateral primary osteoarthritis, right knee 03/19/2021   Acute pain of right knee 02/07/2021   Malaise and fatigue 02/07/2021   Prediabetes 10/19/2018   S/P lumbar fusion 09/16/2018   Asthma in adult, moderate persistent, uncomplicated 38/25/0539   Chronic pain of right ankle 03/02/2016   Allergic rhinitis 11/29/2015   Class 1 obesity due to excess calories with serious comorbidity and body mass index (BMI) of 32.0 to 32.9 in adult 11/29/2015   Benign positional vertigo 04/07/2015   Chronic bilateral low back pain with sciatica 04/07/2015   Common migraine 04/07/2015   Hyperlipidemia 04/07/2015   Essential hypertension 04/07/2015   Insomnia 04/07/2015   Obstructive sleep apnea 04/07/2015   Lumbar radiculopathy 04/07/2015   Anxiety 04/07/2015     THERAPY DIAG:  Acute pain of right knee  Difficulty in walking, not  elsewhere classified  Muscle weakness (generalized)  Localized edema    PCP: Haydee Salter, MD   REFERRING PROVIDER: Suzan Slick, NP   REFERRING DIAG: [phone] (ICD-10-CM) - Status post total right knee replacement    Rationale for Evaluation and Treatment Rehabilitation   ONSET DATE: Rt TKA 06/25/21   SUBJECTIVE:    SUBJECTIVE STATEMENT: She reports not too much pain today but still with some weakness in Rt leg  PERTINENT HISTORY:Rt TKA, lumbar fusion,asthma, chronic pain,vertigo, sciatica,insomnia,sleep apnea     PAIN:  Are you having pain? Yes: 1/10 upon arrival Pain location: medial and posterior Rt knee Pain description: sharp and throbbing Aggravating factors: straightening her knee, steps Relieving factors:  ice   PRECAUTIONS: None   WEIGHT BEARING RESTRICTIONS No   FALLS:  Has patient fallen in last 6 months? No   LIVING ENVIRONMENT: Around 7 steps inside to bedroom   OCCUPATION: works for [name] involving standing, walking, bending, lifting   PLOF: Independent   PATIENT GOALS : walk better     OBJECTIVE:    DIAGNOSTIC FINDINGS:    PATIENT SURVEYS:  07/24/21 FOTO 23% functional intake   COGNITION:           Overall cognitive status: Within functional limits for tasks assessed  SENSATION:     EDEMA:  07/24/21 Moderate edema in Rt knee, no other signs of infection noted   MUSCLE LENGTH: Tightness noted in quads and hamsrings on Rt   POSTURE:    PALPATION:     LOWER EXTREMITY ROM:    AROM/PROM Right eval Right  6/01/25/22 Right 08/13/21 Right 09/01/21 Right 09/22/21  Hip flexion         Hip extension         Hip abduction         Hip adduction         Hip internal rotation         Hip external rotation         Knee flexion 110/115  115/120 /120 122/ 122/  Knee extension 8/5  6/4 5/3 3/2 2/  Ankle dorsiflexion         Ankle plantarflexion         Ankle inversion         Ankle  eversion          (Blank rows = not tested)   LOWER EXTREMITY MMT:   MMT in sitting Right eval Left eval Right 08/25/21 Right 09/03/21 Right 09/22/21  Hip flexion 3+       Hip extension         Hip abduction 4       Hip adduction         Hip internal rotation         Hip external rotation         Knee flexion '4 5 4 4 ' 4+  Knee extension 3+ 5 4 4+ 4+  Ankle dorsiflexion         Ankle plantarflexion         Ankle inversion         Ankle eversion          (Blank rows = not tested)   LOWER EXTREMITY SPECIAL TESTS:      FUNCTIONAL TESTS:      GAIT: 07/24/21 Distance walked: ambulates in with RW that was too short and was properly adjusted for her. She was then able to progress to [facility] 25 feet X 2 with cues and demo for technique. Close supervision needed. 07/28/21 Arrives with new front wheel RW that was also too short for her so this was adjusted to proper height and tennis balls added. Then Gait with SPC 150 feet with supervision and cues/demo for technique 07/30/21 Gait with SPC 150 feet with supervision and cues/demo for step through pattern vs her preferred step to pattern 08/06/21 Gait with SPC 150 feet with supervision and did not need cues for technique and step through pattern 08/13/21 Gait no AD 100 feet with supervision 08/25/21 Gait no AD 100 feet X2 with supervision 08/27/21 Gait no AD 100 feet X2 with supervision 09/01/21 08/27/21 Gait no AD 125 feet X2 with supervision + dynamic balance activities listed below       TODAY'S TREATMENT: 10/01/21 -stairs in clinic reciprocally up/down in hall   -Nu step L6 X 8 min -seated Rt hamstring stretch 30 sec X 3 bilat  -squats with UE support at sink X 10  -leg extension machine  10# 2X10 eccentrics (up with with both and down with Rt only), then Rt leg only 5# 2X10  -Hamstring curl machine 35# DL 2X15 -Rt hamstring stretch seated 30 sec X 3 -Sit to stands without UE support  2X10 -March walking and tandem walking 3  round trips  at counter top -Supine knee extension heel prop stretch first 5 min of vaso  -Modalities: Vasopnuematic to Rt knee X 10 min, medium compression 34 deg  09/29/21 -recumbent bike L3 X 8 min -gastroc stretch 30 sec X 3 bilat -Step up with Rt and down in front with left X10 on 6 inch step with 1 UE support -stairs in clinic reciprocally down with one handrail, then to come back up has more weakness/difficulty and must do one step at a time but leading with Rt.  -Leg press DL 125# 12 X 15, then Rt leg only 56# X10, then 50# 2X10 -leg extension machine  10# 3X10 eccentrics (up with with both and down with Rt only), then Rt leg only 5# -Hamstring curl machine 35# DL 2X15 -Rt hamstring stretch seated 30 sec X 3 -Sit to stands without UE support from raised mat X10 -March walking and tandem walking 3 round trips at counter top  -Modalities: Vasopnuematic to Rt knee X 10 min, medium compression 34 deg  09/24/21 -Nu step L6 X 8 min -gastroc stretch 30 sec X 3 bilat -Step up with Rt and down in front with left X10 on 6 inch step with 1 UE support -Lateral step up and over 6 inch step X 10 bilat with 2UE support -Leg press DL 125# 12 X 2, then Rt leg only 56# 2X10 -leg extension machine  Rt only 5# 2X10, then eccentrics (up with with both and down with Rt only) 10# 2X10 -Rt hamstring stretch seated 30 sec X 3 -Sit to stands without UE support from raised mat X10  Modalities: Vasopnuematic to Rt knee X 10 min, medium compression 34 deg  09/22/21 -Nu step L6 X 8 min -gastroc stretch 30 sec X 3 bilat -Leg press DL 125# 12 X 2, then Rt leg only 56# 2X10 -march walking 3 round trips in bars -tandem walk 3 round trips in bars -leg extension machine  Rt only 5# 2X10, then eccentrics (up with with both and down with Rt only) 10# 2X10 -Step ups 6 inch fwd and lateral X10, with one UE support -Rt hamstring stretch seated 30 sec X 3 -Sit to stands with UE support to push up then slow eccentrics no hands  to sit down X10 -Rt heel prop for knee extension stretch 5 min during first part of vaso  Modalities: Vasopnuematic to Rt knee X 10 min, medium compression 34 deg     PATIENT EDUCATION:  Education details: HEP, PT plan of care Person educated: Patient Education method: Explanation, Demonstration, Verbal cues, and Handouts Education comprehension: verbalized understanding, returned demonstration, and needs further education     HOME EXERCISE PROGRAM: Access Code: [email or web address] URL: [email or web address] Date: 07/24/2021 Prepared by: Elsie Ra   Exercises - Quad Setting and Stretching  - 2 x daily - 6 x weekly - 2 sets - 10 reps - Seated Knee Flexion Stretch  - 2 x daily - 6 x weekly - 1-2 sets - 10 reps - 5 sec hold - Seated Hamstring Stretch  - 2 x daily - 6 x weekly - 1 sets - 3 reps - 30 hold - Seated Straight Leg Heel Taps  - 2 x daily - 6 x weekly - 3 sets - 5 reps - Mini Squat with Counter Support  - 2 x daily - 6 x weekly - 1-2 sets - 10 reps     ASSESSMENT:   CLINICAL IMPRESSION: She had improved strength noted with stairs  but still lacking some functional strength that we continued to work on in PT to her tolerance. She has 2 more visits left and should be ready to discharge then.   OBJECTIVE IMPAIRMENTS: decreased activity tolerance, difficulty walking, decreased balance, decreased endurance, decreased mobility, decreased ROM, decreased strength, impaired flexibility, impaired LE use, postural dysfunction, and pain.   ACTIVITY LIMITATIONS: bending, lifting, carry, locomotion, cleaning, community activity, driving, and or occupation   PERSONAL FACTORS:  Rt TKA, lumbar fusion,asthma, chronic pain,vertigo, sciatica,insomnia,sleep apnea are also affecting patient's functional outcome.   REHAB POTENTIAL: Good   CLINICAL DECISION MAKING: Stable/uncomplicated   EVALUATION COMPLEXITY: Low       GOALS: Short term PT Goals Target date: 08/21/2021 Pt will be I  and compliant with HEP. Baseline:  Goal status: MET Pt will improve gait from RW to [facility] no more than supervision for 300 feet Baseline: Goal status:MET   Long term PT goals Target date: 09/18/2021 Pt will improve Rt knee PROM 0-120 to improve functional mobility Baseline: Goal status: ongoing, 4-120 on 08/3121 Pt will improve  Rt hip/knee strength to at least 5-/5 MMT to improve functional strength Baseline: Goal status: ongoing Pt will improve FOTO to at least 49% functional to show improved function Baseline: Goal status: ongoing Pt will reduce pain to overall less than 2-3/10 with usual activity and work activity. Baseline: Goal status: 7/31/23ongoing has not yet returned to work but pain 1-3 with usual ADLS Pt will be able to ambulate community distances independently at least 750 ft WNL gait pattern without complaints Baseline: Goal status: ongoing, can now make it about 300 feet 09/22/21   PLAN: PT FREQUENCY: 1-3 times per week    PT DURATION: 6-8 weeks   PLANNED INTERVENTIONS (unless contraindicated): aquatic PT, Canalith repositioning, cryotherapy, Electrical stimulation, Iontophoresis with 4 mg/ml dexamethasome, Moist heat, traction, Ultrasound, gait training, Therapeutic exercise, balance training, neuromuscular re-education, patient/family education, prosthetic training, manual techniques, passive ROM, dry needling, taping, vasopnuematic device, vestibular, spinal manipulations, joint manipulations   PLAN FOR NEXT SESSION: work on stairs, functional strength to tolerance, vaso if desired. Transition to independent program over last 2 visits.    Debbe Odea, PT,DPT 10/01/2021, 11:01 AM

## 2021-10-06 ENCOUNTER — Ambulatory Visit (INDEPENDENT_AMBULATORY_CARE_PROVIDER_SITE_OTHER): Payer: PRIVATE HEALTH INSURANCE | Admitting: Physical Therapy

## 2021-10-06 ENCOUNTER — Encounter: Payer: Self-pay | Admitting: Physical Therapy

## 2021-10-06 ENCOUNTER — Telehealth: Payer: Self-pay | Admitting: Orthopedic Surgery

## 2021-10-06 DIAGNOSIS — M25561 Pain in right knee: Secondary | ICD-10-CM | POA: Diagnosis not present

## 2021-10-06 DIAGNOSIS — M6281 Muscle weakness (generalized): Secondary | ICD-10-CM | POA: Diagnosis not present

## 2021-10-06 DIAGNOSIS — R262 Difficulty in walking, not elsewhere classified: Secondary | ICD-10-CM

## 2021-10-06 DIAGNOSIS — R600 Localized edema: Secondary | ICD-10-CM | POA: Diagnosis not present

## 2021-10-06 NOTE — Therapy (Signed)
OUTPATIENT PHYSICAL THERAPY TREATMENT NOTE   Patient Name: Jessica Huff MRN: [id number] DOB:01-18-1961, 61 y.o., female Today's Date: 8/14/2023a  END OF SESSION:   PT End of Session - 10/06/21 1053     Visit Number 17    Number of Visits 20    Date for PT Re-Evaluation 10/20/21    PT Start Time 1057    PT Stop Time 1145    PT Time Calculation (min) 48 min    Activity Tolerance Patient tolerated treatment well    Behavior During Therapy [facility] for tasks assessed/performed              Past Medical History:  Diagnosis Date   Asthma    GERD (gastroesophageal reflux disease)    Hyperlipidemia    Hypertension    Past Surgical History:  Procedure Laterality Date   ANKLE SURGERY Right 2018   LUMBAR FUSION  2020   L4-L5   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   Procedure: RIGHT TOTAL KNEE ARTHROPLASTY;  Surgeon: Newt Minion, MD;  Location: [facility];  Service: Orthopedics;  Laterality: Right;   Patient Active Problem List   Diagnosis Date Noted   Total knee replacement status, right 06/25/2021   Tear of lateral meniscus of right knee 03/19/2021   Unilateral primary osteoarthritis, right knee 03/19/2021   Acute pain of right knee 02/07/2021   Malaise and fatigue 02/07/2021   Prediabetes 10/19/2018   S/P lumbar fusion 09/16/2018   Asthma in adult, moderate persistent, uncomplicated 88/75/7972   Chronic pain of right ankle 03/02/2016   Allergic rhinitis 11/29/2015   Class 1 obesity due to excess calories with serious comorbidity and body mass index (BMI) of 32.0 to 32.9 in adult 11/29/2015   Benign positional vertigo 04/07/2015   Chronic bilateral low back pain with sciatica 04/07/2015   Common migraine 04/07/2015   Hyperlipidemia 04/07/2015   Essential hypertension 04/07/2015   Insomnia 04/07/2015   Obstructive sleep apnea 04/07/2015   Lumbar radiculopathy 04/07/2015   Anxiety 04/07/2015     THERAPY DIAG:  Acute pain of right knee  Difficulty in walking, not  elsewhere classified  Muscle weakness (generalized)  Localized edema    PCP: Haydee Salter, MD   REFERRING PROVIDER: Suzan Slick, NP   REFERRING DIAG: [phone] (ICD-10-CM) - Status post total right knee replacement    Rationale for Evaluation and Treatment Rehabilitation   ONSET DATE: Rt TKA 06/25/21   SUBJECTIVE:    SUBJECTIVE STATEMENT: She says she took the stairs today up to PT office.   PERTINENT HISTORY:Rt TKA, lumbar fusion,asthma, chronic pain,vertigo, sciatica,insomnia,sleep apnea     PAIN:  Are you having pain? Yes: 2-3/10 upon arrival Pain location: medial and posterior Rt knee Pain description: sharp and throbbing Aggravating factors: straightening her knee, steps Relieving factors:  ice   PRECAUTIONS: None   WEIGHT BEARING RESTRICTIONS No   FALLS:  Has patient fallen in last 6 months? No   LIVING ENVIRONMENT: Around 7 steps inside to bedroom   OCCUPATION: works for [name] involving standing, walking, bending, lifting   PLOF: Independent   PATIENT GOALS : walk better     OBJECTIVE:    DIAGNOSTIC FINDINGS:    PATIENT SURVEYS:  07/24/21 FOTO 23% functional intake   COGNITION:           Overall cognitive status: Within functional limits for tasks assessed  SENSATION:     EDEMA:  07/24/21 Moderate edema in Rt knee, no other signs of infection noted   MUSCLE LENGTH: Tightness noted in quads and hamsrings on Rt   POSTURE:    PALPATION:     LOWER EXTREMITY ROM:    AROM/PROM Right eval Right  6/01/25/22 Right 08/13/21 Right 09/01/21 Right 09/22/21 Right 10/06/21  Hip flexion          Hip extension          Hip abduction          Hip adduction          Hip internal rotation          Hip external rotation          Knee flexion 110/115  115/120 /120 122/ 122/ A:122  Knee extension 8/5  6/4 5/3 3/2 2/ A:0  Ankle dorsiflexion          Ankle plantarflexion          Ankle inversion           Ankle eversion           (Blank rows = not tested)   LOWER EXTREMITY MMT:   MMT in sitting Right eval Left eval Right 08/25/21 Right 09/03/21 Right 09/22/21  Hip flexion 3+       Hip extension         Hip abduction 4       Hip adduction         Hip internal rotation         Hip external rotation         Knee flexion _0 4+  Knee extension 3+ 5 4 4+ 4+  Ankle dorsiflexion         Ankle plantarflexion         Ankle inversion         Ankle eversion          (Blank rows = not tested)   LOWER EXTREMITY SPECIAL TESTS:      FUNCTIONAL TESTS:      GAIT: 07/24/21 Distance walked: ambulates in with RW that was too short and was properly adjusted for her. She was then able to progress to [facility] 25 feet X 2 with cues and demo for technique. Close supervision needed. 07/28/21 Arrives with new front wheel RW that was also too short for her so this was adjusted to proper height and tennis balls added. Then Gait with SPC 150 feet with supervision and cues/demo for technique 07/30/21 Gait with SPC 150 feet with supervision and cues/demo for step through pattern vs her preferred step to pattern 08/06/21 Gait with SPC 150 feet with supervision and did not need cues for technique and step through pattern 08/13/21 Gait no AD 100 feet with supervision 08/25/21 Gait no AD 100 feet X2 with supervision 08/27/21 Gait no AD 100 feet X2 with supervision 09/01/21 08/27/21 Gait no AD 125 feet X2 with supervision + dynamic balance activities listed below       TODAY'S TREATMENT: 10/06/21 -Nu step L6 X 8 min, seat #12 -Slantboard 30 sec X 3 -Leg press machine DL 100# X 20, then Rt leg only 50# X 20 -seated Rt hamstring stretch 30 sec X 3 bilat -squats with UE support at sink X 10 -leg extension machine  10# 2X10 DL  then Rt leg only 5# 2X10 -Hamstring curl machine 35# DL 2X15 -Sit to stands without UE support  2X10 -March walking and tandem walking 3 round trips at counter top  -Modalities:  Vasopnuematic to Rt knee X 10 min, medium compression 34 deg  10/01/21 -stairs in clinic reciprocally up/down in hall   -Nu step L6 X 8 min -seated Rt hamstring stretch 30 sec X 3 bilat  -squats with UE support at sink X 10  -leg extension machine  10# 2X10 eccentrics (up with with both and down with Rt only), then Rt leg only 5# 2X10  -Hamstring curl machine 35# DL 2X15 -Rt hamstring stretch seated 30 sec X 3 -Sit to stands without UE support  2X10 -March walking and tandem walking 3 round trips at counter top -Supine knee extension heel prop stretch first 5 min of vaso  -Modalities: Vasopnuematic to Rt knee X 10 min, medium compression 34 deg     PATIENT EDUCATION:  Education details: HEP, PT plan of care Person educated: Patient Education method: Explanation, Demonstration, Verbal cues, and Handouts Education comprehension: verbalized understanding, returned demonstration, and needs further education     HOME EXERCISE PROGRAM: Access Code: [email or web address] URL: [email or web address] Date: 07/24/2021 Prepared by: Elsie Ra   Exercises - Quad Setting and Stretching  - 2 x daily - 6 x weekly - 2 sets - 10 reps - Seated Knee Flexion Stretch  - 2 x daily - 6 x weekly - 1-2 sets - 10 reps - 5 sec hold - Seated Hamstring Stretch  - 2 x daily - 6 x weekly - 1 sets - 3 reps - 30 hold - Seated Straight Leg Heel Taps  - 2 x daily - 6 x weekly - 3 sets - 5 reps - Mini Squat with Counter Support  - 2 x daily - 6 x weekly - 1-2 sets - 10 reps     ASSESSMENT:   CLINICAL IMPRESSION: She has now met her ambulation goal and knee ROM goal. I will assess further strength measurements and goals in anticipation for discharge next visit.   OBJECTIVE IMPAIRMENTS: decreased activity tolerance, difficulty walking, decreased balance, decreased endurance, decreased mobility, decreased ROM, decreased strength, impaired flexibility, impaired LE use, postural dysfunction, and pain.   ACTIVITY  LIMITATIONS: bending, lifting, carry, locomotion, cleaning, community activity, driving, and or occupation   PERSONAL FACTORS:  Rt TKA, lumbar fusion,asthma, chronic pain,vertigo, sciatica,insomnia,sleep apnea are also affecting patient's functional outcome.   REHAB POTENTIAL: Good   CLINICAL DECISION MAKING: Stable/uncomplicated   EVALUATION COMPLEXITY: Low       GOALS: Short term PT Goals Target date: 08/21/2021 Pt will be I and compliant with HEP. Baseline:  Goal status: MET Pt will improve gait from RW to [facility] no more than supervision for 300 feet Baseline: Goal status:MET   Long term PT goals Target date: 09/18/2021 Pt will improve Rt knee PROM 0-120 to improve functional mobility Baseline: Goal status: ongoing, 4-120 on 08/3121 Pt will improve  Rt hip/knee strength to at least 5-/5 MMT to improve functional strength Baseline: Goal status: ongoing Pt will improve FOTO to at least 49% functional to show improved function Baseline: Goal status: ongoing Pt will reduce pain to overall less than 2-3/10 with usual activity and work activity. Baseline: Goal status: 7/31/23ongoing has not yet returned to work but pain 1-3 with usual ADLS Pt will be able to ambulate community distances independently at least 750 ft WNL gait pattern without complaints Baseline: Goal status: MET 10/06/21   PLAN: PT FREQUENCY: 1-3 times per week    PT DURATION: 6-8 weeks  PLANNED INTERVENTIONS (unless contraindicated): aquatic PT, Canalith repositioning, cryotherapy, Electrical stimulation, Iontophoresis with 4 mg/ml dexamethasome, Moist heat, traction, Ultrasound, gait training, Therapeutic exercise, balance training, neuromuscular re-education, patient/family education, prosthetic training, manual techniques, passive ROM, dry needling, taping, vasopnuematic device, vestibular, spinal manipulations, joint manipulations   PLAN FOR NEXT SESSION:  plan for discharge next visit.   Debbe Odea,  PT,DPT 10/06/2021, 10:54 AM

## 2021-10-08 ENCOUNTER — Encounter: Payer: Self-pay | Admitting: Physical Therapy

## 2021-10-08 ENCOUNTER — Ambulatory Visit (INDEPENDENT_AMBULATORY_CARE_PROVIDER_SITE_OTHER): Payer: PRIVATE HEALTH INSURANCE | Admitting: Physical Therapy

## 2021-10-08 DIAGNOSIS — M25561 Pain in right knee: Secondary | ICD-10-CM

## 2021-10-08 DIAGNOSIS — R600 Localized edema: Secondary | ICD-10-CM | POA: Diagnosis not present

## 2021-10-08 DIAGNOSIS — M6281 Muscle weakness (generalized): Secondary | ICD-10-CM

## 2021-10-08 DIAGNOSIS — R262 Difficulty in walking, not elsewhere classified: Secondary | ICD-10-CM | POA: Diagnosis not present

## 2021-10-08 NOTE — Therapy (Signed)
OUTPATIENT PHYSICAL THERAPY TREATMENT NOTE PHYSICAL THERAPY DISCHARGE SUMMARY  Visits from Start of Care: 18  Current functional level related to goals / functional outcomes: See below   Remaining deficits: See below   Education / Equipment: HEP  Plan: Patient agrees to discharge.  Patient goals were met. Patient is being discharged due to meeting the stated rehab goals.        Patient Name: Jessica Huff MRN: [id number] DOB:1961-01-01, 61 y.o., female Today's Date: 8/16/2023a  END OF SESSION:   PT End of Session - 10/08/21 1100     Visit Number 18    Number of Visits 20    Date for PT Re-Evaluation 10/20/21    PT Start Time 1100    PT Stop Time 1148    PT Time Calculation (min) 48 min    Activity Tolerance Patient tolerated treatment well    Behavior During Therapy WFL for tasks assessed/performed              Past Medical History:  Diagnosis Date   Asthma    GERD (gastroesophageal reflux disease)    Hyperlipidemia    Hypertension    Past Surgical History:  Procedure Laterality Date   ANKLE SURGERY Right 2018   LUMBAR FUSION  2020   L4-L5   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   Procedure: RIGHT TOTAL KNEE ARTHROPLASTY;  Surgeon: Newt Minion, MD;  Location: [place];  Service: Orthopedics;  Laterality: Right;   Patient Active Problem List   Diagnosis Date Noted   Total knee replacement status, right 06/25/2021   Tear of lateral meniscus of right knee 03/19/2021   Unilateral primary osteoarthritis, right knee 03/19/2021   Acute pain of right knee 02/07/2021   Malaise and fatigue 02/07/2021   Prediabetes 10/19/2018   S/P lumbar fusion 09/16/2018   Asthma in adult, moderate persistent, uncomplicated 34/19/3790   Chronic pain of right ankle 03/02/2016   Allergic rhinitis 11/29/2015   Class 1 obesity due to excess calories with serious comorbidity and body mass index (BMI) of 32.0 to 32.9 in adult 11/29/2015   Benign positional vertigo 04/07/2015    Chronic bilateral low back pain with sciatica 04/07/2015   Common migraine 04/07/2015   Hyperlipidemia 04/07/2015   Essential hypertension 04/07/2015   Insomnia 04/07/2015   Obstructive sleep apnea 04/07/2015   Lumbar radiculopathy 04/07/2015   Anxiety 04/07/2015     THERAPY DIAG:  Acute pain of right knee  Difficulty in walking, not elsewhere classified  Muscle weakness (generalized)  Localized edema    PCP: Haydee Salter, MD   REFERRING PROVIDER: Suzan Slick, NP   REFERRING DIAG: [phone] (ICD-10-CM) - Status post total right knee replacement    Rationale for Evaluation and Treatment Rehabilitation   ONSET DATE: Rt TKA 06/25/21   SUBJECTIVE:    SUBJECTIVE STATEMENT: She feels ready to discharge to HEP today.  PERTINENT HISTORY:Rt TKA, lumbar fusion,asthma, chronic pain,vertigo, sciatica,insomnia,sleep apnea     PAIN:  Are you having pain? Yes: 1/10 upon arrival Pain location: medial and posterior Rt knee Pain description: sharp and throbbing Aggravating factors: straightening her knee, steps Relieving factors:  ice   PRECAUTIONS: None   WEIGHT BEARING RESTRICTIONS No   FALLS:  Has patient fallen in last 6 months? 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, walking, bending, lifting   PLOF: Independent   PATIENT GOALS : walk better  OBJECTIVE:    DIAGNOSTIC FINDINGS:    PATIENT SURVEYS:  07/24/21 FOTO 23% functional intake 10/08/21: FOTO improved to 63%   COGNITION:           Overall cognitive status: Within functional limits for tasks assessed                          SENSATION:     EDEMA:  07/24/21 Moderate edema in Rt knee, no other signs of infection noted   MUSCLE LENGTH: Tightness noted in quads and hamsrings on Rt   POSTURE:    PALPATION:     LOWER EXTREMITY ROM:    AROM/PROM Right eval Right  6/01/25/22 Right 08/13/21  Right 09/01/21 Right 09/22/21 Right 10/06/21  Hip flexion          Hip extension          Hip abduction          Hip adduction          Hip internal rotation          Hip external rotation          Knee flexion 110/115  115/120 /120 122/ 122/ A:122  Knee extension 8/5  6/4 5/3 3/2 2/ A:0  Ankle dorsiflexion          Ankle plantarflexion          Ankle inversion          Ankle eversion           (Blank rows = not tested)   LOWER EXTREMITY MMT:   MMT in sitting Right eval Left eval Right 08/25/21 Right 09/03/21 Right 09/22/21 Right 10/08/21  Hip flexion 3+        Hip extension          Hip abduction 4        Hip adduction          Hip internal rotation          Hip external rotation          Knee flexion _0 4+ 5-  Knee extension 3+ 5 4 4+ 4+ 5-  Ankle dorsiflexion          Ankle plantarflexion          Ankle inversion          Ankle eversion           (Blank rows = not tested)   LOWER EXTREMITY SPECIAL TESTS:      FUNCTIONAL TESTS:      GAIT: 07/24/21 Distance walked: ambulates in with RW that was too short and was properly adjusted for her. She was then able to progress to [facility] 25 feet X 2 with cues and demo for technique. Close supervision needed. 07/28/21 Arrives with new front wheel RW that was also too short for her so this was adjusted to proper height and tennis balls added. Then Gait with SPC 150 feet with supervision and cues/demo for technique 07/30/21 Gait with SPC 150 feet with supervision and cues/demo for step through pattern vs her preferred step to pattern 08/06/21 Gait with SPC 150 feet with supervision and did not need cues for technique and step through pattern 08/13/21 Gait no AD 100 feet with supervision 08/25/21 Gait no AD 100 feet X2 with supervision 08/27/21 Gait no AD 100 feet X2 with supervision 09/01/21 08/27/21 Gait no AD 125 feet X2 with supervision + dynamic balance  activities listed below       TODAY'S TREATMENT: 10/08/21 -Nu step L6 X 8  min, seat #12 -Slantboard 30 sec X 3 -Leg press machine DL 100# X 20, then Rt leg only 50# X 20 -seated Rt hamstring stretch 30 sec X 3 bilat -squats with UE support at sink X 10 -leg extension machine  10# 2X10 DL  then Rt leg only 5# 2X10 -Hamstring curl machine 35# DL 2X15 -Sit to stands without UE support  2X10 -March walking and tandem walking 3 round trips at counter top  -Modalities: Vasopnuematic to Rt knee X 10 min, medium compression 34 deg     PATIENT EDUCATION:  Education details: HEP, PT plan of care Person educated: Patient Education method: Explanation, Demonstration, Verbal cues, and Handouts Education comprehension: verbalized understanding, returned demonstration, and needs further education     HOME EXERCISE PROGRAM: Access Code: [email or web address] URL: [email or web address] Date: 07/24/2021 Prepared by: Elsie Ra   Exercises - Quad Setting and Stretching  - 2 x daily - 6 x weekly - 2 sets - 10 reps - Seated Knee Flexion Stretch  - 2 x daily - 6 x weekly - 1-2 sets - 10 reps - 5 sec hold - Seated Hamstring Stretch  - 2 x daily - 6 x weekly - 1 sets - 3 reps - 30 hold - Seated Straight Leg Heel Taps  - 2 x daily - 6 x weekly - 3 sets - 5 reps - Mini Squat with Counter Support  - 2 x daily - 6 x weekly - 1-2 sets - 10 reps     ASSESSMENT:   CLINICAL IMPRESSION: She has now met her PT goals and will be discharged to independent program. She is in agreement and had no further questions or concerns.    OBJECTIVE IMPAIRMENTS: decreased activity tolerance, difficulty walking, decreased balance, decreased endurance, decreased mobility, decreased ROM, decreased strength, impaired flexibility, impaired LE use, postural dysfunction, and pain.   ACTIVITY LIMITATIONS: bending, lifting, carry, locomotion, cleaning, community activity, driving, and or occupation   PERSONAL FACTORS:  Rt TKA, lumbar fusion,asthma, chronic pain,vertigo, sciatica,insomnia,sleep apnea  are also affecting patient's functional outcome.   REHAB POTENTIAL: Good   CLINICAL DECISION MAKING: Stable/uncomplicated   EVALUATION COMPLEXITY: Low       GOALS: Short term PT Goals Target date: 08/21/2021 Pt will be I and compliant with HEP. Baseline:  Goal status: MET Pt will improve gait from RW to [facility] no more than supervision for 300 feet Baseline: Goal status:MET   Long term PT goals Target date: 09/18/2021 Pt will improve Rt knee PROM 0-120 to improve functional mobility Baseline: Goal status: MET 0-122 on 10/08/21 Pt will improve  Rt hip/knee strength to at least 5-/5 MMT to improve functional strength Baseline: Goal status: MET 8/16 Pt will improve FOTO to at least 49% functional to show improved function Baseline: Goal status: MET 10/08/21 improved to 63% Pt will reduce pain to overall less than 2-3/10 with usual activity and work activity. Baseline: Goal status: met for usual activity, has not yet been back to work Pt will be able to ambulate community distances independently at least 750 ft WNL gait pattern without complaints Baseline: Goal status: MET 10/06/21   PLAN: PT FREQUENCY: 1-3 times per week    PT DURATION: 6-8 weeks   PLANNED INTERVENTIONS (unless contraindicated): aquatic PT, Canalith repositioning, cryotherapy, Electrical stimulation, Iontophoresis with 4 mg/ml dexamethasome, Moist heat, traction, Ultrasound, gait training, Therapeutic exercise, balance training, neuromuscular re-education, patient/family  education, prosthetic training, manual techniques, passive ROM, dry needling, taping, vasopnuematic device, vestibular, spinal manipulations, joint manipulations   PLAN FOR NEXT SESSION:  DC today   Debbe Odea, PT,DPT 10/08/2021, 11:01 AM

## 2021-10-14 ENCOUNTER — Telehealth: Payer: Self-pay | Admitting: Orthopedic Surgery

## 2021-10-14 NOTE — Telephone Encounter (Signed)
Pt called and states she needs to extend her out of work note because she does not feel she is ready to go back.   Cb [phone] [id number]

## 2021-10-15 NOTE — Telephone Encounter (Signed)
Called pt she is 3 months out from a total knee and completed PT on 10/08/2021. Return to work date is 10/24/2021 advised should come in for eval if she does not think she can return to work. Appt sch for next Tuesday at 3 pm

## 2021-10-21 ENCOUNTER — Telehealth: Payer: Self-pay | Admitting: Orthopedic Surgery

## 2021-10-21 ENCOUNTER — Ambulatory Visit (INDEPENDENT_AMBULATORY_CARE_PROVIDER_SITE_OTHER): Payer: PRIVATE HEALTH INSURANCE | Admitting: Orthopedic Surgery

## 2021-10-21 DIAGNOSIS — Z96651 Presence of right artificial knee joint: Secondary | ICD-10-CM

## 2021-10-21 NOTE — Telephone Encounter (Signed)
[employer] forms received. To [employer]. 

## 2021-10-22 ENCOUNTER — Ambulatory Visit (INDEPENDENT_AMBULATORY_CARE_PROVIDER_SITE_OTHER): Payer: PRIVATE HEALTH INSURANCE

## 2021-10-22 DIAGNOSIS — Z23 Encounter for immunization: Secondary | ICD-10-CM

## 2021-10-22 NOTE — Progress Notes (Signed)
Per orders of Dr. Veto Kemps, pt is here for Influenza vaccine (regular dose). pt received vaccine in left deltoid at 9:30. Given by [place] L. CMA/CPT. Pt tolerated Influenza vaccine well.

## 2021-10-28 ENCOUNTER — Other Ambulatory Visit: Payer: Self-pay | Admitting: Physical Medicine and Rehabilitation

## 2021-10-28 ENCOUNTER — Encounter: Payer: Self-pay | Admitting: Orthopedic Surgery

## 2021-10-28 ENCOUNTER — Telehealth: Payer: Self-pay | Admitting: Orthopedic Surgery

## 2021-10-28 NOTE — Telephone Encounter (Signed)
Received $25.00 cash and medical records release form from patient /   Forwarding to [employer] today 

## 2021-10-28 NOTE — Progress Notes (Signed)
Office Visit Note   Patient: Jessica Huff           Date of Birth: 08-10-1960           MRN: [id number] Visit Date: 10/21/2021              Requested by: Loyola Mast, MD [address] [place],  [place] [address] PCP: Loyola Mast, MD  Chief Complaint  Patient presents with   Right Knee - Follow-up    06/25/21 right total knee replacement      HPI: Patient is 4 months status post right total knee arthroplasty.  Patient has made good progress with therapy but does not have sufficient mobility or strength to return to work at this time.  Assessment & Plan: Visit Diagnoses:  1. Status post total right knee replacement     Plan: Patient is given a note to stay out of work with return to work October 1.  Follow-Up Instructions: Return in about 4 weeks (around 11/18/2021).   Ortho Exam  Patient is alert, oriented, no adenopathy, well-dressed, normal affect, normal respiratory effort. Examination there is no effusion the patella tracks well patient states she fell yesterday she still has some weakness and is using a cane for ambulation.  Collateral ligaments are stable.  Imaging: No results found. No images are attached to the encounter.  Labs: Lab Results  Component Value Date   HGBA1C 5.9 07/18/2020     No results found for: "ALBUMIN", "PREALBUMIN", "CBC"  No results found for: "MG" No results found for: "VD25OH"  No results found for: "PREALBUMIN"    Latest Ref Rng & Units 06/10/2021   11:16 AM 02/07/2021    4:44 PM  CBC EXTENDED  WBC 4.0 - 10.5 K/uL 5.8  5.2   RBC 3.87 - 5.11 MIL/uL 4.77  4.80   Hemoglobin 12.0 - 15.0 g/dL 79.8  92.1   HCT 19.4 - 46.0 % 42.1  40.6   Platelets 150 - 400 K/uL 277  264      There is no height or weight on file to calculate BMI.  Orders:  No orders of the defined types were placed in this encounter.  No orders of the defined types were placed in this encounter.    Procedures: No procedures  performed  Clinical Data: No additional findings.  ROS:  All other systems negative, except as noted in the HPI. Review of Systems  Objective: Vital Signs: There were no vitals taken for this visit.  Specialty Comments:  No specialty comments available.  PMFS History: Patient Active Problem List   Diagnosis Date Noted   Total knee replacement status, right 06/25/2021   Tear of lateral meniscus of right knee 03/19/2021   Unilateral primary osteoarthritis, right knee 03/19/2021   Acute pain of right knee 02/07/2021   Malaise and fatigue 02/07/2021   Prediabetes 10/19/2018   S/P lumbar fusion 09/16/2018   Asthma in adult, moderate persistent, uncomplicated 07/03/2016   Chronic pain of right ankle 03/02/2016   Allergic rhinitis 11/29/2015   Class 1 obesity due to excess calories with serious comorbidity and body mass index (BMI) of 32.0 to 32.9 in adult 11/29/2015   Benign positional vertigo 04/07/2015   Chronic bilateral low back pain with sciatica 04/07/2015   Common migraine 04/07/2015   Hyperlipidemia 04/07/2015   Essential hypertension 04/07/2015   Insomnia 04/07/2015   Obstructive sleep apnea 04/07/2015   Lumbar radiculopathy 04/07/2015   Anxiety 04/07/2015   Past Medical  History:  Diagnosis Date   Asthma    GERD (gastroesophageal reflux disease)    Hyperlipidemia    Hypertension     Family History  Problem Relation Age of Onset   Diabetes Mother    Kidney disease Mother    Cancer Father        Kidney    Past Surgical History:  Procedure Laterality Date   ANKLE SURGERY Right 2018   LUMBAR FUSION  2020   L4-L5   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   Procedure: RIGHT TOTAL KNEE ARTHROPLASTY;  Surgeon: Nadara Mustard, MD;  Location: [facility];  Service: Orthopedics;  Laterality: Right;   Social History   Occupational History   Not on file  Tobacco Use   Smoking status: Never   Smokeless tobacco: Never  Vaping Use   Vaping Use: Never used  Substance  and Sexual Activity   Alcohol use: Yes    Comment: socially   Drug use: Never   Sexual activity: Yes

## 2021-11-03 ENCOUNTER — Other Ambulatory Visit: Payer: Self-pay | Admitting: Physical Medicine and Rehabilitation

## 2021-12-19 ENCOUNTER — Encounter: Payer: Self-pay | Admitting: Physical Medicine and Rehabilitation

## 2021-12-22 ENCOUNTER — Ambulatory Visit: Payer: Self-pay | Admitting: Family Medicine

## 2021-12-31 ENCOUNTER — Ambulatory Visit: Payer: Self-pay | Admitting: Family Medicine

## 2022-01-12 ENCOUNTER — Ambulatory Visit: Payer: Self-pay | Admitting: Family Medicine

## 2022-01-19 ENCOUNTER — Ambulatory Visit (INDEPENDENT_AMBULATORY_CARE_PROVIDER_SITE_OTHER): Payer: PRIVATE HEALTH INSURANCE | Admitting: Family Medicine

## 2022-01-19 VITALS — BP 124/68 | HR 83 | Temp 97.6°F | Ht 72.0 in | Wt 235.6 lb

## 2022-01-19 DIAGNOSIS — I10 Essential (primary) hypertension: Secondary | ICD-10-CM

## 2022-01-19 DIAGNOSIS — J4540 Moderate persistent asthma, uncomplicated: Secondary | ICD-10-CM

## 2022-01-19 DIAGNOSIS — R296 Repeated falls: Secondary | ICD-10-CM | POA: Diagnosis not present

## 2022-01-19 DIAGNOSIS — M79604 Pain in right leg: Secondary | ICD-10-CM

## 2022-01-19 DIAGNOSIS — Z96651 Presence of right artificial knee joint: Secondary | ICD-10-CM

## 2022-01-19 NOTE — Progress Notes (Signed)
[facility] [address] [place] [address] Dept: [phone] Dept Fax: [phone]  Chronic Care Office Visit  Subjective:    Patient ID: Jessica Huff, female    DOB: 02-15-1961, 61 y.o..   MRN: [id number]  Chief Complaint  Patient presents with   Follow-up    3 month f/u. No concerns.       History of Present Illness:  Patient is in today for reassessment of chronic medical issues.  Ms. Amico underwent a right total knee joint replacement in May 2023. She completed a full course of physical therapy. Since stopping therapy, she does note some pain in the lower thigh area. She has also experienced several falls at home. She is unclear as to what may have caused this. she notes she is limited in her ability to stand for long periods and in bending, crouching, stooping. As such, she did stop working for [employer].  Ms. Gulledge has a history of high blood pressure. She is currently on Hyzaar 100-25 mg daily and managing well.   Ms. Schlicker has a history of moderate persistent asthma.She is managed on Flovent and PRN albuterol. She has not had any recent flares.    Past Medical History: Patient Active Problem List   Diagnosis Date Noted   Total knee replacement status, right 06/25/2021   Acute pain of right knee 02/07/2021   Malaise and fatigue 02/07/2021   Prediabetes 10/19/2018   S/P lumbar fusion 09/16/2018   Asthma in adult, moderate persistent, uncomplicated 07/03/2016   Chronic pain of right ankle 03/02/2016   Allergic rhinitis 11/29/2015   Class 1 obesity due to excess calories with serious comorbidity and body mass index (BMI) of 32.0 to 32.9 in adult 11/29/2015   Benign positional vertigo 04/07/2015   Chronic bilateral low back pain with sciatica 04/07/2015   Common migraine 04/07/2015   Hyperlipidemia 04/07/2015   Essential hypertension 04/07/2015   Insomnia 04/07/2015   Obstructive sleep apnea  04/07/2015   Lumbar radiculopathy 04/07/2015   Anxiety 04/07/2015   Past Surgical History:  Procedure Laterality Date   ANKLE SURGERY Right 2018   LUMBAR FUSION  2020   L4-L5   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   Procedure: RIGHT TOTAL KNEE ARTHROPLASTY;  Surgeon: Nadara Mustard, MD;  Location: [facility];  Service: Orthopedics;  Laterality: Right;   Family History  Problem Relation Age of Onset   Diabetes Mother    Kidney disease Mother    Cancer Father        Kidney   Outpatient Medications Prior to Visit  Medication Sig Dispense Refill   albuterol (VENTOLIN HFA) 108 (90 Base) MCG/ACT inhaler INHALE 2 PUFFS INTO THE LUNGS EVERY 6 HOURS AS NEEDED 6.7 g 6   amitriptyline (ELAVIL) 25 MG tablet Take 1 tablet (25 mg total) by mouth at bedtime. 30 tablet 4   ezetimibe (ZETIA) 10 MG tablet TAKE 1 TABLET(10 MG) BY MOUTH DAILY 90 tablet 3   fluticasone (FLONASE) 50 MCG/ACT nasal spray SHAKE LIQUID AND USE 2 SPRAYS IN EACH NOSTRIL DAILY (Patient taking differently: Place 2 sprays into both nostrils daily as needed for allergies or rhinitis.) 16 g 6   fluticasone (FLOVENT HFA) 220 MCG/ACT inhaler Inhale 1 puff into the lungs in the morning and at bedtime. 1 each 12   levocetirizine (XYZAL) 5 MG tablet Take 5 mg by mouth daily as needed for allergies.     losartan-hydrochlorothiazide (HYZAAR) 100-25 MG tablet TAKE 1  TABLET BY MOUTH DAILY 90 tablet 3   montelukast (SINGULAIR) 10 MG tablet Take 1 tablet (10 mg total) by mouth at bedtime. 30 tablet 1   Multiple Vitamin (MULTI-VITAMIN) tablet Take 1 tablet by mouth daily.     oxyCODONE-acetaminophen (PERCOCET/ROXICET) 5-325 MG tablet Take 1 tablet by mouth every 8 (eight) hours as needed. 30 tablet 0   pregabalin (LYRICA) 100 MG capsule TAKE 1 CAPSULE BY MOUTH DAILY DAY OR NIGHT FOR 1 WEEK, INCREASE TO 100 MG TWICE DAILY FOR NERVE PAIN 332 capsule 1   rosuvastatin (CRESTOR) 40 MG tablet TAKE 1 TABLET(40 MG) BY MOUTH DAILY 90 tablet 3   No  facility-administered medications prior to visit.   No Known Allergies    Objective:   Today's Vitals   01/19/22 1423  BP: 124/68  Pulse: 83  Temp: 97.6 F (36.4 C)  TempSrc: Temporal  SpO2: 98%  Weight: 235 lb 9.6 oz (106.9 kg)  Height: 6' (1.829 m)   Body mass index is 31.95 kg/m.   General: Well developed, well nourished. No acute distress. Lungs: Clear to auscultation bilaterally. No wheezing, rales or rhonchi. Extremities: Full ROM of right knee. No joint swelling or tenderness. The gait is mildly wide.   She has limited ability to do deep knee bends. Psych: Alert and oriented. Normal mood and affect.  Health Maintenance Due  Topic Date Due   HIV Screening  Never done   Hepatitis C Screening  Never done     Assessment & Plan:   1. Knee joint replacement status, right 2. Right leg pain 3. Multiple falls I am concerned about Ms. Poulton's recent falls. She has stopped working. Right now she is in transition to implement her COBRA coverage. I recommend we have her engage back in physical therapy to improve her gait and reduce falls.  4. Essential hypertension Blood pressure is at goal. Continue Hyzaar 100-25 mg daily.  5. Asthma in adult, moderate persistent, uncomplicated Continue Flovent 1 puff bid and PRN albuterol.   Return in about 6 weeks (around 03/02/2022).   Loyola Mast, MD

## 2022-02-02 ENCOUNTER — Telehealth: Payer: Self-pay | Admitting: Family Medicine

## 2022-02-02 ENCOUNTER — Encounter: Payer: Self-pay | Admitting: Orthopedic Surgery

## 2022-02-02 ENCOUNTER — Ambulatory Visit (INDEPENDENT_AMBULATORY_CARE_PROVIDER_SITE_OTHER): Payer: PRIVATE HEALTH INSURANCE | Admitting: Orthopedic Surgery

## 2022-02-02 DIAGNOSIS — Z96651 Presence of right artificial knee joint: Secondary | ICD-10-CM | POA: Diagnosis not present

## 2022-02-02 NOTE — Telephone Encounter (Signed)
Caller Name: Jessica Huff Call back phone #: [phone]  Reason for Call: Has anything been received by [employer]? Pt said paperwork should have been sent over for Dr. Veto Kemps to fill out

## 2022-02-02 NOTE — Telephone Encounter (Signed)
Left detailed message that no fax has been received and gave the fax number to her. Dm/cma

## 2022-02-02 NOTE — Progress Notes (Signed)
Office Visit Note   Patient: Jessica Huff           Date of Birth: [birth year]           MRN: [id number] Visit Date: 02/02/2022              Requested by: Loyola Mast, MD [address] [place],  [place] [address] PCP: Loyola Mast, MD  Chief Complaint  Patient presents with   Right Knee - Pain    Hx 06/25/21 right total knee replacement      HPI: Patient is a 61 year old woman who is status post a right total knee arthroplasty about 7 months ago.  Patient states she still has weakness.  Patient received a severance package from her work and has not been back to work.  Patient states she still has difficulty with walking balance with frequent falls.  Assessment & Plan: Visit Diagnoses:  1. Status post total right knee replacement     Plan: Patient will continue with therapy and strengthening.  She will follow-up as needed.  It is unclear when patient would have the strength to return to work.  Anticipate this may be about 3 months.  Follow-Up Instructions: Return if symptoms worsen or fail to improve.   Ortho Exam  Patient is alert, oriented, no adenopathy, well-dressed, normal affect, normal respiratory effort. Examination the patient is currently ambulating without her cane.  She has to raise  the right knee for her foot to clear the floor with posterior tibial tendon insufficiency on the right.  There is no effusion of the right knee collateral ligaments are stable the patella tracks midline.  No signs of infection.  Imaging: No results found. No images are attached to the encounter.  Labs: Lab Results  Component Value Date   HGBA1C 5.9 07/18/2020     No results found for: "ALBUMIN", "PREALBUMIN", "CBC"  No results found for: "MG" No results found for: "VD25OH"  No results found for: "PREALBUMIN"    Latest Ref Rng & Units 06/10/2021   11:16 AM 02/07/2021    4:44 PM  CBC EXTENDED  WBC 4.0 - 10.5 K/uL 5.8  5.2   RBC 3.87 - 5.11 MIL/uL 4.77   4.80   Hemoglobin 12.0 - 15.0 g/dL 37.3  42.8   HCT 76.8 - 46.0 % 42.1  40.6   Platelets 150 - 400 K/uL 277  264      There is no height or weight on file to calculate BMI.  Orders:  No orders of the defined types were placed in this encounter.  No orders of the defined types were placed in this encounter.    Procedures: No procedures performed  Clinical Data: No additional findings.  ROS:  All other systems negative, except as noted in the HPI. Review of Systems  Objective: Vital Signs: There were no vitals taken for this visit.  Specialty Comments:  No specialty comments available.  PMFS History: Patient Active Problem List   Diagnosis Date Noted   Total knee replacement status, right 06/25/2021   Acute pain of right knee 02/07/2021   Malaise and fatigue 02/07/2021   Prediabetes 10/19/2018   S/P lumbar fusion 09/16/2018   Asthma in adult, moderate persistent, uncomplicated 07/03/2016   Chronic pain of right ankle 03/02/2016   Allergic rhinitis 11/29/2015   Class 1 obesity due to excess calories with serious comorbidity and body mass index (BMI) of 32.0 to 32.9 in adult 11/29/2015   Benign positional vertigo  04/07/2015   Chronic bilateral low back pain with sciatica 04/07/2015   Common migraine 04/07/2015   Hyperlipidemia 04/07/2015   Essential hypertension 04/07/2015   Insomnia 04/07/2015   Obstructive sleep apnea 04/07/2015   Lumbar radiculopathy 04/07/2015   Anxiety 04/07/2015   Past Medical History:  Diagnosis Date   Asthma    GERD (gastroesophageal reflux disease)    Hyperlipidemia    Hypertension     Family History  Problem Relation Age of Onset   Diabetes Mother    Kidney disease Mother    Cancer Father        Kidney    Past Surgical History:  Procedure Laterality Date   ANKLE SURGERY Right 2018   LUMBAR FUSION  2020   L4-L5   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   Procedure: RIGHT TOTAL KNEE ARTHROPLASTY;  Surgeon: Nadara Mustard,  MD;  Location: [facility];  Service: Orthopedics;  Laterality: Right;   Social History   Occupational History   Not on file  Tobacco Use   Smoking status: Never   Smokeless tobacco: Never  Vaping Use   Vaping Use: Never used  Substance and Sexual Activity   Alcohol use: Yes    Comment: socially   Drug use: Never   Sexual activity: Yes

## 2022-02-05 ENCOUNTER — Telehealth: Payer: Self-pay | Admitting: Orthopedic Surgery

## 2022-02-05 NOTE — Telephone Encounter (Signed)
Patient wants to know if Dr Lajoyce Corners received paper work from [employer]. Best contact [phone]

## 2022-02-05 NOTE — Telephone Encounter (Signed)
Spoke to patient and advised that we still haven't received any faxes.  She will reach back out to them.  Dm/cma

## 2022-02-06 NOTE — Telephone Encounter (Signed)
Form placed on providers desk to be filled out. Dm/cma  

## 2022-02-06 NOTE — Telephone Encounter (Signed)
We haven't received STD forms since September. Thanks.

## 2022-02-06 NOTE — Telephone Encounter (Signed)
Type of forms received:disability   Routed WQ:VLDK rudd  Paperwork received by :terrill    Individual made aware of 3-5 business day turn around (Y/N): yes Form completed and patient made aware of charges(Y/N):yes   Faxed to :   Form location:  rudd box

## 2022-02-06 NOTE — Telephone Encounter (Signed)
Appointment scheduled for 02/11/22 @ 3:00 pm for form to be filled out per provider. Dm/cma

## 2022-02-09 NOTE — Telephone Encounter (Signed)
Pt informed. She will contact them to fax another one with Dr. Audrie Lia name on form.

## 2022-02-11 ENCOUNTER — Ambulatory Visit (INDEPENDENT_AMBULATORY_CARE_PROVIDER_SITE_OTHER): Payer: PRIVATE HEALTH INSURANCE | Admitting: Family Medicine

## 2022-02-11 ENCOUNTER — Encounter: Payer: Self-pay | Admitting: Family Medicine

## 2022-02-11 VITALS — BP 126/80 | HR 68 | Temp 97.6°F | Ht 72.0 in | Wt 232.2 lb

## 2022-02-11 DIAGNOSIS — Z96651 Presence of right artificial knee joint: Secondary | ICD-10-CM | POA: Diagnosis not present

## 2022-02-11 NOTE — Progress Notes (Signed)
[facility] [address] [place] [address] Dept: [phone] Dept Fax: [phone]  Chronic Care Office Visit  Subjective:    Patient ID: Jessica Huff, female    DOB: 1960/06/15, 61 y.o..   MRN: [id number]  Chief Complaint  Patient presents with   Follow-up    F/u to fill out [employer] form.      History of Present Illness:  Patient is in today for reassessment of chronic medical issues.  Jessica Huff underwent a right total knee joint replacement in May 2023. She completed a full course of physical therapy. Since stopping therapy, she has been having increased pain in the lower thigh area. She had also experienced several falls at home. She notes she is limited in her ability to stand for long periods and in bending, crouching, stooping. As such, she did stop working for [employer]. She was recently seen by Dr. Sharol Given. He has advised that she will not be ready to go back to work for at least 3 months. Jessica Huff has disability forms that need completion.  Past Medical History: Patient Active Problem List   Diagnosis Date Noted   Total knee replacement status, right 06/25/2021   Acute pain of right knee 02/07/2021   Malaise and fatigue 02/07/2021   Prediabetes 10/19/2018   S/P lumbar fusion 09/16/2018   Asthma in adult, moderate persistent, uncomplicated 53/61/4431   Chronic pain of right ankle 03/02/2016   Allergic rhinitis 11/29/2015   Class 1 obesity due to excess calories with serious comorbidity and body mass index (BMI) of 32.0 to 32.9 in adult 11/29/2015   Benign positional vertigo 04/07/2015   Chronic bilateral low back pain with sciatica 04/07/2015   Common migraine 04/07/2015   Hyperlipidemia 04/07/2015   Essential hypertension 04/07/2015   Insomnia 04/07/2015   Obstructive sleep apnea 04/07/2015   Lumbar radiculopathy 04/07/2015   Anxiety 04/07/2015   Past Surgical History:  Procedure Laterality Date    ANKLE SURGERY Right 2018   LUMBAR FUSION  2020   L4-L5   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   Procedure: RIGHT TOTAL KNEE ARTHROPLASTY;  Surgeon: Newt Minion, MD;  Location: [facility];  Service: Orthopedics;  Laterality: Right;   Family History  Problem Relation Age of Onset   Diabetes Mother    Kidney disease Mother    Cancer Father        Kidney    Outpatient Medications Prior to Visit  Medication Sig Dispense Refill   albuterol (VENTOLIN HFA) 108 (90 Base) MCG/ACT inhaler INHALE 2 PUFFS INTO THE LUNGS EVERY 6 HOURS AS NEEDED 6.7 g 6   amitriptyline (ELAVIL) 25 MG tablet Take 1 tablet (25 mg total) by mouth at bedtime. 30 tablet 4   ezetimibe (ZETIA) 10 MG tablet TAKE 1 TABLET(10 MG) BY MOUTH DAILY 90 tablet 3   fluticasone (FLONASE) 50 MCG/ACT nasal spray SHAKE LIQUID AND USE 2 SPRAYS IN EACH NOSTRIL DAILY (Patient taking differently: Place 2 sprays into both nostrils daily as needed for allergies or rhinitis.) 16 g 6   fluticasone (FLOVENT HFA) 220 MCG/ACT inhaler Inhale 1 puff into the lungs in the morning and at bedtime. 1 each 12   levocetirizine (XYZAL) 5 MG tablet Take 5 mg by mouth daily as needed for allergies.     losartan-hydrochlorothiazide (HYZAAR) 100-25 MG tablet TAKE 1 TABLET BY MOUTH DAILY 90 tablet 3   montelukast (SINGULAIR) 10 MG tablet Take 1 tablet (10 mg total) by  mouth at bedtime. 30 tablet 1   Multiple Vitamin (MULTI-VITAMIN) tablet Take 1 tablet by mouth daily.     oxyCODONE-acetaminophen (PERCOCET/ROXICET) 5-325 MG tablet Take 1 tablet by mouth every 8 (eight) hours as needed. 30 tablet 0   pregabalin (LYRICA) 100 MG capsule TAKE 1 CAPSULE BY MOUTH DAILY DAY OR NIGHT FOR 1 WEEK, INCREASE TO 100 MG TWICE DAILY FOR NERVE PAIN 332 capsule 1   rosuvastatin (CRESTOR) 40 MG tablet TAKE 1 TABLET(40 MG) BY MOUTH DAILY 90 tablet 3   No facility-administered medications prior to visit.   No Known Allergies    Objective:   Today's Vitals   02/11/22 1445   BP: 126/80  Pulse: 68  Temp: 97.6 F (36.4 C)  TempSrc: Temporal  SpO2: 98%  Weight: 232 lb 3.2 oz (105.3 kg)  Height: 6' (1.829 m)   Body mass index is 31.49 kg/m.   General: Well developed, well nourished. No acute distress. Psych: Alert and oriented. Normal mood and affect.  Health Maintenance Due  Topic Date Due   HIV Screening  Never done   Hepatitis C Screening  Never done   DTaP/Tdap/Td (2 - Td or Tdap) 01/11/2022     Assessment & Plan:   1. Knee joint replacement status, right She will continue physical therapy. Dr. Sharol Given has not determined if she will eventually have enough strength return for her to resume work. He anticipates she needs at least 3 more months. I recommend she be using Aleve 220 mg 1-2 tablets about a hour before bedtime to help with nighttime pain and poor sleep. I completed "Attending Physician Statement" of her disability claim.  Return for As scheduled.   Haydee Salter, MD

## 2022-02-12 ENCOUNTER — Telehealth: Payer: Self-pay | Admitting: Family Medicine

## 2022-02-12 NOTE — Telephone Encounter (Signed)
Pt called and stated that you called her but she missed your call

## 2022-02-12 NOTE — Telephone Encounter (Signed)
[employer] has completed these forms.

## 2022-02-12 NOTE — Telephone Encounter (Signed)
Patient advised that her [employer] form has been faxed.  Dm/cma

## 2022-02-12 NOTE — Telephone Encounter (Signed)
[employer] form filled out and faxed along with x-ray, CT, MRI results of knee to [phone].  Patient notified [place] phone.  Dm/cma

## 2022-02-25 ENCOUNTER — Other Ambulatory Visit: Payer: Self-pay | Admitting: Family Medicine

## 2022-02-25 DIAGNOSIS — J301 Allergic rhinitis due to pollen: Secondary | ICD-10-CM

## 2022-03-02 ENCOUNTER — Ambulatory Visit (INDEPENDENT_AMBULATORY_CARE_PROVIDER_SITE_OTHER): Payer: PRIVATE HEALTH INSURANCE | Admitting: Family Medicine

## 2022-03-02 VITALS — BP 128/74 | HR 77 | Temp 97.1°F | Ht 72.0 in | Wt 235.0 lb

## 2022-03-02 DIAGNOSIS — E782 Mixed hyperlipidemia: Secondary | ICD-10-CM | POA: Diagnosis not present

## 2022-03-02 DIAGNOSIS — G4733 Obstructive sleep apnea (adult) (pediatric): Secondary | ICD-10-CM

## 2022-03-02 DIAGNOSIS — I10 Essential (primary) hypertension: Secondary | ICD-10-CM

## 2022-03-02 DIAGNOSIS — R7303 Prediabetes: Secondary | ICD-10-CM | POA: Diagnosis not present

## 2022-03-02 NOTE — Progress Notes (Signed)
[facility] [address] [place] [address] Dept: [phone] Dept Fax: [phone]  Chronic Care Office Visit  Subjective:    Patient ID: Jessica Huff, female    DOB: 12-Aug-1960, 62 y.o..   MRN: [id number]  Chief Complaint  Patient presents with   Follow-up    6 week f/u. No concerns.     History of Present Illness:  Patient is in today for reassessment of chronic medical issues.  Ms. Drouillard has a history of high blood pressure. She is currently on Hyzaar 100-25 mg daily.  ms. Baumgart has a history of hyperlipidemia. She is managed on rosuvastatin 40 mg daily and ezetimibe 10 mg daily.  Ms. Stickley has a history of sleep apnea. She notes her current CPAP unit is 62 years old. She has concerns about its functioning. She is also having issues with her headgear fitting appropriately.   Ms. Whiting has a history of prediabetes.  Past Medical History: Patient Active Problem List   Diagnosis Date Noted   Total knee replacement status, right 06/25/2021   Acute pain of right knee 02/07/2021   Malaise and fatigue 02/07/2021   Prediabetes 10/19/2018   S/P lumbar fusion 09/16/2018   Asthma in adult, moderate persistent, uncomplicated 07/03/2016   Chronic pain of right ankle 03/02/2016   Allergic rhinitis 11/29/2015   Class 1 obesity due to excess calories with serious comorbidity and body mass index (BMI) of 32.0 to 32.9 in adult 11/29/2015   Benign positional vertigo 04/07/2015   Chronic bilateral low back pain with sciatica 04/07/2015   Common migraine 04/07/2015   Hyperlipidemia 04/07/2015   Essential hypertension 04/07/2015   Insomnia 04/07/2015   Obstructive sleep apnea 04/07/2015   Lumbar radiculopathy 04/07/2015   Anxiety 04/07/2015   Past Surgical History:  Procedure Laterality Date   ANKLE SURGERY Right 2018   LUMBAR FUSION  2020   L4-L5   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   Procedure: RIGHT  TOTAL KNEE ARTHROPLASTY;  Surgeon: Nadara Mustard, MD;  Location: [facility];  Service: Orthopedics;  Laterality: Right;   Family History  Problem Relation Age of Onset   Diabetes Mother    Kidney disease Mother    Cancer Father        Kidney   Outpatient Medications Prior to Visit  Medication Sig Dispense Refill   albuterol (VENTOLIN HFA) 108 (90 Base) MCG/ACT inhaler INHALE 2 PUFFS INTO THE LUNGS EVERY 6 HOURS AS NEEDED 6.7 g 6   amitriptyline (ELAVIL) 25 MG tablet Take 1 tablet (25 mg total) by mouth at bedtime. 30 tablet 4   ezetimibe (ZETIA) 10 MG tablet TAKE 1 TABLET(10 MG) BY MOUTH DAILY 90 tablet 3   fluticasone (FLONASE) 50 MCG/ACT nasal spray SHAKE LIQUID AND USE 2 SPRAYS IN EACH NOSTRIL DAILY 16 g 6   fluticasone (FLOVENT HFA) 220 MCG/ACT inhaler Inhale 1 puff into the lungs in the morning and at bedtime. 1 each 12   levocetirizine (XYZAL) 5 MG tablet Take 5 mg by mouth daily as needed for allergies.     losartan-hydrochlorothiazide (HYZAAR) 100-25 MG tablet TAKE 1 TABLET BY MOUTH DAILY 90 tablet 3   montelukast (SINGULAIR) 10 MG tablet Take 1 tablet (10 mg total) by mouth at bedtime. 30 tablet 1   Multiple Vitamin (MULTI-VITAMIN) tablet Take 1 tablet by mouth daily.     oxyCODONE-acetaminophen (PERCOCET/ROXICET) 5-325 MG tablet Take 1 tablet by mouth every 8 (eight) hours as needed. 30 tablet 0  pregabalin (LYRICA) 100 MG capsule TAKE 1 CAPSULE BY MOUTH DAILY DAY OR NIGHT FOR 1 WEEK, INCREASE TO 100 MG TWICE DAILY FOR NERVE PAIN 332 capsule 1   rosuvastatin (CRESTOR) 40 MG tablet TAKE 1 TABLET(40 MG) BY MOUTH DAILY 90 tablet 3   No facility-administered medications prior to visit.   No Known Allergies    Objective:   Today's Vitals   03/02/22 1424  BP: 128/74  Pulse: 77  Temp: (!) 97.1 F (36.2 C)  TempSrc: Temporal  SpO2: 98%  Weight: 235 lb (106.6 kg)  Height: 6' (1.829 m)   Body mass index is 31.87 kg/m.   General: Well developed, well nourished. No acute  distress. Psych: Alert and oriented. Normal mood and affect.  Health Maintenance Due  Topic Date Due   HIV Screening  Never done   Hepatitis C Screening  Never done   DTaP/Tdap/Td (2 - Td or Tdap) 01/11/2022     Assessment & Plan:   1. Essential hypertension Blood pressure is at goal. I will continue losartan-HCTZ 100-25 mg daily. I will check her renal function today.  - Basic metabolic panel; Future  2. Mixed hyperlipidemia Due for follow-up lipids. Continue rosuvastatin 40 mg daily and ezetimibe 10 mg daily.  - Lipid panel; Future  3. Obstructive sleep apnea I will write a prescription for a new CPAP unit and supplies.  - For home use only DME continuous positive airway pressure (CPAP)  4. Prediabetes We will do an annual check of Ms. Kober's blodo sugar and A1c to monitor for diabetes.  - Basic metabolic panel; Future - Hemoglobin A1c; Future   Return in about 3 months (around 06/01/2022) for Reassessment.   Haydee Salter, MD

## 2022-03-03 ENCOUNTER — Other Ambulatory Visit (INDEPENDENT_AMBULATORY_CARE_PROVIDER_SITE_OTHER): Payer: PRIVATE HEALTH INSURANCE

## 2022-03-03 DIAGNOSIS — E782 Mixed hyperlipidemia: Secondary | ICD-10-CM

## 2022-03-03 DIAGNOSIS — R7303 Prediabetes: Secondary | ICD-10-CM

## 2022-03-03 DIAGNOSIS — I10 Essential (primary) hypertension: Secondary | ICD-10-CM

## 2022-03-03 LAB — BASIC METABOLIC PANEL
BUN: 18 mg/dL (ref 6–23)
CO2: 29 mEq/L (ref 19–32)
Calcium: 9.3 mg/dL (ref 8.4–10.5)
Chloride: 103 mEq/L (ref 96–112)
Creatinine, Ser: 0.79 mg/dL (ref 0.40–1.20)
GFR: 80.49 mL/min (ref >60.00)
Glucose, Bld: 106 mg/dL — ABNORMAL HIGH (ref 70–99)
Potassium: 4.1 mEq/L (ref 3.5–5.1)
Sodium: 141 mEq/L (ref 135–145)

## 2022-03-03 LAB — LIPID PANEL
Cholesterol: 136 mg/dL (ref 0–200)
HDL: 33.9 mg/dL — ABNORMAL LOW (ref >39.00)
LDL Cholesterol: 76 mg/dL (ref 0–99)
NonHDL: 101.84
Total CHOL/HDL Ratio: 4
Triglycerides: 131 mg/dL (ref 0.0–149.0)
VLDL: 26.2 mg/dL (ref 0.0–40.0)

## 2022-03-03 LAB — HEMOGLOBIN A1C: Hgb A1c MFr Bld: 6.1 % (ref 4.6–6.5)

## 2022-04-08 ENCOUNTER — Encounter: Payer: Self-pay | Admitting: Physical Medicine and Rehabilitation

## 2022-04-08 ENCOUNTER — Encounter
Payer: PRIVATE HEALTH INSURANCE | Attending: Physical Medicine and Rehabilitation | Admitting: Physical Medicine and Rehabilitation

## 2022-04-08 VITALS — BP 131/72 | HR 94 | Ht 72.0 in | Wt 235.0 lb

## 2022-04-08 DIAGNOSIS — M7918 Myalgia, other site: Secondary | ICD-10-CM | POA: Insufficient documentation

## 2022-04-08 DIAGNOSIS — M5416 Radiculopathy, lumbar region: Secondary | ICD-10-CM | POA: Diagnosis present

## 2022-04-08 DIAGNOSIS — M5441 Lumbago with sciatica, right side: Secondary | ICD-10-CM | POA: Diagnosis present

## 2022-04-08 DIAGNOSIS — Z96651 Presence of right artificial knee joint: Secondary | ICD-10-CM | POA: Insufficient documentation

## 2022-04-08 DIAGNOSIS — Z981 Arthrodesis status: Secondary | ICD-10-CM | POA: Insufficient documentation

## 2022-04-08 DIAGNOSIS — G8929 Other chronic pain: Secondary | ICD-10-CM | POA: Diagnosis present

## 2022-04-08 MED ORDER — PREGABALIN 100 MG PO CAPS: 100.0000 mg | ORAL_CAPSULE | Freq: Three times a day (TID) | ORAL | 1 refills | Status: DC

## 2022-04-08 NOTE — Progress Notes (Signed)
Subjective:    Patient ID: Jessica Huff, female    DOB: [birth year], 62 y.o.   MRN: [id number]  HPI  Pt is a 62 yr old female with hx of asthma, prediabetes; HTN, BMI of 32; and chronic low back pain with sciatica as well as hx of lumbar fusion- L4/5 had surgery for back pain. Here for evaluation of back pain s/p L4/5 back fusion and new RLE weakness.  Here for f/u on back pain/RLE weakness.    Still taking Lyrica Not working "that well".  Rating pain 3/10 today  Hurts more at night.   Did therapy for TKR- now goes to gym [place] 1 week ago in bathtub- on Thursday.  RLE still weak- and so caused her to fall- loss balance and fell backwards.  Hit head per pt.  Sciatica on R side.  Goes down to the knee.    Hasn't had any side effects from Lyrica-  Can tell when it rains- really hurts.   No ESI since surgery; L4/5 fusion was 2019.  R TKR ~ 07/24/21   When goes to gym- doing bicycle- 10-15 minutes Does leg press- 10-15 lbs Knee strengthening-flexion and extension-  does ~ 15 lbs.  Crunches machine- does as well Tries treadmill- but cannot walk on it long- ~ 10 minutes max.     R shoulder bothering her- couple months- occurs in R upper traps and radiates down her R shoulder.     Pain Inventory Average Pain 3 Pain Right Now 3 My pain is tingling and aching  In the last 24 hours, has pain interfered with the following? General activity 4 Relation with others 4 Enjoyment of life 5 What TIME of day is your pain at its worst? morning  and night Sleep (in general) Fair  Pain is worse with: walking, bending, and standing Pain improves with: rest, heat/ice, and therapy/exercise Relief from Meds: 1  Family History  Problem Relation Age of Onset   Diabetes Mother    Kidney disease Mother    Cancer Father        Kidney   Social History   Socioeconomic History   Marital status: Divorced    Spouse name: Not on file   Number of children: 2   Years of education: Not on  file   Highest education level: Not on file  Occupational History   Not on file  Tobacco Use   Smoking status: Never   Smokeless tobacco: Never  Vaping Use   Vaping Use: Never used  Substance and Sexual Activity   Alcohol use: Yes    Comment: socially   Drug use: Never   Sexual activity: Yes  Other Topics Concern   Not on file  Social History Narrative   Not on file   Social Determinants of Health   Financial Resource Strain: Not on file  Food Insecurity: Not on file  Transportation Needs: Not on file  Physical Activity: Not on file  Stress: Not on file  Social Connections: Not on file   Past Surgical History:  Procedure Laterality Date   ANKLE SURGERY Right 2018   LUMBAR FUSION  2020   L4-L5   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   Procedure: RIGHT TOTAL KNEE ARTHROPLASTY;  Surgeon: Newt Minion, MD;  Location: [facility];  Service: Orthopedics;  Laterality: Right;   Past Surgical History:  Procedure Laterality Date   ANKLE SURGERY Right 2018   LUMBAR FUSION  2020   L4-L5   TOTAL  KNEE ARTHROPLASTY Right 06/25/2021   Procedure: RIGHT TOTAL KNEE ARTHROPLASTY;  Surgeon: Newt Minion, MD;  Location: [facility];  Service: Orthopedics;  Laterality: Right;   Past Medical History:  Diagnosis Date   Asthma    GERD (gastroesophageal reflux disease)    Hyperlipidemia    Hypertension    There were no vitals taken for this visit.  Opioid Risk Score:   Fall Risk Score:  `1  Depression screen PHQ 2/9     08/22/2021    1:33 PM 04/25/2021   12:57 PM 02/14/2021   10:59 AM 02/10/2021    2:56 PM 11/14/2020   11:03 AM 07/16/2020    3:05 PM  Depression screen PHQ 2/9  Decreased Interest 0 0 0 1 0 0  Down, Depressed, Hopeless 0 0 0 0 0 0  PHQ - 2 Score 0 0 0 1 0 0  Altered sleeping    2    Tired, decreased energy    3    Change in appetite    0    Feeling bad or failure about yourself     0    Trouble concentrating    0    Moving slowly or fidgety/restless    3    Suicidal  thoughts    0    PHQ-9 Score    9       Review of Systems  Musculoskeletal:  Positive for back pain.       LT leg/knee pain  All other systems reviewed and are negative.      Objective:   Physical Exam  Awake, alert, appropriate, using cane to walk, NAD Slightly antalgic gait, but just slightly favors RLE  MS: LLE 5/5 in HF/KE/KF DF and PF RLE- HF 4-/5; KE/KF 5-/5; and DF 4 to 4+/5 and PF 5-/5  SLR (+) on RLE Increased pain with Lumbar extension- causing pain to run down RLE. No increased pain with flexion.   Large trigger point in R upper traps and supraspinatus.      Assessment & Plan:   Pt is a 62 yr old female with hx of asthma, prediabetes; HTN, BMI of 32; and chronic low back pain with sciatica as well as hx of lumbar fusion- L4/5 had surgery for back pain. Here for evaluation of back pain s/p L4/5 back fusion and new RLE weakness.  Here for f/u on back pain/RLE weakness.     Try Exercise ball- knees to be at 90 degrees- blow ball up til knees at knee degrees- start 30 minutes/day- with goal 60 minutes/day.  - use the exercise ball for dining room chair or couch - need to do DAILY.   2. Add abdominals and back exercises to exercise at gym.    3. Increase Lyrica to 100 mg in Am and 200 mg nightly- x 1 week; then 200 mg 2x/day- for nerve pain.   4. Will refer to Dr Letta Pate for Epidural Steroid injection-R L5/S1 for R S1 nerve root.   5.  Get The Muscle Hook-  can get at [employer], maybe [employer]-  $ 30- hold pressure, not massage 2-4 minutes on that muscle/tight areas.    6. F/U 3 months- if you call me prior to next appointment- can do Trigger point injections at next appt.     I spent a total of  31  minutes on total care today- >50% coordination of care- due to education on myofascial pain/trigger points; and education about ESI

## 2022-04-08 NOTE — Patient Instructions (Signed)
Pt is a 62 yr old female with hx of asthma, prediabetes; HTN, BMI of 32; and chronic low back pain with sciatica as well as hx of lumbar fusion- L4/5 had surgery for back pain. Here for evaluation of back pain s/p L4/5 back fusion and new RLE weakness.  Here for f/u on back pain/RLE weakness.     Try Exercise ball- knees to be at 90 degrees- blow ball up til knees at knee degrees- start 30 minutes/day- with goal 60 minutes/day.  - use the exercise ball for dining room chair or couch - need to do DAILY.   2. Add abdominals and back exercises to exercise at gym.    3. Increase Lyrica to 100 mg in Am and 200 mg nightly- x 1 week; then 200 mg 2x/day- for nerve pain.   4. Will refer to Dr Letta Pate for Epidural Steroid injection-R L5/S1 for R S1 nerve root.   5.  Get The Muscle Hook-  can get at [employer], maybe [employer]-  $ 30- hold pressure, not massage 2-4 minutes on that muscle/tight areas.    6. F/U 3 months- if you call me prior to next appointment- can do Trigger point injections at next appt.

## 2022-04-29 ENCOUNTER — Other Ambulatory Visit: Payer: Self-pay | Admitting: Family Medicine

## 2022-04-29 DIAGNOSIS — E785 Hyperlipidemia, unspecified: Secondary | ICD-10-CM

## 2022-04-29 DIAGNOSIS — I10 Essential (primary) hypertension: Secondary | ICD-10-CM

## 2022-05-06 ENCOUNTER — Telehealth: Payer: Self-pay

## 2022-05-07 ENCOUNTER — Encounter: Payer: Self-pay | Admitting: Physical Medicine & Rehabilitation

## 2022-05-07 ENCOUNTER — Encounter: Payer: Medicaid Other | Attending: Physical Medicine and Rehabilitation | Admitting: Physical Medicine & Rehabilitation

## 2022-05-07 VITALS — BP 116/82 | HR 91 | Ht 72.0 in | Wt 230.0 lb

## 2022-05-07 DIAGNOSIS — M5416 Radiculopathy, lumbar region: Secondary | ICD-10-CM | POA: Insufficient documentation

## 2022-05-07 NOTE — Progress Notes (Signed)
Subjective:    Patient ID: Jessica Huff, female    DOB: 05/30/1960, 62 y.o.   MRN: [id number]  HPI  The patient was scheduled for an epidural today however she does not have a driver.  She will need to reschedule when she has a driver available   pain Inventory Average Pain 3 Pain Right Now 3 My pain is tingling and aching  In the last 24 hours, has pain interfered with the following? General activity 2 Relation with others 1 Enjoyment of life 0 What TIME of day is your pain at its worst? night Sleep (in general) Fair  Pain is worse with: walking, bending, sitting, and inactivity Pain improves with: rest, heat/ice, and therapy/exercise Relief from Meds:  .  Family History  Problem Relation Age of Onset   Diabetes Mother    Kidney disease Mother    Cancer Father        Kidney   Social History   Socioeconomic History   Marital status: Divorced    Spouse name: Not on file   Number of children: 2   Years of education: Not on file   Highest education level: Not on file  Occupational History   Not on file  Tobacco Use   Smoking status: Never   Smokeless tobacco: Never  Vaping Use   Vaping Use: Never used  Substance and Sexual Activity   Alcohol use: Yes    Comment: socially   Drug use: Never   Sexual activity: Yes  Other Topics Concern   Not on file  Social History Narrative   Not on file   Social Determinants of Health   Financial Resource Strain: Not on file  Food Insecurity: Not on file  Transportation Needs: Not on file  Physical Activity: Not on file  Stress: Not on file  Social Connections: Not on file   Past Surgical History:  Procedure Laterality Date   ANKLE SURGERY Right 2018   LUMBAR FUSION  2020   L4-L5   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   Procedure: RIGHT TOTAL KNEE ARTHROPLASTY;  Surgeon: Newt Minion, MD;  Location: [facility];  Service: Orthopedics;  Laterality: Right;   Past Surgical History:  Procedure Laterality Date    ANKLE SURGERY Right 2018   LUMBAR FUSION  2020   L4-L5   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   Procedure: RIGHT TOTAL KNEE ARTHROPLASTY;  Surgeon: Newt Minion, MD;  Location: [facility];  Service: Orthopedics;  Laterality: Right;   Past Medical History:  Diagnosis Date   Asthma    GERD (gastroesophageal reflux disease)    Hyperlipidemia    Hypertension    BP 116/82   Pulse 91   Ht 6' (1.829 m)   Wt 230 lb (104.3 kg)   SpO2 98%   BMI 31.19 kg/m   Opioid Risk Score:   Fall Risk Score:  `1  Depression screen [facility] 2/9     05/07/2022   11:20 AM 04/08/2022   11:10 AM 08/22/2021    1:33 PM 04/25/2021   12:57 PM 02/14/2021   10:59 AM 02/10/2021    2:56 PM 11/14/2020   11:03 AM  Depression screen PHQ 2/9  Decreased Interest 0 1 0 0 0 1 0  Down, Depressed, Hopeless 0 0 0 0 0 0 0  PHQ - 2 Score 0 1 0 0 0 1 0  Altered sleeping      2   Tired, decreased energy  3   Change in appetite      0   Feeling bad or failure about yourself       0   Trouble concentrating      0   Moving slowly or fidgety/restless      3   Suicidal thoughts      0   PHQ-9 Score      9       Review of Systems  Musculoskeletal:  Positive for back pain and gait problem.  All other systems reviewed and are negative.     Objective:   Physical Exam        Assessment & Plan:

## 2022-05-08 NOTE — Telephone Encounter (Signed)
Auth approved.

## 2022-06-01 ENCOUNTER — Encounter: Payer: Self-pay | Admitting: Family Medicine

## 2022-06-01 ENCOUNTER — Ambulatory Visit (INDEPENDENT_AMBULATORY_CARE_PROVIDER_SITE_OTHER): Payer: Medicaid Other | Admitting: Family Medicine

## 2022-06-01 VITALS — BP 124/76 | HR 88 | Temp 98.6°F | Ht 72.0 in | Wt 232.8 lb

## 2022-06-01 DIAGNOSIS — J4540 Moderate persistent asthma, uncomplicated: Secondary | ICD-10-CM | POA: Diagnosis not present

## 2022-06-01 DIAGNOSIS — Z113 Encounter for screening for infections with a predominantly sexual mode of transmission: Secondary | ICD-10-CM | POA: Diagnosis not present

## 2022-06-01 DIAGNOSIS — Z86018 Personal history of other benign neoplasm: Secondary | ICD-10-CM | POA: Diagnosis not present

## 2022-06-01 DIAGNOSIS — F418 Other specified anxiety disorders: Secondary | ICD-10-CM

## 2022-06-01 MED ORDER — FLUTICASONE FUROATE 200 MCG/ACT IN AEPB: 1.0000 | INHALATION_SPRAY | Freq: Every day | RESPIRATORY_TRACT | 11 refills | Status: DC

## 2022-06-01 MED ORDER — ALPRAZOLAM 0.5 MG PO TABS: ORAL_TABLET | ORAL | 0 refills | Status: AC

## 2022-06-01 NOTE — Assessment & Plan Note (Signed)
I will check an ultrasound to determine if the uterine fibroid could be of a size to compress the bladder.

## 2022-06-01 NOTE — Assessment & Plan Note (Signed)
I will switch Jessica Huff to fluticasone AEPB. I pointed out the importance of her use of an inhaled steroid to control her asthma.

## 2022-06-01 NOTE — Progress Notes (Signed)
[facility] [address] [place] [address] Dept: [phone] Dept Fax: [phone]  Chronic Care Office Visit  Subjective:    Patient ID: Jessica Huff, female    DOB: 1960/05/23, 62 y.o..   MRN: [id number]  Chief Complaint  Patient presents with   Medical Management of Chronic Issues    3 month f/u.  Wants STD testing    History of Present Illness:  Patient is in today for reassessment of chronic medical issues.  Jessica Huff has a history of hypertension. She is currently on Hyzaar 100-25 mg daily.   Jessica Huff has a history of hyperlipidemia. She is managed on rosuvastatin 40 mg daily and ezetimibe 10 mg daily.   Jessica Huff  notes she has a new sexual partner. She requests STD testing.  Jessica Huff notes she has a history of a uterine fibroid. She wonders if this may be contributing to sensations she has of urinary urgency. She notes she often feels like she needs to void, but only a small amount of urine is produced. She is postmenopausal and has no sign of uterine bleeding.  Jessica Huff has an upcoming flight to [facility]. She is nervous about flying and asks about being able to take something to help her relax on the plane.  Jessica Huff has a history of moderate persistent asthma. She has run into an issue with her insurance not covering fluticasone HFA inhaler. She has been using her albuterol inhaler instead.   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   Acute pain of right knee 02/07/2021   Malaise and fatigue 02/07/2021   Prediabetes 10/19/2018   S/P lumbar fusion 09/16/2018   Asthma in adult, moderate persistent, uncomplicated 07/03/2016   Chronic pain of right ankle 03/02/2016   Allergic rhinitis 11/29/2015   Class 1  obesity due to excess calories with serious comorbidity and body mass index (BMI) of 32.0 to 32.9 in adult 11/29/2015   Benign positional vertigo 04/07/2015   Chronic bilateral low back pain with sciatica 04/07/2015   Common migraine 04/07/2015   Hyperlipidemia 04/07/2015   Essential hypertension 04/07/2015   Insomnia 04/07/2015   Obstructive sleep apnea 04/07/2015   Lumbar radiculopathy 04/07/2015   Anxiety 04/07/2015   Past Surgical History:  Procedure Laterality Date   ANKLE SURGERY Right 2018   LUMBAR FUSION  2020   L4-L5   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   Procedure: RIGHT TOTAL KNEE ARTHROPLASTY;  Surgeon: Nadara Mustard, MD;  Location: [facility] OR;  Service: Orthopedics;  Laterality: Right;   Family History  Problem Relation Age of Onset   Diabetes Mother    Kidney disease Mother    Cancer Father        Kidney   Outpatient Medications Prior to Visit  Medication Sig Dispense Refill   albuterol (VENTOLIN HFA) 108 (90 Base) MCG/ACT inhaler INHALE 2 PUFFS INTO THE LUNGS EVERY 6 HOURS AS NEEDED 6.7 g 6   amitriptyline (ELAVIL) 25 MG tablet Take 1 tablet (25 mg total) by mouth at bedtime. 30 tablet 4   diazepam (VALIUM) 10 MG tablet SMARTSIG:1 Tablet(s) By Mouth     ezetimibe (ZETIA) 10 MG tablet TAKE 1 TABLET(10 MG) BY MOUTH DAILY 90 tablet 3   fluticasone (FLONASE) 50 MCG/ACT nasal spray SHAKE LIQUID AND  USE 2 SPRAYS IN EACH NOSTRIL DAILY 16 g 6   levocetirizine (XYZAL) 5 MG tablet Take 5 mg by mouth daily as needed for allergies.     losartan-hydrochlorothiazide (HYZAAR) 100-25 MG tablet TAKE 1 TABLET BY MOUTH DAILY 90 tablet 3   montelukast (SINGULAIR) 10 MG tablet Take 1 tablet (10 mg total) by mouth at bedtime. 30 tablet 1   Multiple Vitamin (MULTI-VITAMIN) tablet Take 1 tablet by mouth daily.     pregabalin (LYRICA) 100 MG capsule Take 1 capsule (100 mg total) by mouth 3 (three) times daily. X 1 week, then increase to 2 tabs  2x/day- for nerve pain 360 capsule 1    rosuvastatin (CRESTOR) 40 MG tablet TAKE 1 TABLET(40 MG) BY MOUTH DAILY 90 tablet 3   fluticasone (FLOVENT HFA) 220 MCG/ACT inhaler Inhale 1 puff into the lungs in the morning and at bedtime. (Patient not taking: Reported on 06/01/2022) 1 each 12   No facility-administered medications prior to visit.   No Known Allergies Objective:   Today's Vitals   06/01/22 1348  BP: 124/76  Pulse: 88  Temp: 98.6 F (37 C)  TempSrc: Temporal  SpO2: 97%  Weight: 232 lb 12.8 oz (105.6 kg)  Height: 6' (1.829 m)   Body mass index is 31.57 kg/m.   General: Well developed, well nourished. No acute distress. Lungs: Clear to auscultation bilaterally. No wheezing, rales, or rhonchi. Psych: Alert and oriented. Normal mood and affect.  Health Maintenance Due  Topic Date Due   HIV Screening  Never done   Hepatitis C Screening  Never done   DTaP/Tdap/Td (2 - Td or Tdap) 01/11/2022     Assessment & Plan:   Problem List Items Addressed This Visit       Respiratory   Asthma in adult, moderate persistent, uncomplicated - Primary    I will switch Jessica Huff to fluticasone AEPB. I pointed out the importance of her use of an inhaled steroid to control her asthma.      Relevant Medications   Fluticasone Furoate 200 MCG/ACT AEPB     Other   History of uterine fibroid    I will check an ultrasound to determine if the uterine fibroid could be of a size to compress the bladder.      Relevant Orders   US Pelvis Complete   Other Visit Diagnoses     Screening for STD (sexually transmitted disease)       Recommend condom use with a new partner.   Relevant Orders   Chlamydia/GC NAA, Confirmation   HIV Antibody (routine testing w rflx)   RPR   Situational anxiety       I will prescribe alprazolam for use priro to her flights.   Relevant Medications   diazepam (VALIUM) 10 MG tablet   ALPRAZolam (XANAX) 0.5 MG tablet       Return in about 3 months (around 08/31/2022) for Reassessment.   Loyola Mast, MD

## 2022-06-02 ENCOUNTER — Encounter: Payer: Self-pay | Admitting: Physical Medicine & Rehabilitation

## 2022-06-02 ENCOUNTER — Encounter: Payer: Medicaid Other | Attending: Physical Medicine and Rehabilitation | Admitting: Physical Medicine & Rehabilitation

## 2022-06-02 VITALS — Ht 72.0 in | Wt 230.0 lb

## 2022-06-02 DIAGNOSIS — R1909 Other intra-abdominal and pelvic swelling, mass and lump: Secondary | ICD-10-CM | POA: Insufficient documentation

## 2022-06-02 DIAGNOSIS — M5416 Radiculopathy, lumbar region: Secondary | ICD-10-CM

## 2022-06-02 LAB — HIV ANTIBODY (ROUTINE TESTING W REFLEX): HIV 1&2 Ab, 4th Generation: NONREACTIVE

## 2022-06-02 LAB — RPR: RPR Ser Ql: NONREACTIVE

## 2022-06-02 MED ORDER — LIDOCAINE HCL 1 % IJ SOLN
5.0000 mL | Freq: Once | INTRAMUSCULAR | Status: AC
  Administered 2022-06-02: 5 mL

## 2022-06-02 MED ORDER — LIDOCAINE HCL (PF) 1 % IJ SOLN
2.0000 mL | Freq: Once | INTRAMUSCULAR | Status: AC
  Administered 2022-06-02: 2 mL

## 2022-06-02 MED ORDER — DEXAMETHASONE SODIUM PHOSPHATE 10 MG/ML IJ SOLN
10.0000 mg | Freq: Once | INTRAMUSCULAR | Status: AC
  Administered 2022-06-02: 10 mg via INTRAVENOUS

## 2022-06-02 MED ORDER — IOHEXOL 180 MG/ML  SOLN
3.0000 mL | Freq: Once | INTRAMUSCULAR | Status: AC
  Administered 2022-06-02: 3 mL via INTRAVENOUS

## 2022-06-02 NOTE — Progress Notes (Signed)
Right L5-S1 Lumbar transforaminal epidural steroid injection under fluoroscopic guidance with contrast enhancement  Indication: Lumbosacral radiculitis is not relieved by medication management or other conservative care and interfering with self-care and mobility.   Informed consent was obtained after describing risk and benefits of the procedure with the patient, this includes bleeding, bruising, infection, paralysis and medication side effects.  The patient wishes to proceed and has given written consent.  Patient was placed in prone position.  The lumbar area was marked and prepped with Betadine.  It was entered with a 25-gauge 1-1/2 inch needle and one mL of 1% lidocaine was injected into the skin and subcutaneous tissue.  Then a 22-gauge 5in spinal needle was inserted into the RIght L5-S1 intervertebral foramen under AP, lateral, and oblique view.  Once needle tip was within the foramen on lateral views an dnor exceeding 6 o clock position on th epedical on AP viewed Isovue 200 was inected x 56ml Then a solution containing one mL of 10 mg per mL dexamethasone and 2 mL of 1% lidocaine was injected.  The patient tolerated procedure well.  Post procedure instructions were given.  Please see post procedure form.   Incidental finding of a round calcified mass overlying the left side of mid sacrum , pt denies any recent barium studies, will order Abd 2 V to look for intra abdominal vs bladder calcification.  May need abd/pelvis US Xray of Lumbar had this same mass in 2022 Felt to be a calcified fibroid Have notified referring MD and PCP

## 2022-06-02 NOTE — Progress Notes (Signed)
  PROCEDURE RECORD [facility]   Name: Katianne Fencl DOB:Jun 19, 1960 MRN: [id number]  Date:06/02/2022  Physician: Claudette Laws, MD    Nurse/CMA: Karlina Suares S  Allergies: No Known Allergies  Consent Signed: Yes.    Is patient diabetic? No.  CBG today? N/a  Pregnant: No. LMP: No LMP recorded. Patient is postmenopausal. (age [age])  Anticoagulants: no Anti-inflammatory: no Antibiotics: no  Procedure: Right epidural steroid injection  Position: Prone Start Time: 3:21  End Time: 3:25  Fluoro Time: 38  RN/CMA Era Parr S Atul Delucia S    Time 3:00 3:28    BP 125/77 124/79    Pulse 78 78    Respirations 16 16    O2 Sat 94 96    S/S 6 6    Pain Level 4/10 2/10     D/C home with sister, patient A & O X 3, D/C instructions reviewed, and sits independently.

## 2022-06-03 LAB — CHLAMYDIA/GC NAA, CONFIRMATION
Chlamydia trachomatis, NAA: NEGATIVE
Neisseria gonorrhoeae, NAA: NEGATIVE

## 2022-06-04 ENCOUNTER — Ambulatory Visit (HOSPITAL_BASED_OUTPATIENT_CLINIC_OR_DEPARTMENT_OTHER)
Admission: RE | Admit: 2022-06-04 | Discharge: 2022-06-04 | Disposition: A | Discharge status: Home / Self Care | Payer: Medicaid Other | Source: Ambulatory Visit | Attending: Physical Medicine & Rehabilitation | Admitting: Physical Medicine & Rehabilitation

## 2022-06-04 DIAGNOSIS — R1909 Other intra-abdominal and pelvic swelling, mass and lump: Secondary | ICD-10-CM | POA: Insufficient documentation

## 2022-06-04 DIAGNOSIS — D259 Leiomyoma of uterus, unspecified: Secondary | ICD-10-CM | POA: Diagnosis not present

## 2022-06-15 ENCOUNTER — Ambulatory Visit (HOSPITAL_BASED_OUTPATIENT_CLINIC_OR_DEPARTMENT_OTHER)
Admission: RE | Admit: 2022-06-15 | Discharge: 2022-06-15 | Disposition: A | Discharge status: Home / Self Care | Payer: Medicaid Other | Source: Ambulatory Visit | Attending: Family Medicine | Admitting: Family Medicine

## 2022-06-15 DIAGNOSIS — Z86018 Personal history of other benign neoplasm: Secondary | ICD-10-CM | POA: Insufficient documentation

## 2022-06-15 DIAGNOSIS — D259 Leiomyoma of uterus, unspecified: Secondary | ICD-10-CM | POA: Diagnosis not present

## 2022-06-22 ENCOUNTER — Ambulatory Visit (INDEPENDENT_AMBULATORY_CARE_PROVIDER_SITE_OTHER): Payer: Medicaid Other

## 2022-06-22 ENCOUNTER — Ambulatory Visit: Payer: Medicaid Other | Admitting: Podiatry

## 2022-06-22 DIAGNOSIS — M21611 Bunion of right foot: Secondary | ICD-10-CM | POA: Diagnosis not present

## 2022-06-22 DIAGNOSIS — M21612 Bunion of left foot: Secondary | ICD-10-CM | POA: Diagnosis not present

## 2022-06-22 NOTE — Progress Notes (Signed)
   Chief Complaint  Patient presents with   Bunions    Patient came in today for bilateral bunions, started over 20 years ago, patient denies any pain, patient states she wears a shoe a size larger, X-Rays done today     Subjective: 62 y.o. female presents today as a new patient for evaluation of minimally symptomatic bunions to the bilateral feet.  Patient states that she has had bunions for several years.  They are not necessarily painful but occasionally they can be irritated in her shoes depending on the shoes that she wears.  She presents today to discuss possible correction of the bunions.  Past Medical History:  Diagnosis Date   Asthma    GERD (gastroesophageal reflux disease)    Hyperlipidemia    Hypertension     Past Surgical History:  Procedure Laterality Date   ANKLE SURGERY Right 2018   LUMBAR FUSION  2020   L4-L5   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   Procedure: RIGHT TOTAL KNEE ARTHROPLASTY;  Surgeon: Nadara Mustard, MD;  Location: [facility] OR;  Service: Orthopedics;  Laterality: Right;    No Known Allergies   Objective: Physical Exam General: The patient is alert and oriented x3 in no acute distress.  Dermatology: Skin is cool, dry and supple bilateral lower extremities. Negative for open lesions or macerations.  Vascular: Palpable pedal pulses bilaterally. No edema or erythema noted. Capillary refill within normal limits.  Neurological: Epicritic and protective threshold grossly intact bilaterally.   Musculoskeletal Exam: Clinical evidence of bunion deformity noted to the respective foot. There is moderate pain on palpation range of motion of the first MPJ. Lateral deviation of the hallux noted consistent with hallux abductovalgus.  Radiographic Exam B/L feet 06/22/2022: Normal osseous mineralization.  No acute fractures identified.  Increased intermetatarsal angle greater than 15 with a hallux abductus angle greater than 30 noted on AP view.  Increased  metatarsus adductus angle also noted with medial deviation of the metatarsals specifically the second and third  Assessment: 1.  Hallux valgus bilateral 2.  Metatarsus adductus bilateral -Patient evaluated.  X-rays reviewed -Today we discussed both conservative and surgical management of the patient's bunions.  I explained that in order to appropriately treat and surgically correct for the bunion deformity I would need to address the metatarsus adductus as well.  This would be a larger surgery and I would only recommend that if it was very symptomatic and painful on a daily basis.  Currently the patient has minimal pain or tenderness to the feet.  Only occasionally it gets irritated with certain shoes. -Recommend conservative treatment including wider shoes that do not irritate or constrict the toebox area -Return to clinic as needed   Felecia Shelling, DPM [facility]  Dr. Felecia Shelling, DPM    [address] [place], [place] [address]                Office [phone]  Fax [phone]

## 2022-06-23 ENCOUNTER — Other Ambulatory Visit: Payer: Self-pay | Admitting: Podiatry

## 2022-06-23 DIAGNOSIS — M21611 Bunion of right foot: Secondary | ICD-10-CM

## 2022-07-06 ENCOUNTER — Encounter: Payer: Self-pay | Admitting: Physical Medicine and Rehabilitation

## 2022-07-06 ENCOUNTER — Other Ambulatory Visit: Payer: Self-pay

## 2022-07-06 ENCOUNTER — Encounter
Payer: Medicaid Other | Attending: Physical Medicine and Rehabilitation | Admitting: Physical Medicine and Rehabilitation

## 2022-07-06 VITALS — BP 130/76 | HR 86 | Ht 72.0 in | Wt 238.8 lb

## 2022-07-06 DIAGNOSIS — M7918 Myalgia, other site: Secondary | ICD-10-CM

## 2022-07-06 DIAGNOSIS — M5441 Lumbago with sciatica, right side: Secondary | ICD-10-CM | POA: Diagnosis not present

## 2022-07-06 DIAGNOSIS — M5416 Radiculopathy, lumbar region: Secondary | ICD-10-CM

## 2022-07-06 DIAGNOSIS — G8929 Other chronic pain: Secondary | ICD-10-CM

## 2022-07-06 MED ORDER — LIDOCAINE HCL 1 % IJ SOLN
9.0000 mL | Freq: Once | INTRAMUSCULAR | Status: AC
Start: 2022-07-06 — End: 2022-07-06
  Administered 2022-07-06: 9 mL

## 2022-07-06 NOTE — Progress Notes (Signed)
  Pt is a 62 yr old female with hx of asthma, prediabetes; HTN, BMI of 32; and chronic low back pain with sciatica as well as hx of lumbar fusion- L4/5 had surgery for back pain. Here for evaluation of back pain s/p L4/5 back fusion and new RLE weakness.  Here for f/u on back pain/RLE weakness.    Pt had R L5/S1 Transforaminal ESI by Dr Wynn Banker on 06/02/22.  Helped for awhile, but lasted 3-4 weeks.  Has had those shots before, but "never helped".   Using exercise ball- 2x/week-  Working at at gym- added abd/back exercises- goes 2x/week.   Increased Lyrica- 200 mg BID-  Hasn't helped "so far'.  Has helped a little bit- but nothing makes it "go away".      Plan: Need to sit on exercise ball 5-7 days/week- if don't do exercises of back 5+ days/week, it's as if never did it at all.    2.  Explained will never go "away"- but our goal is to make more tolerable- not ot make it go away.    3.  Drink more water- today- at least 2 Liters.    4. Patient here for trigger point injections for  Consent done and on chart.  Cleaned areas with alcohol and injected using a 27 gauge 1.5 inch needle  Injected 9cc- none wasted Using 1% Lidocaine with no EPI  Upper traps- B/L x2 Levators B/L x2 Posterior scalenes Middle scalenes Splenius Capitus Pectoralis Major Rhomboids B/L  Infraspinatus Teres Major/minor Thoracic paraspinals Lumbar paraspinals- B/L  Other injections-      There was no bleeding - doctor got a blood stick- sent pt  for blood draw due to doctor getting stuck.   Patient was advised to drink a lot of water on day after injections to flush system Will have increased soreness for 12-48 hours after injections.  Can use Lidocaine patches the day AFTER injections Can use theracane on day of injections in places didn't inject Can use heating pad 4-6 hours AFTER injections.  5. Educated pt that muscles tight due to protecting neck/arthritis in neck, most likely.   6.  Use theracane/muscle hook  at least 3 days/week.   7. F/U in 3 months-   8. Take tennis ball on trip in car/plane.    I spent a total of  34  minutes on total care today- >50% coordination of care- due to  education on trigger point injections and stick.  6 minutes on injections rest d/w as detailed above.

## 2022-07-06 NOTE — Patient Instructions (Addendum)
  Plan: Need to sit on exercise ball 5-7 days/week- if don't do exercises of back 5+ days/week, it's as if never did it at all.    2.  Explained will never go "away"- but our goal is to make more tolerable- not ot make it go away.    3.  Drink more water- today- at least 2 Liters.    4. Patient here for trigger point injections for  Consent done and on chart.  Cleaned areas with alcohol and injected using a 27 gauge 1.5 inch needle  Injected 9cc- none wasted Using 1% Lidocaine with no EPI  Upper traps- B/L x2 Levators B/L x2 Posterior scalenes Middle scalenes Splenius Capitus Pectoralis Major Rhomboids B/L  Infraspinatus Teres Major/minor Thoracic paraspinals Lumbar paraspinals- B/L  Other injections-      There was no bleeding - doctor got a blood stick- sent pt  for blood draw due to doctor getting stuck.   Patient was advised to drink a lot of water on day after injections to flush system Will have increased soreness for 12-48 hours after injections.  Can use Lidocaine patches the day AFTER injections Can use theracane on day of injections in places didn't inject Can use heating pad 4-6 hours AFTER injections.  5. Educated pt that muscles tight due to protecting neck/arthritis in neck, most likely.   6. Use theracane/muscle hook  at least 3 days/week.   7. F/U in 3months-   8. Take tennis ball on flight and long driving trips.

## 2022-07-15 ENCOUNTER — Other Ambulatory Visit (HOSPITAL_COMMUNITY): Payer: Self-pay

## 2022-07-15 ENCOUNTER — Telehealth: Payer: Self-pay

## 2022-07-15 NOTE — Telephone Encounter (Signed)
Patient Advocate Encounter   Received notification from [facility] that prior authorization for Arnuity Ellipta is required.   PA submitted on 07/15/2022 [facility] Insurance [facility] Status is pending

## 2022-07-17 NOTE — Telephone Encounter (Signed)
Received denial asking that pt have trial/failure of budesonide (Pulmicort) nebules, pt did have these in inpatient stay, going to try to get covered based on this.  New key: [facility]

## 2022-07-18 ENCOUNTER — Other Ambulatory Visit (HOSPITAL_COMMUNITY): Payer: Self-pay

## 2022-07-18 NOTE — Telephone Encounter (Signed)
Pharmacy Patient Advocate Encounter  Prior Authorization for Arnuity Ellipta  has been approved by [employer] (ins).    PA # [id number] Effective dates: 07/17/2022 through 07/17/2023  Copay is $4.00  Melanee Spry CPhT Rx Patient Advocate [phone]

## 2022-09-06 ENCOUNTER — Other Ambulatory Visit: Payer: Self-pay | Admitting: Family Medicine

## 2022-09-06 DIAGNOSIS — J301 Allergic rhinitis due to pollen: Secondary | ICD-10-CM

## 2022-09-12 ENCOUNTER — Other Ambulatory Visit: Payer: Self-pay | Admitting: Family Medicine

## 2022-09-12 DIAGNOSIS — E785 Hyperlipidemia, unspecified: Secondary | ICD-10-CM

## 2022-09-29 DIAGNOSIS — G4733 Obstructive sleep apnea (adult) (pediatric): Secondary | ICD-10-CM | POA: Diagnosis not present

## 2022-10-07 ENCOUNTER — Encounter: Payer: Self-pay | Admitting: Physical Medicine and Rehabilitation

## 2022-10-07 ENCOUNTER — Encounter
Payer: Medicaid Other | Attending: Physical Medicine and Rehabilitation | Admitting: Physical Medicine and Rehabilitation

## 2022-10-07 VITALS — BP 128/82 | HR 84 | Ht 72.0 in | Wt 236.6 lb

## 2022-10-07 DIAGNOSIS — M7918 Myalgia, other site: Secondary | ICD-10-CM | POA: Diagnosis not present

## 2022-10-07 DIAGNOSIS — M5416 Radiculopathy, lumbar region: Secondary | ICD-10-CM | POA: Insufficient documentation

## 2022-10-07 DIAGNOSIS — Z981 Arthrodesis status: Secondary | ICD-10-CM | POA: Insufficient documentation

## 2022-10-07 MED ORDER — LIDOCAINE HCL 1 % IJ SOLN
9.0000 mL | Freq: Once | INTRAMUSCULAR | Status: AC
Start: 2022-10-07 — End: 2022-10-07
  Administered 2022-10-07: 9 mL

## 2022-10-07 MED ORDER — PREGABALIN 200 MG PO CAPS: 200.0000 mg | ORAL_CAPSULE | Freq: Two times a day (BID) | ORAL | 5 refills | Status: DC

## 2022-10-07 NOTE — Patient Instructions (Signed)
Plan:  Gave free balance screening to pt with PT.    Patient here for trigger point injections for  Consent done and on chart.  Cleaned areas with alcohol and injected using a 27 gauge 1.5 inch needle  Injected  7.5cc- wasted 1.5cc Using 1% Lidocaine with no EPI  Upper traps B/L  Levators- B/L  Posterior scalenes Middle scalenes- B/L  Splenius Capitus Pectoralis Major Rhomboids- B/L x2 Infraspinatus Teres Major/minor Thoracic paraspinals- B/L x 2 Lumbar paraspinals B/L x2 Other injections-    Patient's level of pain prior was 5/10 Current level of pain after injections is down tp 4/10.   There was no bleeding or complications.  Patient was advised to drink a lot of water on day after injections to flush system Will have increased soreness for 12-48 hours after injections.  Can use Lidocaine patches the day AFTER injections Can use theracane on day of injections in places didn't inject Can use heating pad 4-6 hours AFTER injections   3. Walks with cane-    4. Educated why doing trigger point injections- for muscle tightness related to back and neck pain.  It's a pain all on its own.    5. Con't usage massage gun and theracane as needed.  To maintain relaxation- at least 1-2x/week-   6. F/U in 3 months trigger point injections.    7. Dr Wynn Banker injections not real helpful- won't reorder.

## 2022-10-07 NOTE — Progress Notes (Signed)
Pt is a 62 yr old female with hx of asthma, prediabetes; HTN, BMI of 32; and chronic low back pain with sciatica as well as hx of lumbar fusion- L4/5 had surgery for back pain. Here for evaluation of back pain s/p L4/5 back fusion and new RLE weakness.  Here for f/u on back pain/RLE weakness. And trigger point injections.    Last Lumbar eSI R L5/S1 by Dr Wynn Banker 06/02/22.   Trigger point injections were helpful Lasted "awhile"-  Couple of months! Awesome.   Using exercise ball 3x/week- about 1-2 hours-  Helping a little bit.   "Always fall"- R leg gives out on her- occurs ~ 1 months.  Last time fell was in July- and has frequent near falls. And if doesn't catch self, is on floor. Last time fell on face- lip was bleeding- and chipped teeth in front.   Using thercane as well- uses when having pain-  2-3x/month.   Gets stiff if sits a long time.   Drinks plenty of water-  all day long-  more than 8 small cups/day.  Usually drinks 16 ox bottles-   Using massage gun- works well.   Plan:  Gave free balance screening to pt with PT.    Patient here for trigger point injections for  Consent done and on chart.  Cleaned areas with alcohol and injected using a 27 gauge 1.5 inch needle  Injected  7.5cc- wasted 1.5cc Using 1% Lidocaine with no EPI  Upper traps B/L  Levators- B/L  Posterior scalenes Middle scalenes- B/L  Splenius Capitus Pectoralis Major Rhomboids- B/L x2 Infraspinatus Teres Major/minor Thoracic paraspinals- B/L x 2 Lumbar paraspinals B/L x2 Other injections-    Patient's level of pain prior was 5/10 Current level of pain after injections is down tp 4/10.   There was no bleeding or complications.  Patient was advised to drink a lot of water on day after injections to flush system Will have increased soreness for 12-48 hours after injections.  Can use Lidocaine patches the day AFTER injections Can use theracane on day of injections in places didn't  inject Can use heating pad 4-6 hours AFTER injections   3. Walks with cane-    4. Educated why doing trigger point injections- for muscle tightness related to back and neck pain.  It's a pain all on its own.    5. Con't usage massage gun and theracane as needed.  To maintain relaxation- at least 1-2x/week-   6. F/U in 3 months trigger point injections.    7. Dr Wynn Banker injections not real helpful- won't reorder.    I spent a total of 29   minutes on total care today- >50% coordination of care- due to 8 minutes on injections- and 29 minutes on discussion of plan as detailed above.

## 2022-11-15 ENCOUNTER — Other Ambulatory Visit: Payer: Self-pay | Admitting: Family Medicine

## 2022-11-15 DIAGNOSIS — I10 Essential (primary) hypertension: Secondary | ICD-10-CM

## 2022-11-18 ENCOUNTER — Ambulatory Visit (INDEPENDENT_AMBULATORY_CARE_PROVIDER_SITE_OTHER): Payer: Medicaid Other

## 2022-11-18 DIAGNOSIS — Z23 Encounter for immunization: Secondary | ICD-10-CM

## 2022-11-18 NOTE — Progress Notes (Signed)
Pt in office for flu shot. Immunization was given in right deltoid. Pt tolerated well. All questions were asked and answer by patient.

## 2022-11-23 ENCOUNTER — Ambulatory Visit: Payer: Medicaid Other | Admitting: Family Medicine

## 2022-11-23 ENCOUNTER — Telehealth: Payer: Self-pay | Admitting: Family Medicine

## 2022-11-23 NOTE — Telephone Encounter (Signed)
11/23/22 - Pt called at 2:37pm to cancel her 3:00pm appt. She said she has got a flat tyre. She re-sch for tomor. No show letter sent.

## 2022-11-24 ENCOUNTER — Ambulatory Visit (INDEPENDENT_AMBULATORY_CARE_PROVIDER_SITE_OTHER): Payer: Medicaid Other | Admitting: Family Medicine

## 2022-11-24 ENCOUNTER — Encounter: Payer: Self-pay | Admitting: Family Medicine

## 2022-11-24 VITALS — BP 118/68 | HR 76 | Temp 98.0°F | Ht 72.0 in | Wt 232.0 lb

## 2022-11-24 DIAGNOSIS — M5441 Lumbago with sciatica, right side: Secondary | ICD-10-CM | POA: Diagnosis not present

## 2022-11-24 DIAGNOSIS — Z23 Encounter for immunization: Secondary | ICD-10-CM

## 2022-11-24 DIAGNOSIS — G8929 Other chronic pain: Secondary | ICD-10-CM | POA: Diagnosis not present

## 2022-11-24 DIAGNOSIS — Z981 Arthrodesis status: Secondary | ICD-10-CM

## 2022-11-24 DIAGNOSIS — I10 Essential (primary) hypertension: Secondary | ICD-10-CM | POA: Diagnosis not present

## 2022-11-24 DIAGNOSIS — J4540 Moderate persistent asthma, uncomplicated: Secondary | ICD-10-CM | POA: Diagnosis not present

## 2022-11-24 DIAGNOSIS — R296 Repeated falls: Secondary | ICD-10-CM

## 2022-11-24 NOTE — Addendum Note (Signed)
Addended by: Loyola Mast on: 11/24/2022 11:30 AM   Modules accepted: Orders

## 2022-11-24 NOTE — Assessment & Plan Note (Signed)
Blood pressure is in good control. Continue losartan-HCTZ (Hyzaar) 100-25 mg daily.

## 2022-11-24 NOTE — Assessment & Plan Note (Signed)
Continue fluticasone furoate AEPB daily and albuterol as needed.

## 2022-11-24 NOTE — Progress Notes (Signed)
[facility] [address] [place] [address] Dept: [phone] Dept Fax: [phone]  Chronic Care Office Visit  Subjective:    Patient ID: Jessica Huff, female    DOB: 11-12-1960, 62 y.o..   MRN: [id number]  Chief Complaint  Patient presents with   Hypertension    3 month f/u.  No concerns fasting today .   History of Present Illness:  Patient is in today for reassessment of chronic medical issues.  Jessica Huff has a history of hypertension. She is currently on Hyzaar 100-25 mg daily.   Jessica Huff has a history of hyperlipidemia. She is managed on rosuvastatin 40 mg daily and ezetimibe 10 mg daily.   Jessica Huff has a history of moderate persistent asthma. She is on fluticasone furoate inhaler daily and albuterol inhaler as needed.  Jessica Huff has a history of a lumbar radiculopathy that required lumbar fusion. She has continue to have some low back pain. She admits to multiple falls she has suffered int he past several months.   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   Acute pain of right knee 02/07/2021   Malaise and fatigue 02/07/2021   Prediabetes 10/19/2018   S/P lumbar fusion 09/16/2018   Asthma in adult, moderate persistent, uncomplicated 07/03/2016   Chronic pain of right ankle 03/02/2016   Allergic rhinitis 11/29/2015   Class 1 obesity due to excess calories with serious comorbidity and body mass index (BMI) of 32.0 to 32.9 in adult 11/29/2015   Benign positional vertigo 04/07/2015   Chronic bilateral low back pain with sciatica 04/07/2015   Common migraine 04/07/2015   Hyperlipidemia 04/07/2015   Essential hypertension 04/07/2015   Insomnia 04/07/2015   Obstructive sleep apnea 04/07/2015   Lumbar radiculopathy  04/07/2015   Anxiety 04/07/2015   Past Surgical History:  Procedure Laterality Date   ANKLE SURGERY Right 2018   LUMBAR FUSION  2020   L4-L5   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   Procedure: RIGHT TOTAL KNEE ARTHROPLASTY;  Surgeon: Nadara Mustard, MD;  Location: [facility] OR;  Service: Orthopedics;  Laterality: Right;   Family History  Problem Relation Age of Onset   Diabetes Mother    Kidney disease Mother    Cancer Father        Kidney   Outpatient Medications Prior to Visit  Medication Sig Dispense Refill   albuterol (VENTOLIN HFA) 108 (90 Base) MCG/ACT inhaler INHALE 2 PUFFS INTO THE LUNGS EVERY 6 HOURS AS NEEDED 6.7 g 6   ezetimibe (ZETIA) 10 MG tablet TAKE 1 TABLET(10 MG) BY MOUTH DAILY 90 tablet 3   fluticasone (FLONASE) 50 MCG/ACT nasal spray SHAKE LIQUID AND USE 2 SPRAYS IN EACH NOSTRIL DAILY 16 g 6   Fluticasone Furoate 200 MCG/ACT AEPB Inhale 1 Inhalation into the lungs daily. 30 each 11   levocetirizine (XYZAL) 5 MG tablet Take 5 mg by mouth daily as needed for allergies.     losartan-hydrochlorothiazide (HYZAAR) 100-25 MG tablet TAKE 1 TABLET BY MOUTH DAILY 90 tablet 3   montelukast (SINGULAIR) 10 MG tablet Take 1 tablet (10 mg total) by mouth at bedtime. 30 tablet 1   Multiple Vitamin (MULTI-VITAMIN) tablet Take 1 tablet by mouth daily.     pregabalin (LYRICA) 200 MG capsule Take 1 capsule (200 mg total)  by mouth 2 (two) times daily. 60 capsule 5   rosuvastatin (CRESTOR) 40 MG tablet TAKE 1 TABLET(40 MG) BY MOUTH DAILY 90 tablet 3   ALPRAZolam (XANAX) 0.5 MG tablet Take 1 tablet (0.5 mg total) by mouth daily as needed for flying (Patient not taking: Reported on 11/24/2022) 2 tablet 0   amitriptyline (ELAVIL) 25 MG tablet Take 1 tablet (25 mg total) by mouth at bedtime. 30 tablet 4   No facility-administered medications prior to visit.   No Known Allergies Objective:   Today's Vitals   11/24/22 1040  BP: 118/68  Pulse: 76  Temp: 98 F (36.7 C)  TempSrc: Temporal   SpO2: 97%  Weight: 232 lb (105.2 kg)  Height: 6' (1.829 m)   Body mass index is 31.46 kg/m.   General: Well developed, well nourished. No acute distress. Lungs: Clear to auscultation bilaterally. No wheezing, rales or rhonchi. Psych: Alert and oriented. Normal mood and affect.  Health Maintenance Due  Topic Date Due   Hepatitis C Screening  Never done   DTaP/Tdap/Td (2 - Td or Tdap) 01/11/2022     Assessment & Plan:   Problem List Items Addressed This Visit       Cardiovascular and Mediastinum   Essential hypertension - Primary    Blood pressure is in good control. Continue losartan-HCTZ (Hyzaar) 100-25 mg daily.         Respiratory   Asthma in adult, moderate persistent, uncomplicated    Continue fluticasone furoate AEPB daily and albuterol as needed.        Nervous and Auditory   Chronic bilateral low back pain with sciatica    In light of the recent falls, I will refer her to PT for strengthening and balance training.      Relevant Orders   Ambulatory referral to Physical Therapy     Other   S/P lumbar fusion   Relevant Orders   Ambulatory referral to Physical Therapy   Other Visit Diagnoses     Frequent falls       Relevant Orders   Ambulatory referral to Physical Therapy       Return in about 3 months (around 02/24/2023) for Reassessment.   Loyola Mast, MD

## 2022-11-24 NOTE — Telephone Encounter (Signed)
1st missed visit 11/23/2022, flat tire

## 2022-11-24 NOTE — Assessment & Plan Note (Signed)
In light of the recent falls, I will refer her to PT for strengthening and balance training.

## 2022-12-02 ENCOUNTER — Encounter: Payer: Self-pay | Admitting: Family Medicine

## 2022-12-06 ENCOUNTER — Encounter: Payer: Self-pay | Admitting: Physical Medicine and Rehabilitation

## 2022-12-08 NOTE — Therapy (Unsigned)
OUTPATIENT PHYSICAL THERAPY THORACOLUMBAR EVALUATION   Patient Name: Jessica Huff MRN: [id number] DOB:[birth year], 62 y.o., female Today's Date: 12/09/2022  END OF SESSION:  PT End of Session - 12/09/22 1502     Visit Number 1    Date for PT Re-Evaluation 02/17/23    PT Start Time 1502    PT Stop Time 1535    PT Time Calculation (min) 33 min             Past Medical History:  Diagnosis Date   Asthma    GERD (gastroesophageal reflux disease)    Hyperlipidemia    Hypertension    Past Surgical History:  Procedure Laterality Date   ANKLE SURGERY Right 2018   LUMBAR FUSION  2020   L4-L5   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   Procedure: RIGHT TOTAL KNEE ARTHROPLASTY;  Surgeon: Nadara Mustard, MD;  Location: [facility] OR;  Service: Orthopedics;  Laterality: Right;   Patient Active Problem List   Diagnosis Date Noted   History of uterine fibroid 06/01/2022   Lumbar radiculitis 05/07/2022   Myofascial pain 04/08/2022   Total knee replacement status, right 06/25/2021   Acute pain of right knee 02/07/2021   Malaise and fatigue 02/07/2021   Prediabetes 10/19/2018   S/P lumbar fusion 09/16/2018   Asthma in adult, moderate persistent, uncomplicated 07/03/2016   Chronic pain of right ankle 03/02/2016   Allergic rhinitis 11/29/2015   Class 1 obesity due to excess calories with serious comorbidity and body mass index (BMI) of 32.0 to 32.9 in adult 11/29/2015   Benign positional vertigo 04/07/2015   Chronic bilateral low back pain with sciatica 04/07/2015   Common migraine 04/07/2015   Hyperlipidemia 04/07/2015   Essential hypertension 04/07/2015   Insomnia 04/07/2015   Obstructive sleep apnea 04/07/2015   Lumbar radiculopathy 04/07/2015   Anxiety 04/07/2015   PCP: Loyola Mast, MD  REFERRING PROVIDER: Loyola Mast, MD  REFERRING DIAG:  [phone] (ICD-10-CM) - Chronic bilateral low back pain with right-sided sciatica  Z98.1 (ICD-10-CM) - S/P lumbar fusion   R29.6 (ICD-10-CM) - Frequent fall   Rationale for Evaluation and Treatment: Rehabilitation  THERAPY DIAG:  Difficulty in walking, not elsewhere classified  Muscle weakness (generalized)  Repeated falls  Unsteadiness on feet  Acute pain of right knee  ONSET DATE: 11/24/22  SUBJECTIVE:                                                                                                                                                                                           SUBJECTIVE STATEMENT: Patient reports that she falls a lot ever since her R TKR  in 5/23. Her R leg drags  PERTINENT HISTORY:  Per referring physician note: PMHx: hyperlipidemia, HTN, lumbar radiculopathy with fusion, 2020, R TKR 5/22  Chronic bilateral low back pain with sciatica       In light of the recent falls, I will refer her to PT for strengthening and balance training.      PAIN:  Are you having pain? Yes: NPRS scale: 5/10 Pain location: Low back Pain description: numbness and tingling into R toes Aggravating factors: nothing specific, steps, walking long er distances-grocery storer Relieving factors: Stopping whatever task she is performing,   PRECAUTIONS: Back and Fall  RED FLAGS: None   WEIGHT BEARING RESTRICTIONS: No  FALLS:  Has patient fallen in last 6 months? Yes. Number of falls 3 she feels like her R knee gave way.  LIVING ENVIRONMENT: Lives with: lives alone Lives in: House/apartment Stairs: Yes: Internal: 12 steps; on right going up Has following equipment at home: Single point cane and Walker - 2 wheeled  OCCUPATION: Out of work since 2022 due to R knee giving out. Was working at [employer], had to stand, walk, bend over, tec.  PLOF: Independent  PATIENT GOALS: Patient reports that she would like to prevent falls and injury, possibly get back to some sort of work  NEXT MD VISIT: 2 1/2 months  OBJECTIVE:  Note: Objective measures were completed at Evaluation unless  otherwise noted.  DIAGNOSTIC FINDINGS:  Per lumbar MRI 03/02/21 IMPRESSION: 1. Status post posterior instrumented fusion at L4-L5 without evidence of complication. 2. Overall mild multilevel degenerative changes throughout the lumbar spine detailed above are not significantly changed since 2021, including mild bilateral subarticular zone and neural foraminal narrowing at L3-L4, mild left subarticular zone and mild left worse than right neural foraminal stenosis at L4-L5, and mild right subarticular zone narrowing at L5-S1, without evidence of nerve root impingement.  COGNITION: Overall cognitive status: Within functional limits for tasks assessed     SENSATION: Light touch: Impaired   MUSCLE LENGTH: Hamstrings: Right 40 deg; Left 45 deg Thomas test: B WFL  POSTURE: increased lumbar lordosis, anterior pelvic tilt, left pelvic obliquity, and weight shift left  PALPATION: Tight in lumbar paraspinals, B, TTP B piriformis and ITB, R gluts prox hamstring tendons  LUMBAR ROM:   AROM eval  Flexion Mid shin  Extension WNL  Right lateral flexion Mid thigh, P  Left lateral flexion Mid thigh, P  Right rotation 30%  Left rotation 60%   (Blank rows = not tested)  LOWER EXTREMITY ROM: BLE WFL, R ankle DF slightly limited.  LOWER EXTREMITY MMT:    MMT Right eval Left eval  Hip flexion 3- 4  Hip extension 3- 4  Hip abduction 3- 4  Hip adduction    Hip internal rotation    Hip external rotation    Knee flexion 3+ 4  Knee extension 4- 4  Ankle dorsiflexion 3+ 4  Ankle plantarflexion    Ankle inversion    Ankle eversion     (Blank rows = not tested)  LUMBAR SPECIAL TESTS:  Straight leg raise test: Negative and Single leg stance test: Positive, slump test neg  FUNCTIONAL TESTS:  5 times sit to stand: 25.41 Timed up and go (TUG): 19.09  GAIT: Distance walked: In clinic distances Assistive device utilized: None Level of assistance: Modified independence Comments:  Increased L lateral sway, decreased WB and stance on R, unsteady, slow and labored.  TODAY'S TREATMENT:  DATE:  12/09/22 Education    PATIENT EDUCATION:  Education details: POC Person educated: Patient Education method: Explanation Education comprehension: verbalized understanding  HOME EXERCISE PROGRAM: TBD  ASSESSMENT:  CLINICAL IMPRESSION: Patient is a 62 y.o. who was seen today for physical therapy evaluation and treatment for chronic LBP . She had lumbar fusion in 2020. She also reports mult falls and R knee giving way. It has been weak ever since her R TKR in 2019. She presents with overall weakness, R weaker than L, decreased balance, abnormal gait, decreased safety and I with all functional mobility with increased fall risk. She will benefit from PT to improve her flexibility, increase her strength, improve her motor control and coordination, improve her balance, in order to normalize her gait and balance and decrease fall risk as well as pain.  OBJECTIVE IMPAIRMENTS: Abnormal gait, decreased activity tolerance, decreased balance, decreased coordination, decreased endurance, difficulty walking, decreased ROM, decreased strength, impaired flexibility, improper body mechanics, postural dysfunction, and pain.   ACTIVITY LIMITATIONS: carrying, lifting, bending, sitting, standing, squatting, sleeping, stairs, transfers, and locomotion level  PARTICIPATION LIMITATIONS: meal prep, cleaning, laundry, driving, shopping, community activity, and occupation  PERSONAL FACTORS: Past/current experiences are also affecting patient's functional outcome.   REHAB POTENTIAL: Good  CLINICAL DECISION MAKING: Stable/uncomplicated  EVALUATION COMPLEXITY: Moderate   GOALS: Goals reviewed with patient? Yes  SHORT TERM GOALS: Target date: 12/25/22  I with initial  HEP Baseline: Goal status: INITIAL  LONG TERM GOALS: Target date: 02/17/23  I with final HEP Baseline:  Goal status: INITIAL  2.  Patient will recover lumbar ROM to at least 80% in all planes of movement with back pain < 3/10 Baseline:  Goal status: INITIAL  3.  Increase B LE strength to at least 4+/5. Baseline:  Goal status: INITIAL  4.  Complete 5x STS in < 12 sec with equal WB Baseline:  Goal status: INITIAL  5.  Complete TUG in < 12 sec with no unsteadiness and normalized gait pattern Baseline:  Goal status: INITIAL  6.  Patient will ambulate at least 400' in 6 minute walk test, demonstrating normalized gait pattern, no unsteadiness Baseline:  Goal status: INITIAL  PLAN:  PT FREQUENCY: 2x/week  PT DURATION: 10 weeks  PLANNED INTERVENTIONS: 97110-Therapeutic exercises, 97530- Therapeutic activity, 97112- Neuromuscular re-education, 97535- Self Care, 16109- Manual therapy, [id number]- Gait training, 97014- Electrical stimulation (unattended), [phone]- Ionotophoresis 4mg /ml Dexamethasone, Patient/Family education, Balance training, Stair training, Dry Needling, Joint mobilization, Spinal mobilization, Cryotherapy, and Moist heat.  PLAN FOR NEXT SESSION: Initiate HEP   Iona Beard, DPT 12/09/2022, 3:52 PM

## 2022-12-09 ENCOUNTER — Ambulatory Visit: Payer: Medicaid Other | Attending: Family Medicine | Admitting: Physical Therapy

## 2022-12-09 ENCOUNTER — Encounter: Payer: Self-pay | Admitting: Physical Therapy

## 2022-12-09 DIAGNOSIS — M25561 Pain in right knee: Secondary | ICD-10-CM

## 2022-12-09 DIAGNOSIS — G8929 Other chronic pain: Secondary | ICD-10-CM | POA: Diagnosis not present

## 2022-12-09 DIAGNOSIS — R262 Difficulty in walking, not elsewhere classified: Secondary | ICD-10-CM

## 2022-12-09 DIAGNOSIS — R296 Repeated falls: Secondary | ICD-10-CM

## 2022-12-09 DIAGNOSIS — M5441 Lumbago with sciatica, right side: Secondary | ICD-10-CM | POA: Insufficient documentation

## 2022-12-09 DIAGNOSIS — R600 Localized edema: Secondary | ICD-10-CM | POA: Diagnosis present

## 2022-12-09 DIAGNOSIS — M6281 Muscle weakness (generalized): Secondary | ICD-10-CM | POA: Diagnosis present

## 2022-12-09 DIAGNOSIS — R2681 Unsteadiness on feet: Secondary | ICD-10-CM | POA: Diagnosis present

## 2022-12-09 DIAGNOSIS — Z981 Arthrodesis status: Secondary | ICD-10-CM | POA: Insufficient documentation

## 2022-12-14 ENCOUNTER — Ambulatory Visit: Payer: Medicaid Other | Admitting: Physical Therapy

## 2022-12-14 ENCOUNTER — Encounter: Payer: Self-pay | Admitting: Physical Therapy

## 2022-12-14 ENCOUNTER — Telehealth: Payer: Self-pay

## 2022-12-14 DIAGNOSIS — R262 Difficulty in walking, not elsewhere classified: Secondary | ICD-10-CM

## 2022-12-14 DIAGNOSIS — R2681 Unsteadiness on feet: Secondary | ICD-10-CM

## 2022-12-14 DIAGNOSIS — R600 Localized edema: Secondary | ICD-10-CM

## 2022-12-14 DIAGNOSIS — M25561 Pain in right knee: Secondary | ICD-10-CM

## 2022-12-14 DIAGNOSIS — M6281 Muscle weakness (generalized): Secondary | ICD-10-CM

## 2022-12-14 DIAGNOSIS — R296 Repeated falls: Secondary | ICD-10-CM

## 2022-12-14 NOTE — Telephone Encounter (Signed)
Disability form received from [employer].  Form filled out & faxed to [phone]. Place original in the scan forlder and kept a copy at desk.  Dm/cma

## 2022-12-14 NOTE — Therapy (Signed)
OUTPATIENT PHYSICAL THERAPY THORACOLUMBAR EVALUATION   Patient Name: Jessica Huff MRN: [id number] DOB:04/29/1960, 62 y.o., female Today's Date: 12/14/2022  END OF SESSION:  PT End of Session - 12/14/22 1806     Visit Number 2    Date for PT Re-Evaluation 02/17/23    PT Start Time 1800    PT Stop Time 1840    PT Time Calculation (min) 40 min            Past Medical History:  Diagnosis Date   Asthma    GERD (gastroesophageal reflux disease)    Hyperlipidemia    Hypertension    Past Surgical History:  Procedure Laterality Date   ANKLE SURGERY Right 2018   LUMBAR FUSION  2020   L4-L5   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   Procedure: RIGHT TOTAL KNEE ARTHROPLASTY;  Surgeon: Nadara Mustard, MD;  Location: [facility] OR;  Service: Orthopedics;  Laterality: Right;   Patient Active Problem List   Diagnosis Date Noted   History of uterine fibroid 06/01/2022   Lumbar radiculitis 05/07/2022   Myofascial pain 04/08/2022   Total knee replacement status, right 06/25/2021   Acute pain of right knee 02/07/2021   Malaise and fatigue 02/07/2021   Prediabetes 10/19/2018   S/P lumbar fusion 09/16/2018   Asthma in adult, moderate persistent, uncomplicated 07/03/2016   Chronic pain of right ankle 03/02/2016   Allergic rhinitis 11/29/2015   Class 1 obesity due to excess calories with serious comorbidity and body mass index (BMI) of 32.0 to 32.9 in adult 11/29/2015   Benign positional vertigo 04/07/2015   Chronic bilateral low back pain with sciatica 04/07/2015   Common migraine 04/07/2015   Hyperlipidemia 04/07/2015   Essential hypertension 04/07/2015   Insomnia 04/07/2015   Obstructive sleep apnea 04/07/2015   Lumbar radiculopathy 04/07/2015   Anxiety 04/07/2015   PCP: Loyola Mast, MD  REFERRING PROVIDER: Loyola Mast, MD  REFERRING DIAG:  [phone] (ICD-10-CM) - Chronic bilateral low back pain with right-sided sciatica  Z98.1 (ICD-10-CM) - S/P lumbar fusion   R29.6 (ICD-10-CM) - Frequent fall   Rationale for Evaluation and Treatment: Rehabilitation  THERAPY DIAG:  Difficulty in walking, not elsewhere classified  Muscle weakness (generalized)  Repeated falls  Unsteadiness on feet  Acute pain of right knee  Localized edema  ONSET DATE: 11/24/22  SUBJECTIVE:                                                                                                                                                                                           SUBJECTIVE STATEMENT: Patient reports that she was very sore after the evaluation  for 1-2 days.  PERTINENT HISTORY:  Per referring physician note: PMHx: hyperlipidemia, HTN, lumbar radiculopathy with fusion, 2020, R TKR 5/22  Chronic bilateral low back pain with sciatica       In light of the recent falls, I will refer her to PT for strengthening and balance training.      PAIN:  Are you having pain? Yes: NPRS scale: 5/10 Pain location: Low back Pain description: numbness and tingling into R toes Aggravating factors: nothing specific, steps, walking long er distances-grocery storer Relieving factors: Stopping whatever task she is performing,   PRECAUTIONS: Back and Fall  RED FLAGS: None   WEIGHT BEARING RESTRICTIONS: No  FALLS:  Has patient fallen in last 6 months? Yes. Number of falls 3 she feels like her R knee gave way.  LIVING ENVIRONMENT: Lives with: lives alone Lives in: House/apartment Stairs: Yes: Internal: 12 steps; on right going up Has following equipment at home: Single point cane and Walker - 2 wheeled  OCCUPATION: Out of work since 2022 due to R knee giving out. Was working at [employer], had to stand, walk, bend over, tec.  PLOF: Independent  PATIENT GOALS: Patient reports that she would like to prevent falls and injury, possibly get back to some sort of work  NEXT MD VISIT: 2 1/2 months  OBJECTIVE:  Note: Objective measures were completed at Evaluation  unless otherwise noted.  DIAGNOSTIC FINDINGS:  Per lumbar MRI 03/02/21 IMPRESSION: 1. Status post posterior instrumented fusion at L4-L5 without evidence of complication. 2. Overall mild multilevel degenerative changes throughout the lumbar spine detailed above are not significantly changed since 2021, including mild bilateral subarticular zone and neural foraminal narrowing at L3-L4, mild left subarticular zone and mild left worse than right neural foraminal stenosis at L4-L5, and mild right subarticular zone narrowing at L5-S1, without evidence of nerve root impingement.  COGNITION: Overall cognitive status: Within functional limits for tasks assessed     SENSATION: Light touch: Impaired   MUSCLE LENGTH: Hamstrings: Right 40 deg; Left 45 deg Thomas test: B WFL  POSTURE: increased lumbar lordosis, anterior pelvic tilt, left pelvic obliquity, and weight shift left  PALPATION: Tight in lumbar paraspinals, B, TTP B piriformis and ITB, R gluts prox hamstring tendons  LUMBAR ROM:   AROM eval  Flexion Mid shin  Extension WNL  Right lateral flexion Mid thigh, P  Left lateral flexion Mid thigh, P  Right rotation 30%  Left rotation 60%   (Blank rows = not tested)  LOWER EXTREMITY ROM: BLE WFL, R ankle DF slightly limited.  LOWER EXTREMITY MMT:    MMT Right eval Left eval  Hip flexion 3- 4  Hip extension 3- 4  Hip abduction 3- 4  Hip adduction    Hip internal rotation    Hip external rotation    Knee flexion 3+ 4  Knee extension 4- 4  Ankle dorsiflexion 3+ 4  Ankle plantarflexion    Ankle inversion    Ankle eversion     (Blank rows = not tested)  LUMBAR SPECIAL TESTS:  Straight leg raise test: Negative and Single leg stance test: Positive, slump test neg  FUNCTIONAL TESTS:  5 times sit to stand: 25.41 Timed up and go (TUG): 19.09  GAIT: Distance walked: In clinic distances Assistive device utilized: None Level of assistance: Modified  independence Comments: Increased L lateral sway, decreased WB and stance on R, unsteady, slow and labored.  TODAY'S TREATMENT:  DATE:  12/14/22 NuStep L5 x 6 minutes Supine stretching- SKTC, DKTC, piriformis, LTR Supine strengthening- PPT and hold, clamshells with G tband, bridging x 10 each, R SLR 5 reps, SAQ- too easy on R Seated LAQ with 5 sec hold x 10 reps Seated HS stretch  12/09/22 Education    PATIENT EDUCATION:  Education details: POC Person educated: Patient Education method: Explanation Education comprehension: verbalized understanding  HOME EXERCISE PROGRAM:  [id number]  ASSESSMENT:  CLINICAL IMPRESSION: Patient reports that she was sore after evaluation, last 1-2 days. Initiated HEP with stretching and strengthening. She was able to perform all exercises, reported no pain.   OBJECTIVE IMPAIRMENTS: Abnormal gait, decreased activity tolerance, decreased balance, decreased coordination, decreased endurance, difficulty walking, decreased ROM, decreased strength, impaired flexibility, improper body mechanics, postural dysfunction, and pain.   ACTIVITY LIMITATIONS: carrying, lifting, bending, sitting, standing, squatting, sleeping, stairs, transfers, and locomotion level  PARTICIPATION LIMITATIONS: meal prep, cleaning, laundry, driving, shopping, community activity, and occupation  PERSONAL FACTORS: Past/current experiences are also affecting patient's functional outcome.   REHAB POTENTIAL: Good  CLINICAL DECISION MAKING: Stable/uncomplicated  EVALUATION COMPLEXITY: Moderate   GOALS: Goals reviewed with patient? Yes  SHORT TERM GOALS: Target date: 12/25/22  I with initial HEP Baseline: Goal status: 12/14/22 initiated, ongoing  LONG TERM GOALS: Target date: 02/17/23  I with final HEP Baseline:  Goal status: INITIAL  2.  Patient will  recover lumbar ROM to at least 80% in all planes of movement with back pain < 3/10 Baseline:  Goal status: INITIAL  3.  Increase B LE strength to at least 4+/5. Baseline:  Goal status: INITIAL  4.  Complete 5x STS in < 12 sec with equal WB Baseline:  Goal status: INITIAL  5.  Complete TUG in < 12 sec with no unsteadiness and normalized gait pattern Baseline:  Goal status: INITIAL  6.  Patient will ambulate at least 400' in 6 minute walk test, demonstrating normalized gait pattern, no unsteadiness Baseline:  Goal status: INITIAL  PLAN:  PT FREQUENCY: 2x/week  PT DURATION: 10 weeks  PLANNED INTERVENTIONS: 97110-Therapeutic exercises, 97530- Therapeutic activity, 97112- Neuromuscular re-education, 97535- Self Care, 30160- Manual therapy, [id number]- Gait training, 97014- Electrical stimulation (unattended), [phone]- Ionotophoresis 4mg /ml Dexamethasone, Patient/Family education, Balance training, Stair training, Dry Needling, Joint mobilization, Spinal mobilization, Cryotherapy, and Moist heat.  PLAN FOR NEXT SESSION: Lower body strengthening. R knee gives at times, B hips and R knee very weak.   Iona Beard, DPT 12/14/2022, 6:51 PM

## 2022-12-17 ENCOUNTER — Ambulatory Visit: Payer: Medicaid Other | Admitting: Physical Therapy

## 2022-12-17 ENCOUNTER — Encounter: Payer: Self-pay | Admitting: Physical Therapy

## 2022-12-17 ENCOUNTER — Telehealth: Payer: Self-pay | Admitting: *Deleted

## 2022-12-17 ENCOUNTER — Other Ambulatory Visit: Payer: Self-pay | Admitting: Physical Medicine and Rehabilitation

## 2022-12-17 DIAGNOSIS — M6281 Muscle weakness (generalized): Secondary | ICD-10-CM

## 2022-12-17 DIAGNOSIS — M25561 Pain in right knee: Secondary | ICD-10-CM

## 2022-12-17 DIAGNOSIS — R2681 Unsteadiness on feet: Secondary | ICD-10-CM

## 2022-12-17 DIAGNOSIS — R600 Localized edema: Secondary | ICD-10-CM

## 2022-12-17 DIAGNOSIS — R262 Difficulty in walking, not elsewhere classified: Secondary | ICD-10-CM

## 2022-12-17 DIAGNOSIS — R296 Repeated falls: Secondary | ICD-10-CM

## 2022-12-17 NOTE — Therapy (Signed)
OUTPATIENT PHYSICAL THERAPY THORACOLUMBAR EVALUATION   Patient Name: Jessica Huff MRN: [id number] DOB:1960/12/09, 62 y.o., female Today's Date: 12/17/2022  END OF SESSION:  PT End of Session - 12/17/22 1427     Visit Number 3    Date for PT Re-Evaluation 02/17/23    Activity Tolerance Patient tolerated treatment well    Behavior During Therapy [facility] for tasks assessed/performed            Past Medical History:  Diagnosis Date   Asthma    GERD (gastroesophageal reflux disease)    Hyperlipidemia    Hypertension    Past Surgical History:  Procedure Laterality Date   ANKLE SURGERY Right 2018   LUMBAR FUSION  2020   L4-L5   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   Procedure: RIGHT TOTAL KNEE ARTHROPLASTY;  Surgeon: Nadara Mustard, MD;  Location: [facility] OR;  Service: Orthopedics;  Laterality: Right;   Patient Active Problem List   Diagnosis Date Noted   History of uterine fibroid 06/01/2022   Lumbar radiculitis 05/07/2022   Myofascial pain 04/08/2022   Total knee replacement status, right 06/25/2021   Acute pain of right knee 02/07/2021   Malaise and fatigue 02/07/2021   Prediabetes 10/19/2018   S/P lumbar fusion 09/16/2018   Asthma in adult, moderate persistent, uncomplicated 07/03/2016   Chronic pain of right ankle 03/02/2016   Allergic rhinitis 11/29/2015   Class 1 obesity due to excess calories with serious comorbidity and body mass index (BMI) of 32.0 to 32.9 in adult 11/29/2015   Benign positional vertigo 04/07/2015   Chronic bilateral low back pain with sciatica 04/07/2015   Common migraine 04/07/2015   Hyperlipidemia 04/07/2015   Essential hypertension 04/07/2015   Insomnia 04/07/2015   Obstructive sleep apnea 04/07/2015   Lumbar radiculopathy 04/07/2015   Anxiety 04/07/2015   PCP: Loyola Mast, MD  REFERRING PROVIDER: Loyola Mast, MD  REFERRING DIAG:  [phone] (ICD-10-CM) - Chronic bilateral low back pain with right-sided sciatica  Z98.1  (ICD-10-CM) - S/P lumbar fusion  R29.6 (ICD-10-CM) - Frequent fall   Rationale for Evaluation and Treatment: Rehabilitation  THERAPY DIAG:  Difficulty in walking, not elsewhere classified  Muscle weakness (generalized)  Repeated falls  Unsteadiness on feet  Localized edema  Acute pain of right knee  ONSET DATE: 11/24/22  SUBJECTIVE:                                                                                                                                                                                           SUBJECTIVE STATEMENT: No falls, Doing ok  PERTINENT HISTORY:  Per referring physician note: PMHx: hyperlipidemia,  HTN, lumbar radiculopathy with fusion, 2020, R TKR 5/22  Chronic bilateral low back pain with sciatica       In light of the recent falls, I will refer her to PT for strengthening and balance training.      PAIN:  Are you having pain? Yes: NPRS scale: 4/10 Pain location: Low back Pain description: numbness and tingling into R toes Aggravating factors: nothing specific, steps, walking long er distances-grocery storer Relieving factors: Stopping whatever task she is performing,   PRECAUTIONS: Back and Fall  RED FLAGS: None   WEIGHT BEARING RESTRICTIONS: No  FALLS:  Has patient fallen in last 6 months? Yes. Number of falls 3 she feels like her R knee gave way.  LIVING ENVIRONMENT: Lives with: lives alone Lives in: House/apartment Stairs: Yes: Internal: 12 steps; on right going up Has following equipment at home: Single point cane and Walker - 2 wheeled  OCCUPATION: Out of work since 2022 due to R knee giving out. Was working at [employer], had to stand, walk, bend over, tec.  PLOF: Independent  PATIENT GOALS: Patient reports that she would like to prevent falls and injury, possibly get back to some sort of work  NEXT MD VISIT: 2 1/2 months  OBJECTIVE:  Note: Objective measures were completed at Evaluation unless otherwise  noted.  DIAGNOSTIC FINDINGS:  Per lumbar MRI 03/02/21 IMPRESSION: 1. Status post posterior instrumented fusion at L4-L5 without evidence of complication. 2. Overall mild multilevel degenerative changes throughout the lumbar spine detailed above are not significantly changed since 2021, including mild bilateral subarticular zone and neural foraminal narrowing at L3-L4, mild left subarticular zone and mild left worse than right neural foraminal stenosis at L4-L5, and mild right subarticular zone narrowing at L5-S1, without evidence of nerve root impingement.  COGNITION: Overall cognitive status: Within functional limits for tasks assessed     SENSATION: Light touch: Impaired   MUSCLE LENGTH: Hamstrings: Right 40 deg; Left 45 deg Thomas test: B WFL  POSTURE: increased lumbar lordosis, anterior pelvic tilt, left pelvic obliquity, and weight shift left  PALPATION: Tight in lumbar paraspinals, B, TTP B piriformis and ITB, R gluts prox hamstring tendons  LUMBAR ROM:   AROM eval  Flexion Mid shin  Extension WNL  Right lateral flexion Mid thigh, P  Left lateral flexion Mid thigh, P  Right rotation 30%  Left rotation 60%   (Blank rows = not tested)  LOWER EXTREMITY ROM: BLE WFL, R ankle DF slightly limited.  LOWER EXTREMITY MMT:    MMT Right eval Left eval  Hip flexion 3- 4  Hip extension 3- 4  Hip abduction 3- 4  Hip adduction    Hip internal rotation    Hip external rotation    Knee flexion 3+ 4  Knee extension 4- 4  Ankle dorsiflexion 3+ 4  Ankle plantarflexion    Ankle inversion    Ankle eversion     (Blank rows = not tested)  LUMBAR SPECIAL TESTS:  Straight leg raise test: Negative and Single leg stance test: Positive, slump test neg  FUNCTIONAL TESTS:  5 times sit to stand: 25.41 Timed up and go (TUG): 19.09  GAIT: Distance walked: In clinic distances Assistive device utilized: None Level of assistance: Modified independence Comments: Increased L  lateral sway, decreased WB and stance on R, unsteady, slow and labored.  TODAY'S TREATMENT:  DATE:  12/17/22 NuStep L5 x 6 minutes R knee PROM  LAQ RLE 2lb 2x10 HS curls yellow RLE 2x10 S2S 2x10 slightly elevated mat  Seated Rows & Lats 2x10 Shoulder Ext 5lb 2x10 RLE step usp x 10 RLE SLR x5 last three reps eccentrics   12/14/22 NuStep L5 x 6 minutes Supine stretching- SKTC, DKTC, piriformis, LTR Supine strengthening- PPT and hold, clamshells with G tband, bridging x 10 each, R SLR 5 reps, SAQ- too easy on R Seated LAQ with 5 sec hold x 10 reps Seated HS stretch  12/09/22 Education    PATIENT EDUCATION:  Education details: POC Person educated: Patient Education method: Explanation Education comprehension: verbalized understanding  HOME EXERCISE PROGRAM:  [id number]  ASSESSMENT:  CLINICAL IMPRESSION: Pt enters doing ok. She ambulates with decrease R hip flexion as well at decrease TKE. R HS weakness noted with HS curls. Cues not to favor L side with sit to stands. Postural cue needed with shoulder extensions. Pt only to elevate LLE ~ 30 deg with SLR due to weakness.  OBJECTIVE IMPAIRMENTS: Abnormal gait, decreased activity tolerance, decreased balance, decreased coordination, decreased endurance, difficulty walking, decreased ROM, decreased strength, impaired flexibility, improper body mechanics, postural dysfunction, and pain.   ACTIVITY LIMITATIONS: carrying, lifting, bending, sitting, standing, squatting, sleeping, stairs, transfers, and locomotion level  PARTICIPATION LIMITATIONS: meal prep, cleaning, laundry, driving, shopping, community activity, and occupation  PERSONAL FACTORS: Past/current experiences are also affecting patient's functional outcome.   REHAB POTENTIAL: Good  CLINICAL DECISION MAKING: Stable/uncomplicated  EVALUATION  COMPLEXITY: Moderate   GOALS: Goals reviewed with patient? Yes  SHORT TERM GOALS: Target date: 12/25/22  I with initial HEP Baseline: Goal status: 12/14/22 initiated, ongoing  LONG TERM GOALS: Target date: 02/17/23  I with final HEP Baseline:  Goal status: INITIAL  2.  Patient will recover lumbar ROM to at least 80% in all planes of movement with back pain < 3/10 Baseline:  Goal status: INITIAL  3.  Increase B LE strength to at least 4+/5. Baseline:  Goal status: INITIAL  4.  Complete 5x STS in < 12 sec with equal WB Baseline:  Goal status: INITIAL  5.  Complete TUG in < 12 sec with no unsteadiness and normalized gait pattern Baseline:  Goal status: INITIAL  6.  Patient will ambulate at least 400' in 6 minute walk test, demonstrating normalized gait pattern, no unsteadiness Baseline:  Goal status: INITIAL  PLAN:  PT FREQUENCY: 2x/week  PT DURATION: 10 weeks  PLANNED INTERVENTIONS: 97110-Therapeutic exercises, 97530- Therapeutic activity, 97112- Neuromuscular re-education, 97535- Self Care, 54627- Manual therapy, [id number]- Gait training, 97014- Electrical stimulation (unattended), [phone]- Ionotophoresis 4mg /ml Dexamethasone, Patient/Family education, Balance training, Stair training, Dry Needling, Joint mobilization, Spinal mobilization, Cryotherapy, and Moist heat.  PLAN FOR NEXT SESSION: Lower body strengthening. R knee gives at times, B hips and R knee very weak.   Iona Beard, DPT 12/17/2022, 2:28 PM

## 2022-12-17 NOTE — Telephone Encounter (Signed)
Please schedule with Jessica Huff per Dr Berline Chough for  UDS and CSA and to evaluate for medication.

## 2022-12-18 ENCOUNTER — Encounter: Payer: Medicaid Other | Attending: Physical Medicine and Rehabilitation | Admitting: Registered Nurse

## 2022-12-18 ENCOUNTER — Encounter: Payer: Self-pay | Admitting: Registered Nurse

## 2022-12-18 VITALS — BP 134/81 | HR 73 | Ht 72.0 in | Wt 232.0 lb

## 2022-12-18 DIAGNOSIS — M7062 Trochanteric bursitis, left hip: Secondary | ICD-10-CM | POA: Insufficient documentation

## 2022-12-18 DIAGNOSIS — G894 Chronic pain syndrome: Secondary | ICD-10-CM | POA: Insufficient documentation

## 2022-12-18 DIAGNOSIS — M7918 Myalgia, other site: Secondary | ICD-10-CM | POA: Insufficient documentation

## 2022-12-18 DIAGNOSIS — Z79891 Long term (current) use of opiate analgesic: Secondary | ICD-10-CM | POA: Diagnosis not present

## 2022-12-18 DIAGNOSIS — M5416 Radiculopathy, lumbar region: Secondary | ICD-10-CM | POA: Diagnosis not present

## 2022-12-18 DIAGNOSIS — Z5181 Encounter for therapeutic drug level monitoring: Secondary | ICD-10-CM | POA: Diagnosis not present

## 2022-12-18 DIAGNOSIS — M7061 Trochanteric bursitis, right hip: Secondary | ICD-10-CM | POA: Diagnosis not present

## 2022-12-18 NOTE — Progress Notes (Signed)
Subjective:    Patient ID: Jessica Huff, female    DOB: Jun 19, 1960, 62 y.o.   MRN: [id number]  HPI: Jessica Huff is a 62 y.o. female who returns for follow up appointment for chronic pain and medication refill. She states her pain is located in her lower back radiating into her right lower extremity  and bilateral hip pain. She rates her pain 5. Her current exercise regime is walking and going to physical therapy two days a week.      Pain Inventory Average Pain 5 Pain Right Now 5 My pain is tingling and aching  In the last 24 hours, has pain interfered with the following? General activity 5 Relation with others 6 Enjoyment of life 5 What TIME of day is your pain at its worst? daytime Sleep (in general) Poor  Pain is worse with: walking, bending, sitting, and some activites Pain improves with: rest and heat/ice Relief from Meds:  .  Family History  Problem Relation Age of Onset   Diabetes Mother    Kidney disease Mother    Cancer Father        Kidney   Social History   Socioeconomic History   Marital status: Divorced    Spouse name: Not on file   Number of children: 2   Years of education: Not on file   Highest education level: 10th grade  Occupational History   Not on file  Tobacco Use   Smoking status: Never   Smokeless tobacco: Never  Vaping Use   Vaping status: Never Used  Substance and Sexual Activity   Alcohol use: Yes    Comment: socially   Drug use: Never   Sexual activity: Yes  Other Topics Concern   Not on file  Social History Narrative   Not on file   Social Determinants of Health   Financial Resource Strain: Low Risk  (05/28/2022)   Overall Financial Resource Strain (CARDIA)    Difficulty of Paying Living Expenses: Not very hard  Food Insecurity: Food Insecurity Present (05/28/2022)   Hunger Vital Sign    Worried About Running Out of Food in the Last Year: Sometimes true    Ran Out of Food in the Last Year: Sometimes true  Transportation  Needs: No Transportation Needs (05/28/2022)   PRAPARE - Administrator, Civil Service (Medical): No    Lack of Transportation (Non-Medical): No  Physical Activity: Insufficiently Active (05/28/2022)   Exercise Vital Sign    Days of Exercise per Week: 2 days    Minutes of Exercise per Session: 40 min  Stress: No Stress Concern Present (05/28/2022)   [employer] of Occupational Health - Occupational Stress Questionnaire    Feeling of Stress : Only a little  Social Connections: Socially Isolated (05/28/2022)   Social Connection and Isolation Panel [NHANES]    Frequency of Communication with Friends and Family: Once a week    Frequency of Social Gatherings with Friends and Family: Once a week    Attends Religious Services: Never    Database administrator or Organizations: No    Attends Engineer, structural: Not on file    Marital Status: Divorced   Past Surgical History:  Procedure Laterality Date   ANKLE SURGERY Right 2018   LUMBAR FUSION  2020   L4-L5   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   Procedure: RIGHT TOTAL KNEE ARTHROPLASTY;  Surgeon: Nadara Mustard, MD;  Location: [facility] OR;  Service: Orthopedics;  Laterality: Right;  Past Surgical History:  Procedure Laterality Date   ANKLE SURGERY Right 2018   LUMBAR FUSION  2020   L4-L5   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   Procedure: RIGHT TOTAL KNEE ARTHROPLASTY;  Surgeon: Nadara Mustard, MD;  Location: [facility];  Service: Orthopedics;  Laterality: Right;   Past Medical History:  Diagnosis Date   Asthma    GERD (gastroesophageal reflux disease)    Hyperlipidemia    Hypertension    There were no vitals taken for this visit.  Opioid Risk Score:   Fall Risk Score:  `1  Depression screen [facility] 2/9     11/24/2022   10:52 AM 10/07/2022    9:12 AM 07/06/2022   11:18 AM 05/07/2022   11:20 AM 04/08/2022   11:10 AM 08/22/2021    1:33 PM 04/25/2021   12:57 PM  Depression screen PHQ 2/9  Decreased Interest 0 0 0 0  1 0 0  Down, Depressed, Hopeless 0 0 0 0 0 0 0  PHQ - 2 Score 0 0 0 0 1 0 0  Altered sleeping 3        Tired, decreased energy 3        Change in appetite 3        Feeling bad or failure about yourself  0        Trouble concentrating 0        Moving slowly or fidgety/restless 0        Suicidal thoughts 0        PHQ-9 Score 9        Difficult doing work/chores Somewhat difficult           Review of Systems  Musculoskeletal:  Positive for back pain.       Right knee pain  All other systems reviewed and are negative.     Objective:   Physical Exam Vitals and nursing note reviewed.  Constitutional:      Appearance: Normal appearance. She is obese.  Cardiovascular:     Rate and Rhythm: Normal rate and regular rhythm.     Pulses: Normal pulses.     Heart sounds: Normal heart sounds.  Pulmonary:     Effort: Pulmonary effort is normal.     Breath sounds: Normal breath sounds.  Musculoskeletal:     Cervical back: Normal range of motion and neck supple.     Comments: Normal Muscle Bulk and Muscle Testing Reveals:  Upper Extremities: Full ROM and Muscle Strength 5/5 Bilateral AC Joint Tenderness Lumbar Hypersensitivity Bilateral Greater Trochanter Tenderness Lower Extremities: Decreased ROM and Muscle Strength 5/5 Arises from Table slowly Antalgic Gait     Skin:    General: Skin is warm and dry.  Neurological:     Mental Status: She is alert and oriented to person, place, and time.  Psychiatric:        Mood and Affect: Mood normal.        Behavior: Behavior normal.         Assessment & Plan:  Lumbar Radiculitis: Ms. Devonshire reports she resume the Pregabalin: She is using it sparingly. Continue to Monitor.  Greater Trochanteric Bursitis: Continue to alternate with Heat and Ice Therapy. Continue to Monitor.  Myofascial Pain: She is scheduled for Trigger Point Injection with Dr Berline Chough next month. Continue to Monitor.  Chronic Pain Syndrome: UDS  ordered and Narcotic  Contract Reviewed and Sign, she verbalizes understanding. Awaiting Results, Dr Berline Chough will decide about her medication, she verbalizes  understanding.  F/U in 1 month

## 2022-12-21 ENCOUNTER — Encounter: Payer: Self-pay | Admitting: Physical Therapy

## 2022-12-21 ENCOUNTER — Ambulatory Visit: Payer: Medicaid Other | Admitting: Physical Therapy

## 2022-12-21 DIAGNOSIS — M25561 Pain in right knee: Secondary | ICD-10-CM

## 2022-12-21 DIAGNOSIS — M6281 Muscle weakness (generalized): Secondary | ICD-10-CM

## 2022-12-21 DIAGNOSIS — R600 Localized edema: Secondary | ICD-10-CM

## 2022-12-21 DIAGNOSIS — R296 Repeated falls: Secondary | ICD-10-CM

## 2022-12-21 DIAGNOSIS — R262 Difficulty in walking, not elsewhere classified: Secondary | ICD-10-CM | POA: Diagnosis not present

## 2022-12-21 DIAGNOSIS — R2681 Unsteadiness on feet: Secondary | ICD-10-CM

## 2022-12-21 NOTE — Therapy (Signed)
OUTPATIENT PHYSICAL THERAPY THORACOLUMBAR EVALUATION   Patient Name: Jessica Huff MRN: [id number] DOB:06-05-1960, 62 y.o., female Today's Date: 12/21/2022  END OF SESSION:  PT End of Session - 12/21/22 1230     Visit Number 4    Date for PT Re-Evaluation 02/17/23    PT Start Time 1100    PT Stop Time 1140    PT Time Calculation (min) 40 min    Activity Tolerance Patient tolerated treatment well    Behavior During Therapy [facility] for tasks assessed/performed             Past Medical History:  Diagnosis Date   Asthma    GERD (gastroesophageal reflux disease)    Hyperlipidemia    Hypertension    Past Surgical History:  Procedure Laterality Date   ANKLE SURGERY Right 2018   LUMBAR FUSION  2020   L4-L5   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   Procedure: RIGHT TOTAL KNEE ARTHROPLASTY;  Surgeon: Nadara Mustard, MD;  Location: [facility] OR;  Service: Orthopedics;  Laterality: Right;   Patient Active Problem List   Diagnosis Date Noted   History of uterine fibroid 06/01/2022   Lumbar radiculitis 05/07/2022   Myofascial pain 04/08/2022   Total knee replacement status, right 06/25/2021   Acute pain of right knee 02/07/2021   Malaise and fatigue 02/07/2021   Prediabetes 10/19/2018   S/P lumbar fusion 09/16/2018   Asthma in adult, moderate persistent, uncomplicated 07/03/2016   Chronic pain of right ankle 03/02/2016   Allergic rhinitis 11/29/2015   Class 1 obesity due to excess calories with serious comorbidity and body mass index (BMI) of 32.0 to 32.9 in adult 11/29/2015   Benign positional vertigo 04/07/2015   Chronic bilateral low back pain with sciatica 04/07/2015   Common migraine 04/07/2015   Hyperlipidemia 04/07/2015   Essential hypertension 04/07/2015   Insomnia 04/07/2015   Obstructive sleep apnea 04/07/2015   Lumbar radiculopathy 04/07/2015   Anxiety 04/07/2015   PCP: Loyola Mast, MD  REFERRING PROVIDER: Loyola Mast, MD  REFERRING DIAG:  [phone]  (ICD-10-CM) - Chronic bilateral low back pain with right-sided sciatica  Z98.1 (ICD-10-CM) - S/P lumbar fusion  R29.6 (ICD-10-CM) - Frequent fall   Rationale for Evaluation and Treatment: Rehabilitation  THERAPY DIAG:  Difficulty in walking, not elsewhere classified  Muscle weakness (generalized)  Repeated falls  Unsteadiness on feet  Localized edema  Acute pain of right knee  ONSET DATE: 11/24/22  SUBJECTIVE:  SUBJECTIVE STATEMENT: Patient reports no new issues, performing HEP.  PERTINENT HISTORY:  Per referring physician note: PMHx: hyperlipidemia, HTN, lumbar radiculopathy with fusion, 2020, R TKR 5/22  Chronic bilateral low back pain with sciatica       In light of the recent falls, I will refer her to PT for strengthening and balance training.      PAIN:  Are you having pain? Yes: NPRS scale: 4/10 Pain location: Low back Pain description: numbness and tingling into R toes Aggravating factors: nothing specific, steps, walking long er distances-grocery storer Relieving factors: Stopping whatever task she is performing,   PRECAUTIONS: Back and Fall  RED FLAGS: None   WEIGHT BEARING RESTRICTIONS: No  FALLS:  Has patient fallen in last 6 months? Yes. Number of falls 3 she feels like her R knee gave way.  LIVING ENVIRONMENT: Lives with: lives alone Lives in: House/apartment Stairs: Yes: Internal: 12 steps; on right going up Has following equipment at home: Single point cane and Walker - 2 wheeled  OCCUPATION: Out of work since 2022 due to R knee giving out. Was working at [employer], had to stand, walk, bend over, tec.  PLOF: Independent  PATIENT GOALS: Patient reports that she would like to prevent falls and injury, possibly get back to some sort of work  NEXT MD VISIT:  2 1/2 months  OBJECTIVE:  Note: Objective measures were completed at Evaluation unless otherwise noted.  DIAGNOSTIC FINDINGS:  Per lumbar MRI 03/02/21 IMPRESSION: 1. Status post posterior instrumented fusion at L4-L5 without evidence of complication. 2. Overall mild multilevel degenerative changes throughout the lumbar spine detailed above are not significantly changed since 2021, including mild bilateral subarticular zone and neural foraminal narrowing at L3-L4, mild left subarticular zone and mild left worse than right neural foraminal stenosis at L4-L5, and mild right subarticular zone narrowing at L5-S1, without evidence of nerve root impingement.  COGNITION: Overall cognitive status: Within functional limits for tasks assessed     SENSATION: Light touch: Impaired   MUSCLE LENGTH: Hamstrings: Right 40 deg; Left 45 deg Thomas test: B WFL  POSTURE: increased lumbar lordosis, anterior pelvic tilt, left pelvic obliquity, and weight shift left  PALPATION: Tight in lumbar paraspinals, B, TTP B piriformis and ITB, R gluts prox hamstring tendons  LUMBAR ROM:   AROM eval  Flexion Mid shin  Extension WNL  Right lateral flexion Mid thigh, P  Left lateral flexion Mid thigh, P  Right rotation 30%  Left rotation 60%   (Blank rows = not tested)  LOWER EXTREMITY ROM: BLE WFL, R ankle DF slightly limited.  LOWER EXTREMITY MMT:    MMT Right eval Left eval  Hip flexion 3- 4  Hip extension 3- 4  Hip abduction 3- 4  Hip adduction    Hip internal rotation    Hip external rotation    Knee flexion 3+ 4  Knee extension 4- 4  Ankle dorsiflexion 3+ 4  Ankle plantarflexion    Ankle inversion    Ankle eversion     (Blank rows = not tested)  LUMBAR SPECIAL TESTS:  Straight leg raise test: Negative and Single leg stance test: Positive, slump test neg  FUNCTIONAL TESTS:  5 times sit to stand: 25.41 Timed up and go (TUG): 19.09  GAIT: Distance walked: In clinic  distances Assistive device utilized: None Level of assistance: Modified independence Comments: Increased L lateral sway, decreased WB and stance on R, unsteady, slow and labored.  TODAY'S TREATMENT:  DATE:  12/21/22 Supine stretch with physioball- DKTC, LTR Supine strengthening- Quick side to side roll, bridge Seated abdominal isometric press into ball in lap. Seated forward flexion over physioball to stretch low back, x 5 forward and to each side. Seated knee flexion against yellow tband x 10 reps Seated knee ext against Yellow Tband x 10 reps each leg   12/17/22 NuStep L5 x 6 minutes R knee PROM  LAQ RLE 2lb 2x10 HS curls yellow RLE 2x10 S2S 2x10 slightly elevated mat  Seated Rows & Lats 2x10 Shoulder Ext 5lb 2x10 RLE step usp x 10 RLE SLR x5 last three reps eccentrics   12/14/22 NuStep L5 x 6 minutes Supine stretching- SKTC, DKTC, piriformis, LTR Supine strengthening- PPT and hold, clamshells with G tband, bridging x 10 each, R SLR 5 reps, SAQ- too easy on R Seated LAQ with 5 sec hold x 10 reps Seated HS stretch  12/09/22 Education    PATIENT EDUCATION:  Education details: POC Person educated: Patient Education method: Explanation Education comprehension: verbalized understanding  HOME EXERCISE PROGRAM:  [id number]  ASSESSMENT:  CLINICAL IMPRESSION: Pt enters with no new issues. Performed additional exercises for strengthening and stretch, using physioball as she has one at home, and Tband resistance. Plans to try the exercises at home and report on how they feel.  OBJECTIVE IMPAIRMENTS: Abnormal gait, decreased activity tolerance, decreased balance, decreased coordination, decreased endurance, difficulty walking, decreased ROM, decreased strength, impaired flexibility, improper body mechanics, postural dysfunction, and pain.   ACTIVITY  LIMITATIONS: carrying, lifting, bending, sitting, standing, squatting, sleeping, stairs, transfers, and locomotion level  PARTICIPATION LIMITATIONS: meal prep, cleaning, laundry, driving, shopping, community activity, and occupation  PERSONAL FACTORS: Past/current experiences are also affecting patient's functional outcome.   REHAB POTENTIAL: Good  CLINICAL DECISION MAKING: Stable/uncomplicated  EVALUATION COMPLEXITY: Moderate   GOALS: Goals reviewed with patient? Yes  SHORT TERM GOALS: Target date: 12/25/22  I with initial HEP Baseline: Goal status: 12/21/22 initiated, ongoing  LONG TERM GOALS: Target date: 02/17/23  I with final HEP Baseline:  Goal status: INITIAL  2.  Patient will recover lumbar ROM to at least 80% in all planes of movement with back pain < 3/10 Baseline:  Goal status: INITIAL  3.  Increase B LE strength to at least 4+/5. Baseline:  Goal status: 12/21/22- R knee 3+/5, ongoing  4.  Complete 5x STS in < 12 sec with equal WB Baseline:  Goal status: INITIAL  5.  Complete TUG in < 12 sec with no unsteadiness and normalized gait pattern Baseline:  Goal status: INITIAL  6.  Patient will ambulate at least 400' in 6 minute walk test, demonstrating normalized gait pattern, no unsteadiness Baseline:  Goal status: INITIAL  PLAN:  PT FREQUENCY: 2x/week  PT DURATION: 10 weeks  PLANNED INTERVENTIONS: 97110-Therapeutic exercises, 97530- Therapeutic activity, [id number]- Neuromuscular re-education, 97535- Self Care, [id number]- Manual therapy, [id number]- Gait training, 97014- Electrical stimulation (unattended), [phone]- Ionotophoresis 4mg /ml Dexamethasone, Patient/Family education, Balance training, Stair training, Dry Needling, Joint mobilization, Spinal mobilization, Cryotherapy, and Moist heat.  PLAN FOR NEXT SESSION: Lower body strengthening. R knee gives at times, B hips and R knee very weak.   Iona Beard, DPT 12/21/2022, 12:31 PM

## 2022-12-22 ENCOUNTER — Telehealth: Payer: Self-pay | Admitting: Family Medicine

## 2022-12-22 NOTE — Telephone Encounter (Signed)
12/22/22 - Pt called wanting to speak with the CMA per information from [employer]. She wants a call back from CMA to discuss this

## 2022-12-22 NOTE — Telephone Encounter (Signed)
Called patient and advised that the form for [employer] was filled out and faxed to them on 12/14/22.  Dm/cma

## 2022-12-23 ENCOUNTER — Ambulatory Visit: Payer: Medicaid Other | Admitting: Physical Therapy

## 2022-12-23 ENCOUNTER — Encounter: Payer: Self-pay | Admitting: Physical Therapy

## 2022-12-23 DIAGNOSIS — R262 Difficulty in walking, not elsewhere classified: Secondary | ICD-10-CM

## 2022-12-23 DIAGNOSIS — R296 Repeated falls: Secondary | ICD-10-CM

## 2022-12-23 DIAGNOSIS — M25561 Pain in right knee: Secondary | ICD-10-CM

## 2022-12-23 DIAGNOSIS — R2681 Unsteadiness on feet: Secondary | ICD-10-CM

## 2022-12-23 DIAGNOSIS — M6281 Muscle weakness (generalized): Secondary | ICD-10-CM

## 2022-12-23 DIAGNOSIS — R600 Localized edema: Secondary | ICD-10-CM

## 2022-12-23 NOTE — Therapy (Signed)
OUTPATIENT PHYSICAL THERAPY THORACOLUMBAR EVALUATION   Patient Name: Jessica Huff MRN: [id number] DOB:1960-10-26, 62 y.o., female Today's Date: 12/23/2022  END OF SESSION:  PT End of Session - 12/23/22 1344     Visit Number 5    Date for PT Re-Evaluation 02/17/23    PT Start Time 1315    PT Stop Time 1355    PT Time Calculation (min) 40 min    Activity Tolerance Patient tolerated treatment well    Behavior During Therapy [facility] for tasks assessed/performed              Past Medical History:  Diagnosis Date   Asthma    GERD (gastroesophageal reflux disease)    Hyperlipidemia    Hypertension    Past Surgical History:  Procedure Laterality Date   ANKLE SURGERY Right 2018   LUMBAR FUSION  2020   L4-L5   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   Procedure: RIGHT TOTAL KNEE ARTHROPLASTY;  Surgeon: Nadara Mustard, MD;  Location: [facility] OR;  Service: Orthopedics;  Laterality: Right;   Patient Active Problem List   Diagnosis Date Noted   History of uterine fibroid 06/01/2022   Lumbar radiculitis 05/07/2022   Myofascial pain 04/08/2022   Total knee replacement status, right 06/25/2021   Acute pain of right knee 02/07/2021   Malaise and fatigue 02/07/2021   Prediabetes 10/19/2018   S/P lumbar fusion 09/16/2018   Asthma in adult, moderate persistent, uncomplicated 07/03/2016   Chronic pain of right ankle 03/02/2016   Allergic rhinitis 11/29/2015   Class 1 obesity due to excess calories with serious comorbidity and body mass index (BMI) of 32.0 to 32.9 in adult 11/29/2015   Benign positional vertigo 04/07/2015   Chronic bilateral low back pain with sciatica 04/07/2015   Common migraine 04/07/2015   Hyperlipidemia 04/07/2015   Essential hypertension 04/07/2015   Insomnia 04/07/2015   Obstructive sleep apnea 04/07/2015   Lumbar radiculopathy 04/07/2015   Anxiety 04/07/2015   PCP: Loyola Mast, MD  REFERRING PROVIDER: Loyola Mast, MD  REFERRING DIAG:   [id number] (ICD-10-CM) - Chronic bilateral low back pain with right-sided sciatica  Z98.1 (ICD-10-CM) - S/P lumbar fusion  R29.6 (ICD-10-CM) - Frequent fall   Rationale for Evaluation and Treatment: Rehabilitation  THERAPY DIAG:  Difficulty in walking, not elsewhere classified  Muscle weakness (generalized)  Repeated falls  Unsteadiness on feet  Localized edema  Acute pain of right knee  ONSET DATE: 11/24/22  SUBJECTIVE:  SUBJECTIVE STATEMENT: Patient reports no new issues, performing HEP.  PERTINENT HISTORY:  Per referring physician note: PMHx: hyperlipidemia, HTN, lumbar radiculopathy with fusion, 2020, R TKR 5/22  Chronic bilateral low back pain with sciatica       In light of the recent falls, I will refer her to PT for strengthening and balance training.      PAIN:  Are you having pain? Yes: NPRS scale: 4/10 Pain location: Low back Pain description: numbness and tingling into R toes Aggravating factors: nothing specific, steps, walking long er distances-grocery storer Relieving factors: Stopping whatever task she is performing,   PRECAUTIONS: Back and Fall  RED FLAGS: None   WEIGHT BEARING RESTRICTIONS: No  FALLS:  Has patient fallen in last 6 months? Yes. Number of falls 3 she feels like her R knee gave way.  LIVING ENVIRONMENT: Lives with: lives alone Lives in: House/apartment Stairs: Yes: Internal: 12 steps; on right going up Has following equipment at home: Single point cane and Walker - 2 wheeled  OCCUPATION: Out of work since 2022 due to R knee giving out. Was working at [employer], had to stand, walk, bend over, tec.  PLOF: Independent  PATIENT GOALS: Patient reports that she would like to prevent falls and injury, possibly get back to some sort of  work  NEXT MD VISIT: 2 1/2 months  OBJECTIVE:  Note: Objective measures were completed at Evaluation unless otherwise noted.  DIAGNOSTIC FINDINGS:  Per lumbar MRI 03/02/21 IMPRESSION: 1. Status post posterior instrumented fusion at L4-L5 without evidence of complication. 2. Overall mild multilevel degenerative changes throughout the lumbar spine detailed above are not significantly changed since 2021, including mild bilateral subarticular zone and neural foraminal narrowing at L3-L4, mild left subarticular zone and mild left worse than right neural foraminal stenosis at L4-L5, and mild right subarticular zone narrowing at L5-S1, without evidence of nerve root impingement.  COGNITION: Overall cognitive status: Within functional limits for tasks assessed     SENSATION: Light touch: Impaired   MUSCLE LENGTH: Hamstrings: Right 40 deg; Left 45 deg Thomas test: B WFL  POSTURE: increased lumbar lordosis, anterior pelvic tilt, left pelvic obliquity, and weight shift left  PALPATION: Tight in lumbar paraspinals, B, TTP B piriformis and ITB, R gluts prox hamstring tendons  LUMBAR ROM:   AROM eval  Flexion Mid shin  Extension WNL  Right lateral flexion Mid thigh, P  Left lateral flexion Mid thigh, P  Right rotation 30%  Left rotation 60%   (Blank rows = not tested)  LOWER EXTREMITY ROM: BLE WFL, R ankle DF slightly limited.  LOWER EXTREMITY MMT:    MMT Right eval Left eval  Hip flexion 3- 4  Hip extension 3- 4  Hip abduction 3- 4  Hip adduction    Hip internal rotation    Hip external rotation    Knee flexion 3+ 4  Knee extension 4- 4  Ankle dorsiflexion 3+ 4  Ankle plantarflexion    Ankle inversion    Ankle eversion     (Blank rows = not tested)  LUMBAR SPECIAL TESTS:  Straight leg raise test: Negative and Single leg stance test: Positive, slump test neg  FUNCTIONAL TESTS:  5 times sit to stand: 25.41 Timed up and go (TUG): 19.09  GAIT: Distance walked:  In clinic distances Assistive device utilized: None Level of assistance: Modified independence Comments: Increased L lateral sway, decreased WB and stance on R, unsteady, slow and labored.  TODAY'S TREATMENT:  DATE:  12/23/22 NuStep L4 x 6 minutes Leg press, 40# 2 x 10 with BLE Standing shoulder ext and rows, 15#, 2 x 10 Seated shoulder ER with G tband 2 x 10 Seated knee flex, 35#, BLE 2 x 10 reps, 20#, 2 x 10 with each leg. Seated knee ext, 5#, BLE 2 x 10 then eccentric ext x 10 on each leg.  12/21/22 Supine stretch with physioball- DKTC, LTR Supine strengthening- Quick side to side roll, bridge Seated abdominal isometric press into ball in lap. Seated forward flexion over physioball to stretch low back, x 5 forward and to each side. Seated knee flexion against yellow tband x 10 reps Seated knee ext against Yellow Tband x 10 reps each leg  12/17/22 NuStep L5 x 6 minutes R knee PROM  LAQ RLE 2lb 2x10 HS curls yellow RLE 2x10 S2S 2x10 slightly elevated mat  Seated Rows & Lats 2x10 Shoulder Ext 5lb 2x10 RLE step usp x 10 RLE SLR x5 last three reps eccentrics   12/14/22 NuStep L5 x 6 minutes Supine stretching- SKTC, DKTC, piriformis, LTR Supine strengthening- PPT and hold, clamshells with G tband, bridging x 10 each, R SLR 5 reps, SAQ- too easy on R Seated LAQ with 5 sec hold x 10 reps Seated HS stretch  12/09/22 Education    PATIENT EDUCATION:  Education details: POC Person educated: Patient Education method: Explanation Education comprehension: verbalized understanding  HOME EXERCISE PROGRAM:  [id number]  ASSESSMENT:  CLINICAL IMPRESSION: Pt enters with no new issues. She reports that some of the stretches hurt her R knee. Encouraged to perform as tolerated and if they aggravate knee avoid the ones that do. Treatment emphasized  strengthening, attempting po identify exercises that will build strength and stability without hurting her knee. She tolerated increased resistance with no reports of pain.  OBJECTIVE IMPAIRMENTS: Abnormal gait, decreased activity tolerance, decreased balance, decreased coordination, decreased endurance, difficulty walking, decreased ROM, decreased strength, impaired flexibility, improper body mechanics, postural dysfunction, and pain.   ACTIVITY LIMITATIONS: carrying, lifting, bending, sitting, standing, squatting, sleeping, stairs, transfers, and locomotion level  PARTICIPATION LIMITATIONS: meal prep, cleaning, laundry, driving, shopping, community activity, and occupation  PERSONAL FACTORS: Past/current experiences are also affecting patient's functional outcome.   REHAB POTENTIAL: Good  CLINICAL DECISION MAKING: Stable/uncomplicated  EVALUATION COMPLEXITY: Moderate   GOALS: Goals reviewed with patient? Yes  SHORT TERM GOALS: Target date: 12/25/22  I with initial HEP Baseline: Goal status: 12/21/22 initiated, ongoing  LONG TERM GOALS: Target date: 02/17/23  I with final HEP Baseline:  Goal status: INITIAL  2.  Patient will recover lumbar ROM to at least 80% in all planes of movement with back pain < 3/10 Baseline:  Goal status: INITIAL  3.  Increase B LE strength to at least 4+/5. Baseline:  Goal status: 12/21/22- R knee 3+/5, ongoing  4.  Complete 5x STS in < 12 sec with equal WB Baseline:  Goal status: INITIAL  5.  Complete TUG in < 12 sec with no unsteadiness and normalized gait pattern Baseline:  Goal status: INITIAL  6.  Patient will ambulate at least 400' in 6 minute walk test, demonstrating normalized gait pattern, no unsteadiness Baseline:  Goal status: INITIAL  PLAN:  PT FREQUENCY: 2x/week  PT DURATION: 10 weeks  PLANNED INTERVENTIONS: 97110-Therapeutic exercises, 97530- Therapeutic activity, [id number]- Neuromuscular re-education, 97535- Self Care,  97140- Manual therapy, [id number]- Gait training, 54098- Electrical stimulation (unattended), [phone]- Ionotophoresis 4mg /ml Dexamethasone, Patient/Family education, Balance training, Stair training, Dry Needling, Joint mobilization,  Spinal mobilization, Cryotherapy, and Moist heat.  PLAN FOR NEXT SESSION: Lower body strengthening. R knee gives at times, B hips and R knee very weak.   Iona Beard, DPT 12/23/2022, 1:57 PM

## 2022-12-24 ENCOUNTER — Telehealth: Payer: Self-pay | Admitting: Registered Nurse

## 2022-12-24 DIAGNOSIS — M5416 Radiculopathy, lumbar region: Secondary | ICD-10-CM

## 2022-12-24 DIAGNOSIS — G894 Chronic pain syndrome: Secondary | ICD-10-CM

## 2022-12-24 LAB — TOXASSURE SELECT,+ANTIDEPR,UR

## 2022-12-24 MED ORDER — TRAMADOL HCL 50 MG PO TABS: 50.0000 mg | ORAL_TABLET | Freq: Two times a day (BID) | ORAL | 0 refills | Status: DC | PRN

## 2022-12-24 NOTE — Addendum Note (Signed)
Addended by: Jones Bales on: 12/24/2022 04:29 PM   Modules accepted: Orders

## 2022-12-24 NOTE — Telephone Encounter (Signed)
Mr. Tyrell is aware of Jessica Lam NP message. She will wait for Physical Therapy to call, an for evaluation appointment. Also she will check later at the pharmacy for the Rx.

## 2022-12-24 NOTE — Telephone Encounter (Signed)
PMP was Reviewed.  This provider spoke with Dr Berline Chough  Tramadol e-scribed, Ms. Yoss is aware of the above and verbalizes understanding.

## 2022-12-24 NOTE — Telephone Encounter (Signed)
UDS results was Reviewed.  Spoke with Dr Berline Chough regarding Medication regimen, also order was placed for [facility] evaluation, per discussion with Dr Berline Chough.  Call placed to [name], no answer. We will prescribe Tramadol, will await her return call.

## 2022-12-28 ENCOUNTER — Encounter: Payer: Self-pay | Admitting: Physical Therapy

## 2022-12-28 ENCOUNTER — Ambulatory Visit: Payer: Medicaid Other | Attending: Family Medicine | Admitting: Physical Therapy

## 2022-12-28 DIAGNOSIS — R2681 Unsteadiness on feet: Secondary | ICD-10-CM | POA: Diagnosis present

## 2022-12-28 DIAGNOSIS — M6281 Muscle weakness (generalized): Secondary | ICD-10-CM | POA: Diagnosis present

## 2022-12-28 DIAGNOSIS — R262 Difficulty in walking, not elsewhere classified: Secondary | ICD-10-CM | POA: Diagnosis present

## 2022-12-28 DIAGNOSIS — M25561 Pain in right knee: Secondary | ICD-10-CM

## 2022-12-28 DIAGNOSIS — R296 Repeated falls: Secondary | ICD-10-CM | POA: Diagnosis present

## 2022-12-28 DIAGNOSIS — R600 Localized edema: Secondary | ICD-10-CM | POA: Diagnosis present

## 2022-12-28 NOTE — Therapy (Signed)
OUTPATIENT PHYSICAL THERAPY THORACOLUMBAR EVALUATION   Patient Name: Jessica Huff MRN: [id number] DOB:[birth year], 62 y.o., female Today's Date: 12/28/2022  END OF SESSION:  PT End of Session - 12/28/22 1514     Visit Number 6    Date for PT Re-Evaluation 02/17/23    PT Start Time 1515    PT Stop Time 1600    PT Time Calculation (min) 45 min    Behavior During Therapy [facility] for tasks assessed/performed              Past Medical History:  Diagnosis Date   Asthma    GERD (gastroesophageal reflux disease)    Hyperlipidemia    Hypertension    Past Surgical History:  Procedure Laterality Date   ANKLE SURGERY Right 2018   LUMBAR FUSION  2020   L4-L5   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   Procedure: RIGHT TOTAL KNEE ARTHROPLASTY;  Surgeon: Nadara Mustard, MD;  Location: [facility] OR;  Service: Orthopedics;  Laterality: Right;   Patient Active Problem List   Diagnosis Date Noted   History of uterine fibroid 06/01/2022   Lumbar radiculitis 05/07/2022   Myofascial pain 04/08/2022   Total knee replacement status, right 06/25/2021   Acute pain of right knee 02/07/2021   Malaise and fatigue 02/07/2021   Prediabetes 10/19/2018   S/P lumbar fusion 09/16/2018   Asthma in adult, moderate persistent, uncomplicated 07/03/2016   Chronic pain of right ankle 03/02/2016   Allergic rhinitis 11/29/2015   Class 1 obesity due to excess calories with serious comorbidity and body mass index (BMI) of 32.0 to 32.9 in adult 11/29/2015   Benign positional vertigo 04/07/2015   Chronic bilateral low back pain with sciatica 04/07/2015   Common migraine 04/07/2015   Hyperlipidemia 04/07/2015   Essential hypertension 04/07/2015   Insomnia 04/07/2015   Obstructive sleep apnea 04/07/2015   Lumbar radiculopathy 04/07/2015   Anxiety 04/07/2015   PCP: Loyola Mast, MD  REFERRING PROVIDER: Loyola Mast, MD  REFERRING DIAG:  [phone] (ICD-10-CM) - Chronic bilateral low back pain with  right-sided sciatica  Z98.1 (ICD-10-CM) - S/P lumbar fusion  R29.6 (ICD-10-CM) - Frequent fall   Rationale for Evaluation and Treatment: Rehabilitation  THERAPY DIAG:  Difficulty in walking, not elsewhere classified  Muscle weakness (generalized)  Repeated falls  Unsteadiness on feet  Localized edema  Acute pain of right knee  ONSET DATE: 11/24/22  SUBJECTIVE:  SUBJECTIVE STATEMENT: "I feel pretty good"  PERTINENT HISTORY:  Per referring physician note: PMHx: hyperlipidemia, HTN, lumbar radiculopathy with fusion, 2020, R TKR 5/22  Chronic bilateral low back pain with sciatica       In light of the recent falls, I will refer her to PT for strengthening and balance training.      PAIN:  Are you having pain? Yes: NPRS scale: 4/10 Pain location: Low back Pain description: numbness and tingling into R toes Aggravating factors: nothing specific, steps, walking long er distances-grocery storer Relieving factors: Stopping whatever task she is performing,   PRECAUTIONS: Back and Fall  RED FLAGS: None   WEIGHT BEARING RESTRICTIONS: No  FALLS:  Has patient fallen in last 6 months? Yes. Number of falls 3 she feels like her R knee gave way.  LIVING ENVIRONMENT: Lives with: lives alone Lives in: House/apartment Stairs: Yes: Internal: 12 steps; on right going up Has following equipment at home: Single point cane and Walker - 2 wheeled  OCCUPATION: Out of work since 2022 due to R knee giving out. Was working at [employer], had to stand, walk, bend over, tec.  PLOF: Independent  PATIENT GOALS: Patient reports that she would like to prevent falls and injury, possibly get back to some sort of work  NEXT MD VISIT: 2 1/2 months  OBJECTIVE:  Note: Objective measures were completed at  Evaluation unless otherwise noted.  DIAGNOSTIC FINDINGS:  Per lumbar MRI 03/02/21 IMPRESSION: 1. Status post posterior instrumented fusion at L4-L5 without evidence of complication. 2. Overall mild multilevel degenerative changes throughout the lumbar spine detailed above are not significantly changed since 2021, including mild bilateral subarticular zone and neural foraminal narrowing at L3-L4, mild left subarticular zone and mild left worse than right neural foraminal stenosis at L4-L5, and mild right subarticular zone narrowing at L5-S1, without evidence of nerve root impingement.  COGNITION: Overall cognitive status: Within functional limits for tasks assessed     SENSATION: Light touch: Impaired   MUSCLE LENGTH: Hamstrings: Right 40 deg; Left 45 deg Thomas test: B WFL  POSTURE: increased lumbar lordosis, anterior pelvic tilt, left pelvic obliquity, and weight shift left  PALPATION: Tight in lumbar paraspinals, B, TTP B piriformis and ITB, R gluts prox hamstring tendons  LUMBAR ROM:   AROM eval  Flexion Mid shin  Extension WNL  Right lateral flexion Mid thigh, P  Left lateral flexion Mid thigh, P  Right rotation 30%  Left rotation 60%   (Blank rows = not tested)  LOWER EXTREMITY ROM: BLE WFL, R ankle DF slightly limited.  LOWER EXTREMITY MMT:    MMT Right eval Left eval  Hip flexion 3- 4  Hip extension 3- 4  Hip abduction 3- 4  Hip adduction    Hip internal rotation    Hip external rotation    Knee flexion 3+ 4  Knee extension 4- 4  Ankle dorsiflexion 3+ 4  Ankle plantarflexion    Ankle inversion    Ankle eversion     (Blank rows = not tested)  LUMBAR SPECIAL TESTS:  Straight leg raise test: Negative and Single leg stance test: Positive, slump test neg  FUNCTIONAL TESTS:  5 times sit to stand: 25.41 Timed up and go (TUG): 19.09  GAIT: Distance walked: In clinic distances Assistive device utilized: None Level of assistance: Modified  independence Comments: Increased L lateral sway, decreased WB and stance on R, unsteady, slow and labored.  TODAY'S TREATMENT:  DATE:  12/28/22 Bike L 3.5 x 6 min Resisted gait 30lb 4 way x 4 each S2S OHP yellow ball 2x10 Leg press RLE 20lb 2x10 Seated LAQ RLE x15  12/23/22 NuStep L4 x 6 minutes Leg press, 40# 2 x 10 with BLE Standing shoulder ext and rows, 15#, 2 x 10 Seated shoulder ER with G tband 2 x 10 Seated knee flex, 35#, BLE 2 x 10 reps, 20#, 2 x 10 with each leg. Seated knee ext, 5#, BLE 2 x 10 then eccentric ext x 10 on each leg.  12/21/22 Supine stretch with physioball- DKTC, LTR Supine strengthening- Quick side to side roll, bridge Seated abdominal isometric press into ball in lap. Seated forward flexion over physioball to stretch low back, x 5 forward and to each side. Seated knee flexion against yellow tband x 10 reps Seated knee ext against Yellow Tband x 10 reps each leg  12/17/22 NuStep L5 x 6 minutes R knee PROM  LAQ RLE 2lb 2x10 HS curls yellow RLE 2x10 S2S 2x10 slightly elevated mat  Seated Rows & Lats 2x10 Shoulder Ext 5lb 2x10 RLE step usp x 10 RLE SLR x5 last three reps eccentrics   12/14/22 NuStep L5 x 6 minutes Supine stretching- SKTC, DKTC, piriformis, LTR Supine strengthening- PPT and hold, clamshells with G tband, bridging x 10 each, R SLR 5 reps, SAQ- too easy on R Seated LAQ with 5 sec hold x 10 reps Seated HS stretch  12/09/22 Education    PATIENT EDUCATION:  Education details: POC Person educated: Patient Education method: Explanation Education comprehension: verbalized understanding  HOME EXERCISE PROGRAM:  [id number]  ASSESSMENT:  CLINICAL IMPRESSION: Pt enters with no new issues. Treatment emphasized strengthening of RLE. Pt ambulates with a rigid RLE not allowing RLE to bend. Pt is very weak with RLE  open chain interventions. No reports of pain during session. Cues for Appropriate gait mechanics given throughout session.   OBJECTIVE IMPAIRMENTS: Abnormal gait, decreased activity tolerance, decreased balance, decreased coordination, decreased endurance, difficulty walking, decreased ROM, decreased strength, impaired flexibility, improper body mechanics, postural dysfunction, and pain.   ACTIVITY LIMITATIONS: carrying, lifting, bending, sitting, standing, squatting, sleeping, stairs, transfers, and locomotion level  PARTICIPATION LIMITATIONS: meal prep, cleaning, laundry, driving, shopping, community activity, and occupation  PERSONAL FACTORS: Past/current experiences are also affecting patient's functional outcome.   REHAB POTENTIAL: Good  CLINICAL DECISION MAKING: Stable/uncomplicated  EVALUATION COMPLEXITY: Moderate   GOALS: Goals reviewed with patient? Yes  SHORT TERM GOALS: Target date: 12/25/22  I with initial HEP Baseline: Goal status: 12/28/22 Met  LONG TERM GOALS: Target date: 02/17/23  I with final HEP Baseline:  Goal status: INITIAL  2.  Patient will recover lumbar ROM to at least 80% in all planes of movement with back pain < 3/10 Baseline:  Goal status: INITIAL  3.  Increase B LE strength to at least 4+/5. Baseline:  Goal status: 12/21/22- R knee 3+/5, ongoing  4.  Complete 5x STS in < 12 sec with equal WB Baseline:  Goal status: INITIAL  5.  Complete TUG in < 12 sec with no unsteadiness and normalized gait pattern Baseline:  Goal status: INITIAL  6.  Patient will ambulate at least 400' in 6 minute walk test, demonstrating normalized gait pattern, no unsteadiness Baseline:  Goal status: INITIAL  PLAN:  PT FREQUENCY: 2x/week  PT DURATION: 10 weeks  PLANNED INTERVENTIONS: 97110-Therapeutic exercises, 97530- Therapeutic activity, [id number]- Neuromuscular re-education, 97535- Self Care, 40102- Manual therapy, [id number]- Gait training, [phone]- Electrical  stimulation (unattended), [phone]- Ionotophoresis 4mg /ml Dexamethasone, Patient/Family education, Balance training, Stair training, Dry Needling, Joint mobilization, Spinal mobilization, Cryotherapy, and Moist heat.  PLAN FOR NEXT SESSION: Lower body strengthening. R knee gives at times, B hips and R knee very weak.   Iona Beard, DPT 12/28/2022, 3:14 PM

## 2022-12-30 ENCOUNTER — Ambulatory Visit: Payer: Medicaid Other | Admitting: Physical Therapy

## 2022-12-30 ENCOUNTER — Encounter: Payer: Self-pay | Admitting: Physical Therapy

## 2022-12-30 DIAGNOSIS — R262 Difficulty in walking, not elsewhere classified: Secondary | ICD-10-CM | POA: Diagnosis not present

## 2022-12-30 DIAGNOSIS — M25561 Pain in right knee: Secondary | ICD-10-CM

## 2022-12-30 DIAGNOSIS — R600 Localized edema: Secondary | ICD-10-CM

## 2022-12-30 DIAGNOSIS — R296 Repeated falls: Secondary | ICD-10-CM

## 2022-12-30 DIAGNOSIS — M6281 Muscle weakness (generalized): Secondary | ICD-10-CM

## 2022-12-30 DIAGNOSIS — R2681 Unsteadiness on feet: Secondary | ICD-10-CM

## 2022-12-30 NOTE — Therapy (Signed)
OUTPATIENT PHYSICAL THERAPY THORACOLUMBAR EVALUATION   Patient Name: Jessica Huff MRN: [id number] DOB:07/08/1960, 62 y.o., female Today's Date: 12/30/2022  END OF SESSION:  PT End of Session - 12/30/22 1549     Visit Number 7    Date for PT Re-Evaluation 02/17/23    PT Start Time 1546    PT Stop Time 1625    PT Time Calculation (min) 39 min               Past Medical History:  Diagnosis Date   Asthma    GERD (gastroesophageal reflux disease)    Hyperlipidemia    Hypertension    Past Surgical History:  Procedure Laterality Date   ANKLE SURGERY Right 2018   LUMBAR FUSION  2020   L4-L5   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   Procedure: RIGHT TOTAL KNEE ARTHROPLASTY;  Surgeon: Nadara Mustard, MD;  Location: [facility] OR;  Service: Orthopedics;  Laterality: Right;   Patient Active Problem List   Diagnosis Date Noted   History of uterine fibroid 06/01/2022   Lumbar radiculitis 05/07/2022   Myofascial pain 04/08/2022   Total knee replacement status, right 06/25/2021   Acute pain of right knee 02/07/2021   Malaise and fatigue 02/07/2021   Prediabetes 10/19/2018   S/P lumbar fusion 09/16/2018   Asthma in adult, moderate persistent, uncomplicated 07/03/2016   Chronic pain of right ankle 03/02/2016   Allergic rhinitis 11/29/2015   Class 1 obesity due to excess calories with serious comorbidity and body mass index (BMI) of 32.0 to 32.9 in adult 11/29/2015   Benign positional vertigo 04/07/2015   Chronic bilateral low back pain with sciatica 04/07/2015   Common migraine 04/07/2015   Hyperlipidemia 04/07/2015   Essential hypertension 04/07/2015   Insomnia 04/07/2015   Obstructive sleep apnea 04/07/2015   Lumbar radiculopathy 04/07/2015   Anxiety 04/07/2015   PCP: Loyola Mast, MD  REFERRING PROVIDER: Loyola Mast, MD  REFERRING DIAG:  [phone] (ICD-10-CM) - Chronic bilateral low back pain with right-sided sciatica  Z98.1 (ICD-10-CM) - S/P lumbar fusion   R29.6 (ICD-10-CM) - Frequent fall   Rationale for Evaluation and Treatment: Rehabilitation  THERAPY DIAG:  Difficulty in walking, not elsewhere classified  Muscle weakness (generalized)  Repeated falls  Unsteadiness on feet  Localized edema  Acute pain of right knee  ONSET DATE: 11/24/22  SUBJECTIVE:                                                                                                                                                                                           SUBJECTIVE STATEMENT: Patient reports a bit of pain in  her back and RLE after last treatment remained the next day.  PERTINENT HISTORY:  Per referring physician note: PMHx: hyperlipidemia, HTN, lumbar radiculopathy with fusion, 2020, R TKR 5/22  Chronic bilateral low back pain with sciatica       In light of the recent falls, I will refer her to PT for strengthening and balance training.      PAIN:  Are you having pain? Yes: NPRS scale: 4/10 Pain location: Low back Pain description: numbness and tingling into R toes Aggravating factors: nothing specific, steps, walking long er distances-grocery storer Relieving factors: Stopping whatever task she is performing,   PRECAUTIONS: Back and Fall  RED FLAGS: None   WEIGHT BEARING RESTRICTIONS: No  FALLS:  Has patient fallen in last 6 months? Yes. Number of falls 3 she feels like her R knee gave way.  LIVING ENVIRONMENT: Lives with: lives alone Lives in: House/apartment Stairs: Yes: Internal: 12 steps; on right going up Has following equipment at home: Single point cane and Walker - 2 wheeled  OCCUPATION: Out of work since 2022 due to R knee giving out. Was working at [employer], had to stand, walk, bend over, tec.  PLOF: Independent  PATIENT GOALS: Patient reports that she would like to prevent falls and injury, possibly get back to some sort of work  NEXT MD VISIT: 2 1/2 months  OBJECTIVE:  Note: Objective measures were  completed at Evaluation unless otherwise noted.  DIAGNOSTIC FINDINGS:  Per lumbar MRI 03/02/21 IMPRESSION: 1. Status post posterior instrumented fusion at L4-L5 without evidence of complication. 2. Overall mild multilevel degenerative changes throughout the lumbar spine detailed above are not significantly changed since 2021, including mild bilateral subarticular zone and neural foraminal narrowing at L3-L4, mild left subarticular zone and mild left worse than right neural foraminal stenosis at L4-L5, and mild right subarticular zone narrowing at L5-S1, without evidence of nerve root impingement.  COGNITION: Overall cognitive status: Within functional limits for tasks assessed     SENSATION: Light touch: Impaired   MUSCLE LENGTH: Hamstrings: Right 40 deg; Left 45 deg Thomas test: B WFL  POSTURE: increased lumbar lordosis, anterior pelvic tilt, left pelvic obliquity, and weight shift left  PALPATION: Tight in lumbar paraspinals, B, TTP B piriformis and ITB, R gluts prox hamstring tendons  LUMBAR ROM:   AROM eval  Flexion Mid shin  Extension WNL  Right lateral flexion Mid thigh, P  Left lateral flexion Mid thigh, P  Right rotation 30%  Left rotation 60%   (Blank rows = not tested)  LOWER EXTREMITY ROM: BLE WFL, R ankle DF slightly limited.  LOWER EXTREMITY MMT:    MMT Right eval Left eval  Hip flexion 3- 4  Hip extension 3- 4  Hip abduction 3- 4  Hip adduction    Hip internal rotation    Hip external rotation    Knee flexion 3+ 4  Knee extension 4- 4  Ankle dorsiflexion 3+ 4  Ankle plantarflexion    Ankle inversion    Ankle eversion     (Blank rows = not tested)  LUMBAR SPECIAL TESTS:  Straight leg raise test: Negative and Single leg stance test: Positive, slump test neg  FUNCTIONAL TESTS:  5 times sit to stand: 25.41 Timed up and go (TUG): 19.09  GAIT: Distance walked: In clinic distances Assistive device utilized: None Level of assistance:  Modified independence Comments: Increased L lateral sway, decreased WB and stance on R, unsteady, slow and labored.  TODAY'S TREATMENT:  DATE:  12/30/22 Bike L3 x 6 minutes Seated knee flexion 35#, BLE, 2 x 10 reps, 20#, LLE, 15# RLE, 2 x 10 each Seated knee ext 10#, BLE, 2 x 10 reps, Then eccentric 5#, 2 x 10 each leg Alternating step taps on 4" step, no UE support, 10 each Standing hip abd against Red Tband resistance, 4 x 5 reps each leg, with BUE support on RW Seated flexion over physioball x 10 forward and to each side.  12/28/22 Bike L 3.5 x 6 min Resisted gait 30lb 4 way x 4 each S2S OHP yellow ball 2x10 Leg press RLE 20lb 2x10 Seated LAQ RLE x15  12/23/22 NuStep L4 x 6 minutes Leg press, 40# 2 x 10 with BLE Standing shoulder ext and rows, 15#, 2 x 10 Seated shoulder ER with G tband 2 x 10 Seated knee flex, 35#, BLE 2 x 10 reps, 20#, 2 x 10 with each leg. Seated knee ext, 5#, BLE 2 x 10 then eccentric ext x 10 on each leg.  12/21/22 Supine stretch with physioball- DKTC, LTR Supine strengthening- Quick side to side roll, bridge Seated abdominal isometric press into ball in lap. Seated forward flexion over physioball to stretch low back, x 5 forward and to each side. Seated knee flexion against yellow tband x 10 reps Seated knee ext against Yellow Tband x 10 reps each leg  12/17/22 NuStep L5 x 6 minutes R knee PROM  LAQ RLE 2lb 2x10 HS curls yellow RLE 2x10 S2S 2x10 slightly elevated mat  Seated Rows & Lats 2x10 Shoulder Ext 5lb 2x10 RLE step usp x 10 RLE SLR x5 last three reps eccentrics   12/14/22 NuStep L5 x 6 minutes Supine stretching- SKTC, DKTC, piriformis, LTR Supine strengthening- PPT and hold, clamshells with G tband, bridging x 10 each, R SLR 5 reps, SAQ- too easy on R Seated LAQ with 5 sec hold x 10 reps Seated HS  stretch  12/09/22 Education    PATIENT EDUCATION:  Education details: POC Person educated: Patient Education method: Explanation Education comprehension: verbalized understanding  HOME EXERCISE PROGRAM:  [id number]  ASSESSMENT:  CLINICAL IMPRESSION: Pt enters with no new issues. Treatment emphasized strengthening of RLE. And trunk stability. She tolerated isolated muscle strengthening as well as functional strengthening with no reports of pain today.   OBJECTIVE IMPAIRMENTS: Abnormal gait, decreased activity tolerance, decreased balance, decreased coordination, decreased endurance, difficulty walking, decreased ROM, decreased strength, impaired flexibility, improper body mechanics, postural dysfunction, and pain.   ACTIVITY LIMITATIONS: carrying, lifting, bending, sitting, standing, squatting, sleeping, stairs, transfers, and locomotion level  PARTICIPATION LIMITATIONS: meal prep, cleaning, laundry, driving, shopping, community activity, and occupation  PERSONAL FACTORS: Past/current experiences are also affecting patient's functional outcome.   REHAB POTENTIAL: Good  CLINICAL DECISION MAKING: Stable/uncomplicated  EVALUATION COMPLEXITY: Moderate   GOALS: Goals reviewed with patient? Yes  SHORT TERM GOALS: Target date: 12/25/22  I with initial HEP Baseline: Goal status: 12/28/22 Met  LONG TERM GOALS: Target date: 02/17/23  I with final HEP Baseline:  Goal status: INITIAL  2.  Patient will recover lumbar ROM to at least 80% in all planes of movement with back pain < 3/10 Baseline:  Goal status: 12/30/22-ROM slightly improved, up to 50%, but still having some pain. Ongoing  3.  Increase B LE strength to at least 4+/5. Baseline:  Goal status: 12/21/22- R knee 3+/5, ongoing  4.  Complete 5x STS in < 12 sec with equal WB Baseline:  Goal status: INITIAL  5.  Complete TUG in < 12 sec with no unsteadiness and normalized gait pattern Baseline:  Goal status:  INITIAL  6.  Patient will ambulate at least 400' in 6 minute walk test, demonstrating normalized gait pattern, no unsteadiness Baseline:  Goal status: INITIAL  PLAN:  PT FREQUENCY: 2x/week  PT DURATION: 10 weeks  PLANNED INTERVENTIONS: 97110-Therapeutic exercises, 97530- Therapeutic activity, 97112- Neuromuscular re-education, 97535- Self Care, 16109- Manual therapy, [id number]- Gait training, 97014- Electrical stimulation (unattended), [phone]- Ionotophoresis 4mg /ml Dexamethasone, Patient/Family education, Balance training, Stair training, Dry Needling, Joint mobilization, Spinal mobilization, Cryotherapy, and Moist heat.  PLAN FOR NEXT SESSION: Lower body strengthening. R knee gives at times, B hips and R knee very weak.   Iona Beard, DPT 12/30/2022, 4:28 PM

## 2023-01-04 ENCOUNTER — Ambulatory Visit: Payer: Medicaid Other | Admitting: Physical Therapy

## 2023-01-04 ENCOUNTER — Encounter: Payer: Self-pay | Admitting: Physical Therapy

## 2023-01-04 DIAGNOSIS — R296 Repeated falls: Secondary | ICD-10-CM

## 2023-01-04 DIAGNOSIS — R2681 Unsteadiness on feet: Secondary | ICD-10-CM

## 2023-01-04 DIAGNOSIS — M6281 Muscle weakness (generalized): Secondary | ICD-10-CM

## 2023-01-04 DIAGNOSIS — R262 Difficulty in walking, not elsewhere classified: Secondary | ICD-10-CM | POA: Diagnosis not present

## 2023-01-04 NOTE — Therapy (Signed)
OUTPATIENT PHYSICAL THERAPY THORACOLUMBAR EVALUATION   Patient Name: Railee Kesecker MRN: [id number] DOB:15-Dec-1960, 62 y.o., female Today's Date: 01/04/2023  END OF SESSION:  PT End of Session - 01/04/23 1510     Visit Number 8    Date for PT Re-Evaluation 02/17/23    PT Start Time 1515    PT Stop Time 1600    PT Time Calculation (min) 45 min    Activity Tolerance Patient tolerated treatment well    Behavior During Therapy [facility] for tasks assessed/performed               Past Medical History:  Diagnosis Date   Asthma    GERD (gastroesophageal reflux disease)    Hyperlipidemia    Hypertension    Past Surgical History:  Procedure Laterality Date   ANKLE SURGERY Right 2018   LUMBAR FUSION  2020   L4-L5   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   Procedure: RIGHT TOTAL KNEE ARTHROPLASTY;  Surgeon: Nadara Mustard, MD;  Location: [facility] OR;  Service: Orthopedics;  Laterality: Right;   Patient Active Problem List   Diagnosis Date Noted   History of uterine fibroid 06/01/2022   Lumbar radiculitis 05/07/2022   Myofascial pain 04/08/2022   Total knee replacement status, right 06/25/2021   Acute pain of right knee 02/07/2021   Malaise and fatigue 02/07/2021   Prediabetes 10/19/2018   S/P lumbar fusion 09/16/2018   Asthma in adult, moderate persistent, uncomplicated 07/03/2016   Chronic pain of right ankle 03/02/2016   Allergic rhinitis 11/29/2015   Class 1 obesity due to excess calories with serious comorbidity and body mass index (BMI) of 32.0 to 32.9 in adult 11/29/2015   Benign positional vertigo 04/07/2015   Chronic bilateral low back pain with sciatica 04/07/2015   Common migraine 04/07/2015   Hyperlipidemia 04/07/2015   Essential hypertension 04/07/2015   Insomnia 04/07/2015   Obstructive sleep apnea 04/07/2015   Lumbar radiculopathy 04/07/2015   Anxiety 04/07/2015   PCP: Loyola Mast, MD  REFERRING PROVIDER: Loyola Mast, MD  REFERRING DIAG:   [phone] (ICD-10-CM) - Chronic bilateral low back pain with right-sided sciatica  Z98.1 (ICD-10-CM) - S/P lumbar fusion  R29.6 (ICD-10-CM) - Frequent fall   Rationale for Evaluation and Treatment: Rehabilitation  THERAPY DIAG:  Muscle weakness (generalized)  Repeated falls  Difficulty in walking, not elsewhere classified  Unsteadiness on feet  ONSET DATE: 11/24/22  SUBJECTIVE:  SUBJECTIVE STATEMENT: Good, about the same pain after last session  PERTINENT HISTORY:  Per referring physician note: PMHx: hyperlipidemia, HTN, lumbar radiculopathy with fusion, 2020, R TKR 5/22  Chronic bilateral low back pain with sciatica       In light of the recent falls, I will refer her to PT for strengthening and balance training.      PAIN:  Are you having pain? Yes: NPRS scale: 4/10 Pain location: Low back Pain description: numbness and tingling into R toes Aggravating factors: nothing specific, steps, walking long er distances-grocery storer Relieving factors: Stopping whatever task she is performing,   PRECAUTIONS: Back and Fall  RED FLAGS: None   WEIGHT BEARING RESTRICTIONS: No  FALLS:  Has patient fallen in last 6 months? Yes. Number of falls 3 she feels like her R knee gave way.  LIVING ENVIRONMENT: Lives with: lives alone Lives in: House/apartment Stairs: Yes: Internal: 12 steps; on right going up Has following equipment at home: Single point cane and Walker - 2 wheeled  OCCUPATION: Out of work since 2022 due to R knee giving out. Was working at [employer], had to stand, walk, bend over, tec.  PLOF: Independent  PATIENT GOALS: Patient reports that she would like to prevent falls and injury, possibly get back to some sort of work  NEXT MD VISIT: 2 1/2 months  OBJECTIVE:   Note: Objective measures were completed at Evaluation unless otherwise noted.  DIAGNOSTIC FINDINGS:  Per lumbar MRI 03/02/21 IMPRESSION: 1. Status post posterior instrumented fusion at L4-L5 without evidence of complication. 2. Overall mild multilevel degenerative changes throughout the lumbar spine detailed above are not significantly changed since 2021, including mild bilateral subarticular zone and neural foraminal narrowing at L3-L4, mild left subarticular zone and mild left worse than right neural foraminal stenosis at L4-L5, and mild right subarticular zone narrowing at L5-S1, without evidence of nerve root impingement.  COGNITION: Overall cognitive status: Within functional limits for tasks assessed     SENSATION: Light touch: Impaired   MUSCLE LENGTH: Hamstrings: Right 40 deg; Left 45 deg Thomas test: B WFL  POSTURE: increased lumbar lordosis, anterior pelvic tilt, left pelvic obliquity, and weight shift left  PALPATION: Tight in lumbar paraspinals, B, TTP B piriformis and ITB, R gluts prox hamstring tendons  LUMBAR ROM:   AROM eval  Flexion Mid shin  Extension WNL  Right lateral flexion Mid thigh, P  Left lateral flexion Mid thigh, P  Right rotation 30%  Left rotation 60%   (Blank rows = not tested)  LOWER EXTREMITY ROM: BLE WFL, R ankle DF slightly limited.  LOWER EXTREMITY MMT:    MMT Right eval Left eval  Hip flexion 3- 4  Hip extension 3- 4  Hip abduction 3- 4  Hip adduction    Hip internal rotation    Hip external rotation    Knee flexion 3+ 4  Knee extension 4- 4  Ankle dorsiflexion 3+ 4  Ankle plantarflexion    Ankle inversion    Ankle eversion     (Blank rows = not tested)  LUMBAR SPECIAL TESTS:  Straight leg raise test: Negative and Single leg stance test: Positive, slump test neg  FUNCTIONAL TESTS:  5 times sit to stand: 25.41 Timed up and go (TUG): 19.09  GAIT: Distance walked: In clinic distances Assistive device utilized:  None Level of assistance: Modified independence Comments: Increased L lateral sway, decreased WB and stance on R, unsteady, slow and labored.  TODAY'S TREATMENT:  DATE:  01/04/23 NuStep L5 x 6 min LAQ 5lb RLE 2x10 Seated march RLE 5lb 2x10 SAQ RLE 5lb 2x10 Bridges 2x10 LE on Pball bridges, oblq, K2C RLE HS curls green 2x10  S2S holding yellow ball 2x10   12/30/22 Bike L3 x 6 minutes Seated knee flexion 35#, BLE, 2 x 10 reps, 20#, LLE, 15# RLE, 2 x 10 each Seated knee ext 10#, BLE, 2 x 10 reps, Then eccentric 5#, 2 x 10 each leg Alternating step taps on 4" step, no UE support, 10 each Standing hip abd against Red Tband resistance, 4 x 5 reps each leg, with BUE support on RW Seated flexion over physioball x 10 forward and to each side.  12/28/22 Bike L 3.5 x 6 min Resisted gait 30lb 4 way x 4 each S2S OHP yellow ball 2x10 Leg press RLE 20lb 2x10 Seated LAQ RLE x15  12/23/22 NuStep L4 x 6 minutes Leg press, 40# 2 x 10 with BLE Standing shoulder ext and rows, 15#, 2 x 10 Seated shoulder ER with G tband 2 x 10 Seated knee flex, 35#, BLE 2 x 10 reps, 20#, 2 x 10 with each leg. Seated knee ext, 5#, BLE 2 x 10 then eccentric ext x 10 on each leg.  12/21/22 Supine stretch with physioball- DKTC, LTR Supine strengthening- Quick side to side roll, bridge Seated abdominal isometric press into ball in lap. Seated forward flexion over physioball to stretch low back, x 5 forward and to each side. Seated knee flexion against yellow tband x 10 reps Seated knee ext against Yellow Tband x 10 reps each leg  12/17/22 NuStep L5 x 6 minutes R knee PROM  LAQ RLE 2lb 2x10 HS curls yellow RLE 2x10 S2S 2x10 slightly elevated mat  Seated Rows & Lats 2x10 Shoulder Ext 5lb 2x10 RLE step usp x 10 RLE SLR x5 last three reps eccentrics   12/14/22 NuStep L5 x 6  minutes Supine stretching- SKTC, DKTC, piriformis, LTR Supine strengthening- PPT and hold, clamshells with G tband, bridging x 10 each, R SLR 5 reps, SAQ- too easy on R Seated LAQ with 5 sec hold x 10 reps Seated HS stretch  12/09/22 Education    PATIENT EDUCATION:  Education details: POC Person educated: Patient Education method: Explanation Education comprehension: verbalized understanding  HOME EXERCISE PROGRAM:  [id number]  ASSESSMENT:  CLINICAL IMPRESSION: Pt enters with no new issues. Again treatment emphasized strengthening of RLE, and trunk stability. She tolerated isolated muscle strengthening as well as functional strengthening with no reports of pain today. RLE dos fatigue quick with all isolation interventions.   OBJECTIVE IMPAIRMENTS: Abnormal gait, decreased activity tolerance, decreased balance, decreased coordination, decreased endurance, difficulty walking, decreased ROM, decreased strength, impaired flexibility, improper body mechanics, postural dysfunction, and pain.   ACTIVITY LIMITATIONS: carrying, lifting, bending, sitting, standing, squatting, sleeping, stairs, transfers, and locomotion level  PARTICIPATION LIMITATIONS: meal prep, cleaning, laundry, driving, shopping, community activity, and occupation  PERSONAL FACTORS: Past/current experiences are also affecting patient's functional outcome.   REHAB POTENTIAL: Good  CLINICAL DECISION MAKING: Stable/uncomplicated  EVALUATION COMPLEXITY: Moderate   GOALS: Goals reviewed with patient? Yes  SHORT TERM GOALS: Target date: 12/25/22  I with initial HEP Baseline: Goal status: 12/28/22 Met  LONG TERM GOALS: Target date: 02/17/23  I with final HEP Baseline:  Goal status: INITIAL  2.  Patient will recover lumbar ROM to at least 80% in all planes of movement with back pain < 3/10 Baseline:  Goal status: 12/30/22-ROM slightly improved,  up to 50%, but still having some pain. Ongoing  3.  Increase B LE  strength to at least 4+/5. Baseline:  Goal status: 12/21/22- R knee 3+/5, ongoing  4.  Complete 5x STS in < 12 sec with equal WB Baseline:  Goal status: INITIAL  5.  Complete TUG in < 12 sec with no unsteadiness and normalized gait pattern Baseline:  Goal status: INITIAL  6.  Patient will ambulate at least 400' in 6 minute walk test, demonstrating normalized gait pattern, no unsteadiness Baseline:  Goal status: INITIAL  PLAN:  PT FREQUENCY: 2x/week  PT DURATION: 10 weeks  PLANNED INTERVENTIONS: 97110-Therapeutic exercises, 97530- Therapeutic activity, [id number]- Neuromuscular re-education, 97535- Self Care, [id number]- Manual therapy, [id number]- Gait training, 97014- Electrical stimulation (unattended), [phone]- Ionotophoresis 4mg /ml Dexamethasone, Patient/Family education, Balance training, Stair training, Dry Needling, Joint mobilization, Spinal mobilization, Cryotherapy, and Moist heat.  PLAN FOR NEXT SESSION: Lower body strengthening. R knee gives at times, B hips and R knee very weak.   Debroah Baller, PTA 01/04/2023, 3:11 PM

## 2023-01-06 ENCOUNTER — Ambulatory Visit: Payer: Medicaid Other | Admitting: Physical Therapy

## 2023-01-06 ENCOUNTER — Encounter: Payer: Self-pay | Admitting: Physical Medicine and Rehabilitation

## 2023-01-06 ENCOUNTER — Encounter: Payer: Self-pay | Admitting: Physical Therapy

## 2023-01-06 ENCOUNTER — Encounter
Payer: Medicaid Other | Attending: Physical Medicine and Rehabilitation | Admitting: Physical Medicine and Rehabilitation

## 2023-01-06 VITALS — BP 126/82 | HR 76 | Ht 72.0 in | Wt 236.4 lb

## 2023-01-06 DIAGNOSIS — R2681 Unsteadiness on feet: Secondary | ICD-10-CM

## 2023-01-06 DIAGNOSIS — M7918 Myalgia, other site: Secondary | ICD-10-CM | POA: Diagnosis not present

## 2023-01-06 DIAGNOSIS — R600 Localized edema: Secondary | ICD-10-CM

## 2023-01-06 DIAGNOSIS — R262 Difficulty in walking, not elsewhere classified: Secondary | ICD-10-CM | POA: Diagnosis not present

## 2023-01-06 DIAGNOSIS — M5416 Radiculopathy, lumbar region: Secondary | ICD-10-CM | POA: Insufficient documentation

## 2023-01-06 DIAGNOSIS — R296 Repeated falls: Secondary | ICD-10-CM

## 2023-01-06 DIAGNOSIS — M6281 Muscle weakness (generalized): Secondary | ICD-10-CM

## 2023-01-06 MED ORDER — TRAMADOL HCL 50 MG PO TABS: 50.0000 mg | ORAL_TABLET | Freq: Two times a day (BID) | ORAL | 5 refills | Status: DC | PRN

## 2023-01-06 MED ORDER — LIDOCAINE HCL 1 % IJ SOLN
6.0000 mL | Freq: Once | INTRAMUSCULAR | Status: AC
Start: 2023-01-06 — End: 2023-01-06
  Administered 2023-01-06: 6 mL

## 2023-01-06 NOTE — Patient Instructions (Signed)
  Plan: Send in Tramadol again since only got 1 week supply from last Rx- 10 pills actually per pt- don't have bottle- will send in for 1 months supply at a time-   2. Stopped Lyrica- stopped due to LE swelling- took off list.    3. Patient here for trigger point injections for  Consent done and on chart.  Cleaned areas with alcohol and injected using a 27 gauge 1.5 inch needle  Injected  6cc none wasted Using 1% Lidocaine with no EPI  Upper traps B/L  Levators b/L  Posterior scalenes Middle scalenes- B?L Splenius Capitus Pectoralis Major- B/L  Rhomboids- B/L  Infraspinatus Teres Major/minor Thoracic paraspinals Lumbar paraspinals- B/L x2 Other injections- R thigh- lateral   Patient's level of pain prior was 4/10 Current level of pain after injections is- the same so far  There was no bleeding or complications.  Patient was advised to drink a lot of water on day after injections to flush system Will have increased soreness for 12-48 hours after injections.  Can use Lidocaine patches the day AFTER injections Can use theracane on day of injections in places didn't inject Can use heating pad 4-6 hours AFTER injections  4. Con't PT- you need 5 days/week= home exercise so total 5 days/week   5. F/U in 3 months-

## 2023-01-06 NOTE — Progress Notes (Signed)
Pt is a 62 yr old female with hx of asthma, prediabetes; HTN, BMI of 32; and chronic low back pain with sciatica as well as hx of lumbar fusion- L4/5 had surgery for back pain. Here for evaluation of back pain s/p L4/5 back fusion and new RLE weakness.  Here for f/u on back pain/RLE weakness. And trigger point injections.       Dr Veto Kemps filled out paperwork for Disability- asking if I need to or not.   Doing PT- going 2x/week- M/W.   Not sure if helping or not.  Still in pain.   Tramadol- didn't bring bottle- only takes (last taken Sunday)- taking a few times per week.  Only got 10 pills- but it helps- when takes it.   Helps a lot when takes it- might help more if could take more times per day.   Hasn't fallen lately- even though still dragging RLE>    TrP injections usually last ~ 6 weeks for pt- from August til late September-  But drove a long drive that might have made it stop earlier than normal.   Plan: Send in Tramadol again since only got 1 week supply from last Rx- 10 pills actually per pt- don't have bottle- will send in for 1 months supply at a time-   2. Stopped Lyrica- stopped due to LE swelling- took off list.    3. Patient here for trigger point injections for  Consent done and on chart.  Cleaned areas with alcohol and injected using a 27 gauge 1.5 inch needle  Injected  6cc none wasted Using 1% Lidocaine with no EPI  Upper traps B/L  Levators b/L  Posterior scalenes Middle scalenes- B?L Splenius Capitus Pectoralis Major- B/L  Rhomboids- B/L  Infraspinatus Teres Major/minor Thoracic paraspinals Lumbar paraspinals- B/L x2 Other injections- R thigh- lateral   Patient's level of pain prior was 4/10 Current level of pain after injections is- the same so far  There was no bleeding or complications.  Patient was advised to drink a lot of water on day after injections to flush system Will have increased soreness for 12-48 hours after injections.  Can use  Lidocaine patches the day AFTER injections Can use theracane on day of injections in places didn't inject Can use heating pad 4-6 hours AFTER injections  4. Con't PT- you need 5 days/week= home exercise so total 5 days/week   5. F/U in 3 months-      I spent a total of  26  minutes on total care today- >50% coordination of care- due to 6 minutes on TrP injections and rest discussing tramadol, therapy, HEP- and prior auth

## 2023-01-06 NOTE — Therapy (Signed)
OUTPATIENT PHYSICAL THERAPY THORACOLUMBAR EVALUATION   Patient Name: Jessica Huff MRN: [id number] DOB:Jul 07, 1960, 62 y.o., female Today's Date: 01/06/2023  END OF SESSION:  PT End of Session - 01/06/23 1518     Visit Number 9    Date for PT Re-Evaluation 02/17/23    PT Start Time 1516    PT Stop Time 1600    PT Time Calculation (min) 44 min    Activity Tolerance Patient tolerated treatment well    Behavior During Therapy [facility] for tasks assessed/performed               Past Medical History:  Diagnosis Date   Asthma    GERD (gastroesophageal reflux disease)    Hyperlipidemia    Hypertension    Past Surgical History:  Procedure Laterality Date   ANKLE SURGERY Right 2018   LUMBAR FUSION  2020   L4-L5   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   Procedure: RIGHT TOTAL KNEE ARTHROPLASTY;  Surgeon: Nadara Mustard, MD;  Location: [facility] OR;  Service: Orthopedics;  Laterality: Right;   Patient Active Problem List   Diagnosis Date Noted   History of uterine fibroid 06/01/2022   Lumbar radiculitis 05/07/2022   Myofascial pain 04/08/2022   Total knee replacement status, right 06/25/2021   Acute pain of right knee 02/07/2021   Malaise and fatigue 02/07/2021   Prediabetes 10/19/2018   S/P lumbar fusion 09/16/2018   Asthma in adult, moderate persistent, uncomplicated 07/03/2016   Chronic pain of right ankle 03/02/2016   Allergic rhinitis 11/29/2015   Class 1 obesity due to excess calories with serious comorbidity and body mass index (BMI) of 32.0 to 32.9 in adult 11/29/2015   Benign positional vertigo 04/07/2015   Chronic bilateral low back pain with sciatica 04/07/2015   Common migraine 04/07/2015   Hyperlipidemia 04/07/2015   Essential hypertension 04/07/2015   Insomnia 04/07/2015   Obstructive sleep apnea 04/07/2015   Lumbar radiculopathy 04/07/2015   Anxiety 04/07/2015   PCP: Loyola Mast, MD  REFERRING PROVIDER: Loyola Mast, MD  REFERRING DIAG:   [phone] (ICD-10-CM) - Chronic bilateral low back pain with right-sided sciatica  Z98.1 (ICD-10-CM) - S/P lumbar fusion  R29.6 (ICD-10-CM) - Frequent fall   Rationale for Evaluation and Treatment: Rehabilitation  THERAPY DIAG:  Muscle weakness (generalized)  Repeated falls  Difficulty in walking, not elsewhere classified  Unsteadiness on feet  Localized edema  ONSET DATE: 11/24/22  SUBJECTIVE:  SUBJECTIVE STATEMENT: "I feel Good" "I got trigger shots"  PERTINENT HISTORY:  Per referring physician note: PMHx: hyperlipidemia, HTN, lumbar radiculopathy with fusion, 2020, R TKR 5/22  Chronic bilateral low back pain with sciatica       In light of the recent falls, I will refer her to PT for strengthening and balance training.      PAIN:  Are you having pain? Yes: NPRS scale: 3/10 Pain location: Low back Pain description: numbness and tingling into R toes Aggravating factors: nothing specific, steps, walking long er distances-grocery storer Relieving factors: Stopping whatever task she is performing,   PRECAUTIONS: Back and Fall  RED FLAGS: None   WEIGHT BEARING RESTRICTIONS: No  FALLS:  Has patient fallen in last 6 months? Yes. Number of falls 3 she feels like her R knee gave way.  LIVING ENVIRONMENT: Lives with: lives alone Lives in: House/apartment Stairs: Yes: Internal: 12 steps; on right going up Has following equipment at home: Single point cane and Walker - 2 wheeled  OCCUPATION: Out of work since 2022 due to R knee giving out. Was working at [employer], had to stand, walk, bend over, tec.  PLOF: Independent  PATIENT GOALS: Patient reports that she would like to prevent falls and injury, possibly get back to some sort of work  NEXT MD VISIT: 2 1/2  months  OBJECTIVE:  Note: Objective measures were completed at Evaluation unless otherwise noted.  DIAGNOSTIC FINDINGS:  Per lumbar MRI 03/02/21 IMPRESSION: 1. Status post posterior instrumented fusion at L4-L5 without evidence of complication. 2. Overall mild multilevel degenerative changes throughout the lumbar spine detailed above are not significantly changed since 2021, including mild bilateral subarticular zone and neural foraminal narrowing at L3-L4, mild left subarticular zone and mild left worse than right neural foraminal stenosis at L4-L5, and mild right subarticular zone narrowing at L5-S1, without evidence of nerve root impingement.  COGNITION: Overall cognitive status: Within functional limits for tasks assessed     SENSATION: Light touch: Impaired   MUSCLE LENGTH: Hamstrings: Right 40 deg; Left 45 deg Thomas test: B WFL  POSTURE: increased lumbar lordosis, anterior pelvic tilt, left pelvic obliquity, and weight shift left  PALPATION: Tight in lumbar paraspinals, B, TTP B piriformis and ITB, R gluts prox hamstring tendons  LUMBAR ROM:   AROM eval  Flexion Mid shin  Extension WNL  Right lateral flexion Mid thigh, P  Left lateral flexion Mid thigh, P  Right rotation 30%  Left rotation 60%   (Blank rows = not tested)  LOWER EXTREMITY ROM: BLE WFL, R ankle DF slightly limited.  LOWER EXTREMITY MMT:    MMT Right eval Left eval  Hip flexion 3- 4  Hip extension 3- 4  Hip abduction 3- 4  Hip adduction    Hip internal rotation    Hip external rotation    Knee flexion 3+ 4  Knee extension 4- 4  Ankle dorsiflexion 3+ 4  Ankle plantarflexion    Ankle inversion    Ankle eversion     (Blank rows = not tested)  LUMBAR SPECIAL TESTS:  Straight leg raise test: Negative and Single leg stance test: Positive, slump test neg  FUNCTIONAL TESTS:  5 times sit to stand: 25.41 Timed up and go (TUG): 19.09  GAIT: Distance walked: In clinic  distances Assistive device utilized: None Level of assistance: Modified independence Comments: Increased L lateral sway, decreased WB and stance on R, unsteady, slow and labored.  TODAY'S TREATMENT:  DATE:  01/06/23 NuStep L5 x 7 min 6in step ups  Checked goals  LAQ 5lb RLE 2x15 Seated march RLE 5lb 2x10, without weight x10  Lateral step ups 6in with the L 4in for the R RLE 6in box taps 01/04/23 NuStep L5 x 6 min LAQ 5lb RLE 2x10 Seated march RLE 5lb 2x10 SAQ RLE 5lb 2x10 Bridges 2x10 LE on Pball bridges, oblq, K2C RLE HS curls green 2x10  S2S holding yellow ball 2x10   12/30/22 Bike L3 x 6 minutes Seated knee flexion 35#, BLE, 2 x 10 reps, 20#, LLE, 15# RLE, 2 x 10 each Seated knee ext 10#, BLE, 2 x 10 reps, Then eccentric 5#, 2 x 10 each leg Alternating step taps on 4" step, no UE support, 10 each Standing hip abd against Red Tband resistance, 4 x 5 reps each leg, with BUE support on RW Seated flexion over physioball x 10 forward and to each side.  12/28/22 Bike L 3.5 x 6 min Resisted gait 30lb 4 way x 4 each S2S OHP yellow ball 2x10 Leg press RLE 20lb 2x10 Seated LAQ RLE x15  12/23/22 NuStep L4 x 6 minutes Leg press, 40# 2 x 10 with BLE Standing shoulder ext and rows, 15#, 2 x 10 Seated shoulder ER with G tband 2 x 10 Seated knee flex, 35#, BLE 2 x 10 reps, 20#, 2 x 10 with each leg. Seated knee ext, 5#, BLE 2 x 10 then eccentric ext x 10 on each leg.  12/21/22 Supine stretch with physioball- DKTC, LTR Supine strengthening- Quick side to side roll, bridge Seated abdominal isometric press into ball in lap. Seated forward flexion over physioball to stretch low back, x 5 forward and to each side. Seated knee flexion against yellow tband x 10 reps Seated knee ext against Yellow Tband x 10 reps each leg   PATIENT EDUCATION:  Education  details: POC Person educated: Patient Education method: Explanation Education comprehension: verbalized understanding  HOME EXERCISE PROGRAM:  [id number]  ASSESSMENT:  CLINICAL IMPRESSION: Pt enters with no new issues. Again treatment emphasized strengthening of RLE, and trunk stability. She has progressed meeting some LTG's. Pt R hip flexor is very weak. She has a hard time picking her foot up with step ups. Pt ambulated at times dragging her RLE, can correct some with cues. She tolerated isolated muscle strengthening as well as functional strengthening with no reports of pain today. RLE continues to fatigue quick   OBJECTIVE IMPAIRMENTS: Abnormal gait, decreased activity tolerance, decreased balance, decreased coordination, decreased endurance, difficulty walking, decreased ROM, decreased strength, impaired flexibility, improper body mechanics, postural dysfunction, and pain.   ACTIVITY LIMITATIONS: carrying, lifting, bending, sitting, standing, squatting, sleeping, stairs, transfers, and locomotion level  PARTICIPATION LIMITATIONS: meal prep, cleaning, laundry, driving, shopping, community activity, and occupation  PERSONAL FACTORS: Past/current experiences are also affecting patient's functional outcome.   REHAB POTENTIAL: Good  CLINICAL DECISION MAKING: Stable/uncomplicated  EVALUATION COMPLEXITY: Moderate   GOALS: Goals reviewed with patient? Yes  SHORT TERM GOALS: Target date: 12/25/22  I with initial HEP Baseline: Goal status: 12/28/22 Met  LONG TERM GOALS: Target date: 02/17/23  I with final HEP Baseline:  Goal status: INITIAL  2.  Patient will recover lumbar ROM to at least 80% in all planes of movement with back pain < 3/10 Baseline:  Goal status: 12/30/22-ROM slightly improved, up to 50%, but still having some pain. Ongoing  3.  Increase B LE strength to at least 4+/5. Baseline:  Goal status:  12/21/22- R knee 3+/5, ongoing  4.  Complete 5x STS in < 12 sec  with equal WB Baseline:  Goal status: Progressing 13.58 01/06/23  5.  Complete TUG in < 12 sec with no unsteadiness and normalized gait pattern Baseline:  Goal status: 11.23 Met 11.23   6.  Patient will ambulate at least 400' in 6 minute walk test, demonstrating normalized gait pattern, no unsteadiness Baseline:  Goal status: INITIAL  PLAN:  PT FREQUENCY: 2x/week  PT DURATION: 10 weeks  PLANNED INTERVENTIONS: 97110-Therapeutic exercises, 97530- Therapeutic activity, [id number]- Neuromuscular re-education, 97535- Self Care, 29528- Manual therapy, [id number]- Gait training, 97014- Electrical stimulation (unattended), [phone]- Ionotophoresis 4mg /ml Dexamethasone, Patient/Family education, Balance training, Stair training, Dry Needling, Joint mobilization, Spinal mobilization, Cryotherapy, and Moist heat.  PLAN FOR NEXT SESSION: Lower body strengthening. R knee gives at times, B hips and R knee very weak.   Debroah Baller, PTA 01/06/2023, 3:19 PM

## 2023-01-08 ENCOUNTER — Telehealth: Payer: Self-pay | Admitting: Physical Medicine and Rehabilitation

## 2023-01-11 ENCOUNTER — Ambulatory Visit: Payer: Medicaid Other | Admitting: Physical Therapy

## 2023-01-11 ENCOUNTER — Encounter: Payer: Self-pay | Admitting: Physical Therapy

## 2023-01-11 ENCOUNTER — Telehealth: Payer: Self-pay

## 2023-01-11 DIAGNOSIS — M6281 Muscle weakness (generalized): Secondary | ICD-10-CM

## 2023-01-11 DIAGNOSIS — R262 Difficulty in walking, not elsewhere classified: Secondary | ICD-10-CM

## 2023-01-11 DIAGNOSIS — R2681 Unsteadiness on feet: Secondary | ICD-10-CM

## 2023-01-11 DIAGNOSIS — R296 Repeated falls: Secondary | ICD-10-CM

## 2023-01-11 NOTE — Telephone Encounter (Signed)
Tramadol PA faxed 01/11/2023 to [facility] Medicaid

## 2023-01-11 NOTE — Therapy (Signed)
OUTPATIENT PHYSICAL THERAPY THORACOLUMBAR EVALUATION   Patient Name: Jessica Huff MRN: [id number] DOB:October 12, 1960, 62 y.o., female Today's Date: 01/11/2023  END OF SESSION:  PT End of Session - 01/11/23 1517     Visit Number 10    Date for PT Re-Evaluation 02/17/23    PT Start Time 1517    PT Stop Time 1600    PT Time Calculation (min) 43 min    Activity Tolerance Patient tolerated treatment well    Behavior During Therapy [facility] for tasks assessed/performed               Past Medical History:  Diagnosis Date   Asthma    GERD (gastroesophageal reflux disease)    Hyperlipidemia    Hypertension    Past Surgical History:  Procedure Laterality Date   ANKLE SURGERY Right 2018   LUMBAR FUSION  2020   L4-L5   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   Procedure: RIGHT TOTAL KNEE ARTHROPLASTY;  Surgeon: Nadara Mustard, MD;  Location: [facility] OR;  Service: Orthopedics;  Laterality: Right;   Patient Active Problem List   Diagnosis Date Noted   History of uterine fibroid 06/01/2022   Lumbar radiculitis 05/07/2022   Myofascial pain 04/08/2022   Total knee replacement status, right 06/25/2021   Acute pain of right knee 02/07/2021   Malaise and fatigue 02/07/2021   Prediabetes 10/19/2018   S/P lumbar fusion 09/16/2018   Asthma in adult, moderate persistent, uncomplicated 07/03/2016   Chronic pain of right ankle 03/02/2016   Allergic rhinitis 11/29/2015   Class 1 obesity due to excess calories with serious comorbidity and body mass index (BMI) of 32.0 to 32.9 in adult 11/29/2015   Benign positional vertigo 04/07/2015   Chronic bilateral low back pain with sciatica 04/07/2015   Common migraine 04/07/2015   Hyperlipidemia 04/07/2015   Essential hypertension 04/07/2015   Insomnia 04/07/2015   Obstructive sleep apnea 04/07/2015   Lumbar radiculopathy 04/07/2015   Anxiety 04/07/2015   PCP: Loyola Mast, MD  REFERRING PROVIDER: Loyola Mast, MD  REFERRING DIAG:   [phone] (ICD-10-CM) - Chronic bilateral low back pain with right-sided sciatica  Z98.1 (ICD-10-CM) - S/P lumbar fusion  R29.6 (ICD-10-CM) - Frequent fall   Rationale for Evaluation and Treatment: Rehabilitation  THERAPY DIAG:  Muscle weakness (generalized)  Difficulty in walking, not elsewhere classified  Unsteadiness on feet  Repeated falls  ONSET DATE: 11/24/22  SUBJECTIVE:  SUBJECTIVE STATEMENT: "I feel Good"   PERTINENT HISTORY:  Per referring physician note: PMHx: hyperlipidemia, HTN, lumbar radiculopathy with fusion, 2020, R TKR 5/22  Chronic bilateral low back pain with sciatica       In light of the recent falls, I will refer her to PT for strengthening and balance training.      PAIN:  Are you having pain? Yes: NPRS scale: 3/10 Pain location: Low back Pain description: numbness and tingling into R toes Aggravating factors: nothing specific, steps, walking long er distances-grocery storer Relieving factors: Stopping whatever task she is performing,   PRECAUTIONS: Back and Fall  RED FLAGS: None   WEIGHT BEARING RESTRICTIONS: No  FALLS:  Has patient fallen in last 6 months? Yes. Number of falls 3 she feels like her R knee gave way.  LIVING ENVIRONMENT: Lives with: lives alone Lives in: House/apartment Stairs: Yes: Internal: 12 steps; on right going up Has following equipment at home: Single point cane and Walker - 2 wheeled  OCCUPATION: Out of work since 2022 due to R knee giving out. Was working at [employer], had to stand, walk, bend over, tec.  PLOF: Independent  PATIENT GOALS: Patient reports that she would like to prevent falls and injury, possibly get back to some sort of work  NEXT MD VISIT: 2 1/2 months  OBJECTIVE:  Note: Objective measures were  completed at Evaluation unless otherwise noted.  DIAGNOSTIC FINDINGS:  Per lumbar MRI 03/02/21 IMPRESSION: 1. Status post posterior instrumented fusion at L4-L5 without evidence of complication. 2. Overall mild multilevel degenerative changes throughout the lumbar spine detailed above are not significantly changed since 2021, including mild bilateral subarticular zone and neural foraminal narrowing at L3-L4, mild left subarticular zone and mild left worse than right neural foraminal stenosis at L4-L5, and mild right subarticular zone narrowing at L5-S1, without evidence of nerve root impingement.  COGNITION: Overall cognitive status: Within functional limits for tasks assessed     SENSATION: Light touch: Impaired   MUSCLE LENGTH: Hamstrings: Right 40 deg; Left 45 deg Thomas test: B WFL  POSTURE: increased lumbar lordosis, anterior pelvic tilt, left pelvic obliquity, and weight shift left  PALPATION: Tight in lumbar paraspinals, B, TTP B piriformis and ITB, R gluts prox hamstring tendons  LUMBAR ROM:   AROM eval  Flexion Mid shin  Extension WNL  Right lateral flexion Mid thigh, P  Left lateral flexion Mid thigh, P  Right rotation 30%  Left rotation 60%   (Blank rows = not tested)  LOWER EXTREMITY ROM: BLE WFL, R ankle DF slightly limited.  LOWER EXTREMITY MMT:    MMT Right eval Left eval  Hip flexion 3- 4  Hip extension 3- 4  Hip abduction 3- 4  Hip adduction    Hip internal rotation    Hip external rotation    Knee flexion 3+ 4  Knee extension 4- 4  Ankle dorsiflexion 3+ 4  Ankle plantarflexion    Ankle inversion    Ankle eversion     (Blank rows = not tested)  LUMBAR SPECIAL TESTS:  Straight leg raise test: Negative and Single leg stance test: Positive, slump test neg  FUNCTIONAL TESTS:  5 times sit to stand: 25.41 Timed up and go (TUG): 19.09  GAIT: Distance walked: In clinic distances Assistive device utilized: None Level of assistance:  Modified independence Comments: Increased L lateral sway, decreased WB and stance on R, unsteady, slow and labored.  TODAY'S TREATMENT:  DATE:  01/11/23 NuStep L5 x 7 min Gait to bacl building, one flight of stairs then back down out back door up hill, one seated rest in the middle island LAQ 3lb RLE 2x15 Seated march RLE 3lb 2x15 Hs curls green RLE 2x10  01/06/23 NuStep L5 x 7 min 6in step ups  Checked goals  LAQ 5lb RLE 2x15 Seated march RLE 5lb 2x10, without weight x10  Lateral step ups 6in with the L 4in for the R RLE 6in box taps 01/04/23 NuStep L5 x 6 min LAQ 5lb RLE 2x10 Seated march RLE 5lb 2x10 SAQ RLE 5lb 2x10 Bridges 2x10 LE on Pball bridges, oblq, K2C RLE HS curls green 2x10  S2S holding yellow ball 2x10   12/30/22 Bike L3 x 6 minutes Seated knee flexion 35#, BLE, 2 x 10 reps, 20#, LLE, 15# RLE, 2 x 10 each Seated knee ext 10#, BLE, 2 x 10 reps, Then eccentric 5#, 2 x 10 each leg Alternating step taps on 4" step, no UE support, 10 each Standing hip abd against Red Tband resistance, 4 x 5 reps each leg, with BUE support on RW Seated flexion over physioball x 10 forward and to each side.  12/28/22 Bike L 3.5 x 6 min Resisted gait 30lb 4 way x 4 each S2S OHP yellow ball 2x10 Leg press RLE 20lb 2x10 Seated LAQ RLE x15  12/23/22 NuStep L4 x 6 minutes Leg press, 40# 2 x 10 with BLE Standing shoulder ext and rows, 15#, 2 x 10 Seated shoulder ER with G tband 2 x 10 Seated knee flex, 35#, BLE 2 x 10 reps, 20#, 2 x 10 with each leg. Seated knee ext, 5#, BLE 2 x 10 then eccentric ext x 10 on each leg.  12/21/22 Supine stretch with physioball- DKTC, LTR Supine strengthening- Quick side to side roll, bridge Seated abdominal isometric press into ball in lap. Seated forward flexion over physioball to stretch low back, x 5 forward and to each  side. Seated knee flexion against yellow tband x 10 reps Seated knee ext against Yellow Tband x 10 reps each leg   PATIENT EDUCATION:  Education details: POC Person educated: Patient Education method: Explanation Education comprehension: verbalized understanding  HOME EXERCISE PROGRAM:  [id number]  ASSESSMENT:  CLINICAL IMPRESSION: Pt enters with no new issues. Again treatment emphasized strengthening of RLE wit functional interventions. Pt R hip flexor is very weak often dragging her r foot with ambulation especially with uphill ambulation. Pt had a difficult tome ascending stairs due to difficulty lifting her RLE onto step. She tolerated isolated muscle strengthening as well as functional strengthening with no reports of pain today. RLE continues to fatigue quick   OBJECTIVE IMPAIRMENTS: Abnormal gait, decreased activity tolerance, decreased balance, decreased coordination, decreased endurance, difficulty walking, decreased ROM, decreased strength, impaired flexibility, improper body mechanics, postural dysfunction, and pain.   ACTIVITY LIMITATIONS: carrying, lifting, bending, sitting, standing, squatting, sleeping, stairs, transfers, and locomotion level  PARTICIPATION LIMITATIONS: meal prep, cleaning, laundry, driving, shopping, community activity, and occupation  PERSONAL FACTORS: Past/current experiences are also affecting patient's functional outcome.   REHAB POTENTIAL: Good  CLINICAL DECISION MAKING: Stable/uncomplicated  EVALUATION COMPLEXITY: Moderate   GOALS: Goals reviewed with patient? Yes  SHORT TERM GOALS: Target date: 12/25/22  I with initial HEP Baseline: Goal status: 12/28/22 Met  LONG TERM GOALS: Target date: 02/17/23  I with final HEP Baseline:  Goal status: INITIAL  2.  Patient will recover lumbar ROM to at least 80% in  all planes of movement with back pain < 3/10 Baseline:  Goal status: 12/30/22-ROM slightly improved, up to 50%, but still having  some pain. Ongoing  3.  Increase B LE strength to at least 4+/5. Baseline:  Goal status: 12/21/22- R knee 3+/5, ongoing  4.  Complete 5x STS in < 12 sec with equal WB Baseline:  Goal status: Progressing 13.58 01/06/23  5.  Complete TUG in < 12 sec with no unsteadiness and normalized gait pattern Baseline:  Goal status: 11.23 Met 11.23   6.  Patient will ambulate at least 400' in 6 minute walk test, demonstrating normalized gait pattern, no unsteadiness Baseline:  Goal status: INITIAL  PLAN:  PT FREQUENCY: 2x/week  PT DURATION: 10 weeks  PLANNED INTERVENTIONS: 97110-Therapeutic exercises, 97530- Therapeutic activity, 97112- Neuromuscular re-education, 97535- Self Care, 65784- Manual therapy, [id number]- Gait training, 97014- Electrical stimulation (unattended), [phone]- Ionotophoresis 4mg /ml Dexamethasone, Patient/Family education, Balance training, Stair training, Dry Needling, Joint mobilization, Spinal mobilization, Cryotherapy, and Moist heat.  PLAN FOR NEXT SESSION: Lower body strengthening. R knee gives at times, B hips and R knee very weak.   Debroah Baller, PTA 01/11/2023, 3:18 PM

## 2023-01-13 ENCOUNTER — Ambulatory Visit: Payer: Medicaid Other | Admitting: Physical Therapy

## 2023-01-15 NOTE — Telephone Encounter (Signed)
Called pharmacy and prescription went through for the full quantity of 60. Called patient to advise. Expires May 2025.

## 2023-01-15 NOTE — Telephone Encounter (Signed)
Jessica Huff (Key: [id number])  PA Submitted through cover my meds

## 2023-01-15 NOTE — Telephone Encounter (Signed)
PA approved for tramadol   PA Case ID #: [id number] Need Help? Call us at [phone] Outcome Approved today by [facility] NCP[employer] Request Reference Number: [id number]. TRAMADOL HCL TAB 50MG  is approved through 07/15/2023. For further questions, call [employer] at [phone]. Authorization Expiration Date: 07/15/2023

## 2023-01-15 NOTE — Telephone Encounter (Signed)
Can we get the prior auth for her- I thought we were working on it for her- but want to verify- for her tramadol- thanks- ML

## 2023-01-18 ENCOUNTER — Ambulatory Visit: Payer: Medicaid Other | Admitting: Physical Therapy

## 2023-01-20 ENCOUNTER — Encounter: Payer: Self-pay | Admitting: Physical Therapy

## 2023-01-20 ENCOUNTER — Ambulatory Visit: Payer: Medicaid Other | Admitting: Physical Therapy

## 2023-01-20 DIAGNOSIS — R262 Difficulty in walking, not elsewhere classified: Secondary | ICD-10-CM

## 2023-01-20 DIAGNOSIS — M6281 Muscle weakness (generalized): Secondary | ICD-10-CM

## 2023-01-20 DIAGNOSIS — R296 Repeated falls: Secondary | ICD-10-CM

## 2023-01-20 DIAGNOSIS — R2681 Unsteadiness on feet: Secondary | ICD-10-CM

## 2023-01-20 NOTE — Therapy (Signed)
OUTPATIENT PHYSICAL THERAPY THORACOLUMBAR EVALUATION   Patient Name: Jessica Huff MRN: [id number] DOB:Jul 12, 1960, 62 y.o., female Today's Date: 01/20/2023  END OF SESSION:  PT End of Session - 01/20/23 1600     Visit Number 11    Date for PT Re-Evaluation 02/17/23    PT Start Time 1600    PT Stop Time 1645    PT Time Calculation (min) 45 min    Activity Tolerance Patient tolerated treatment well    Behavior During Therapy WFL for tasks assessed/performed               Past Medical History:  Diagnosis Date   Asthma    GERD (gastroesophageal reflux disease)    Hyperlipidemia    Hypertension    Past Surgical History:  Procedure Laterality Date   ANKLE SURGERY Right 2018   LUMBAR FUSION  2020   L4-L5   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   Procedure: RIGHT TOTAL KNEE ARTHROPLASTY;  Surgeon: Nadara Mustard, MD;  Location: [facility] OR;  Service: Orthopedics;  Laterality: Right;   Patient Active Problem List   Diagnosis Date Noted   History of uterine fibroid 06/01/2022   Lumbar radiculitis 05/07/2022   Myofascial pain 04/08/2022   Total knee replacement status, right 06/25/2021   Acute pain of right knee 02/07/2021   Malaise and fatigue 02/07/2021   Prediabetes 10/19/2018   S/P lumbar fusion 09/16/2018   Asthma in adult, moderate persistent, uncomplicated 07/03/2016   Chronic pain of right ankle 03/02/2016   Allergic rhinitis 11/29/2015   Class 1 obesity due to excess calories with serious comorbidity and body mass index (BMI) of 32.0 to 32.9 in adult 11/29/2015   Benign positional vertigo 04/07/2015   Chronic bilateral low back pain with sciatica 04/07/2015   Common migraine 04/07/2015   Hyperlipidemia 04/07/2015   Essential hypertension 04/07/2015   Insomnia 04/07/2015   Obstructive sleep apnea 04/07/2015   Lumbar radiculopathy 04/07/2015   Anxiety 04/07/2015   PCP: Loyola Mast, MD  REFERRING PROVIDER: Loyola Mast, MD  REFERRING DIAG:   [phone] (ICD-10-CM) - Chronic bilateral low back pain with right-sided sciatica  Z98.1 (ICD-10-CM) - S/P lumbar fusion  R29.6 (ICD-10-CM) - Frequent fall   Rationale for Evaluation and Treatment: Rehabilitation  THERAPY DIAG:  Muscle weakness (generalized)  Difficulty in walking, not elsewhere classified  Unsteadiness on feet  Repeated falls  ONSET DATE: 11/24/22  SUBJECTIVE:  SUBJECTIVE STATEMENT: "I feel Good"   PERTINENT HISTORY:  Per referring physician note: PMHx: hyperlipidemia, HTN, lumbar radiculopathy with fusion, 2020, R TKR 5/22  Chronic bilateral low back pain with sciatica       In light of the recent falls, I will refer her to PT for strengthening and balance training.      PAIN:  Are you having pain? Yes: NPRS scale: 3/10 Pain location: Low back Pain description: numbness and tingling into R toes Aggravating factors: nothing specific, steps, walking long er distances-grocery storer Relieving factors: Stopping whatever task she is performing,   PRECAUTIONS: Back and Fall  RED FLAGS: None   WEIGHT BEARING RESTRICTIONS: No  FALLS:  Has patient fallen in last 6 months? Yes. Number of falls 3 she feels like her R knee gave way.  LIVING ENVIRONMENT: Lives with: lives alone Lives in: House/apartment Stairs: Yes: Internal: 12 steps; on right going up Has following equipment at home: Single point cane and Walker - 2 wheeled  OCCUPATION: Out of work since 2022 due to R knee giving out. Was working at [employer], had to stand, walk, bend over, tec.  PLOF: Independent  PATIENT GOALS: Patient reports that she would like to prevent falls and injury, possibly get back to some sort of work  NEXT MD VISIT: 2 1/2 months  OBJECTIVE:  Note: Objective measures were  completed at Evaluation unless otherwise noted.  DIAGNOSTIC FINDINGS:  Per lumbar MRI 03/02/21 IMPRESSION: 1. Status post posterior instrumented fusion at L4-L5 without evidence of complication. 2. Overall mild multilevel degenerative changes throughout the lumbar spine detailed above are not significantly changed since 2021, including mild bilateral subarticular zone and neural foraminal narrowing at L3-L4, mild left subarticular zone and mild left worse than right neural foraminal stenosis at L4-L5, and mild right subarticular zone narrowing at L5-S1, without evidence of nerve root impingement.  COGNITION: Overall cognitive status: Within functional limits for tasks assessed     SENSATION: Light touch: Impaired   MUSCLE LENGTH: Hamstrings: Right 40 deg; Left 45 deg Thomas test: B WFL  POSTURE: increased lumbar lordosis, anterior pelvic tilt, left pelvic obliquity, and weight shift left  PALPATION: Tight in lumbar paraspinals, B, TTP B piriformis and ITB, R gluts prox hamstring tendons  LUMBAR ROM:   AROM eval 01/20/23  Flexion Mid shin WFL  Extension WNL WNL  Right lateral flexion Mid thigh, P Limited 50%  Left lateral flexion Mid thigh, P Limited 50%  Right rotation 30% Limited 50%  Left rotation 60% Limited 50%   (Blank rows = not tested)  LOWER EXTREMITY ROM: BLE WFL, R ankle DF slightly limited.  LOWER EXTREMITY MMT:    MMT Right eval Left eval  Hip flexion 3- 4  Hip extension 3- 4  Hip abduction 3- 4  Hip adduction    Hip internal rotation    Hip external rotation    Knee flexion 3+ 4  Knee extension 4- 4  Ankle dorsiflexion 3+ 4  Ankle plantarflexion    Ankle inversion    Ankle eversion     (Blank rows = not tested)  LUMBAR SPECIAL TESTS:  Straight leg raise test: Negative and Single leg stance test: Positive, slump test neg  FUNCTIONAL TESTS:  5 times sit to stand: 25.41 Timed up and go (TUG): 19.09  GAIT: Distance walked: In clinic  distances Assistive device utilized: None Level of assistance: Modified independence Comments: Increased L lateral sway, decreased WB and stance on R, unsteady, slow and  labored.  TODAY'S TREATMENT:                                                                                                                              DATE:  01/20/23 NuStep L 5 x 6 min 6in forward step ups x10 each  Hip Ext and abd 5lb 2x10 R hip flex 5lb 2x10 RLE HS curls 15lb 2x10 RLE leg Ext 5lb 2x10 Standing hip and knee flex 2x10 RLE 1lb S2S slightly elevated mat x10, x5  Leg press LLE 20lb 2x10  01/11/23 NuStep L5 x 7 min Gait to bacl building, one flight of stairs then back down out back door up hill, one seated rest in the middle island LAQ 3lb RLE 2x15 Seated march RLE 3lb 2x15 Hs curls green RLE 2x10    01/06/23 NuStep L5 x 7 min 6in step ups  Checked goals  LAQ 5lb RLE 2x15 Seated march RLE 5lb 2x10, without weight x10  Lateral step ups 6in with the L 4in for the R RLE 6in box taps 01/04/23 NuStep L5 x 6 min LAQ 5lb RLE 2x10 Seated march RLE 5lb 2x10 SAQ RLE 5lb 2x10 Bridges 2x10 LE on Pball bridges, oblq, K2C RLE HS curls green 2x10  S2S holding yellow ball 2x10  12/30/22 Bike L3 x 6 minutes Seated knee flexion 35#, BLE, 2 x 10 reps, 20#, LLE, 15# RLE, 2 x 10 each Seated knee ext 10#, BLE, 2 x 10 reps, Then eccentric 5#, 2 x 10 each leg Alternating step taps on 4" step, no UE support, 10 each Standing hip abd against Red Tband resistance, 4 x 5 reps each leg, with BUE support on RW Seated flexion over physioball x 10 forward and to each side.  12/28/22 Bike L 3.5 x 6 min Resisted gait 30lb 4 way x 4 each S2S OHP yellow ball 2x10 Leg press RLE 20lb 2x10 Seated LAQ RLE x15  12/23/22 NuStep L4 x 6 minutes Leg press, 40# 2 x 10 with BLE Standing shoulder ext and rows, 15#, 2 x 10 Seated shoulder ER with G tband 2 x 10 Seated knee flex, 35#, BLE 2 x 10 reps, 20#, 2 x 10 with  each leg. Seated knee ext, 5#, BLE 2 x 10 then eccentric ext x 10 on each leg.   PATIENT EDUCATION:  Education details: POC Person educated: Patient Education method: Explanation Education comprehension: verbalized understanding  HOME EXERCISE PROGRAM:  [id number]  ASSESSMENT:  CLINICAL IMPRESSION: Pt enters with no new issues. Again treatment emphasized strengthening of RLE wit functional interventions. Pt R hip flexor is very weak often dragging her R foot with ambulation. Cue given during session not to help RLE with UE getting on and off the machines.  She tolerated isolated muscle strengthening as well as functional strengthening with no reports of pain today. Compensation present with standing hip and knee flexion. Pt unable to complete sit to stands with equal weight distribution. RLE  continues to fatigue quick   OBJECTIVE IMPAIRMENTS: Abnormal gait, decreased activity tolerance, decreased balance, decreased coordination, decreased endurance, difficulty walking, decreased ROM, decreased strength, impaired flexibility, improper body mechanics, postural dysfunction, and pain.   ACTIVITY LIMITATIONS: carrying, lifting, bending, sitting, standing, squatting, sleeping, stairs, transfers, and locomotion level  PARTICIPATION LIMITATIONS: meal prep, cleaning, laundry, driving, shopping, community activity, and occupation  PERSONAL FACTORS: Past/current experiences are also affecting patient's functional outcome.   REHAB POTENTIAL: Good  CLINICAL DECISION MAKING: Stable/uncomplicated  EVALUATION COMPLEXITY: Moderate   GOALS: Goals reviewed with patient? Yes  SHORT TERM GOALS: Target date: 12/25/22  I with initial HEP Baseline: Goal status: 12/28/22 Met  LONG TERM GOALS: Target date: 02/17/23  I with final HEP Baseline:  Goal status: INITIAL  2.  Patient will recover lumbar ROM to at least 80% in all planes of movement with back pain < 3/10 Baseline:  Goal status:  12/30/22-ROM slightly improved, up to 50%, but still having some pain. Ongoing  3.  Increase B LE strength to at least 4+/5. Baseline:  Goal status: 12/21/22- R knee 3+/5, ongoing  4.  Complete 5x STS in < 12 sec with equal WB Baseline:  Goal status: Progressing 13.58 01/06/23  5.  Complete TUG in < 12 sec with no unsteadiness and normalized gait pattern Baseline:  Goal status: 11.23 Met 11.23   6.  Patient will ambulate at least 400' in 6 minute walk test, demonstrating normalized gait pattern, no unsteadiness Baseline:  Goal status: INITIAL  PLAN:  PT FREQUENCY: 2x/week  PT DURATION: 10 weeks  PLANNED INTERVENTIONS: 97110-Therapeutic exercises, 97530- Therapeutic activity, [id number]- Neuromuscular re-education, 97535- Self Care, 52841- Manual therapy, [id number]- Gait training, 97014- Electrical stimulation (unattended), [phone]- Ionotophoresis 4mg /ml Dexamethasone, Patient/Family education, Balance training, Stair training, Dry Needling, Joint mobilization, Spinal mobilization, Cryotherapy, and Moist heat.  PLAN FOR NEXT SESSION: Lower body strengthening. R knee gives at times, B hips and R knee very weak.   Debroah Baller, PTA 01/20/2023, 4:01 PM

## 2023-02-01 ENCOUNTER — Encounter: Payer: Self-pay | Admitting: Family Medicine

## 2023-02-01 DIAGNOSIS — E785 Hyperlipidemia, unspecified: Secondary | ICD-10-CM

## 2023-02-03 MED ORDER — ROSUVASTATIN CALCIUM 40 MG PO TABS
40.0000 mg | ORAL_TABLET | Freq: Every day | ORAL | 1 refills | Status: DC
Start: 2023-02-03 — End: 2023-08-05

## 2023-02-25 ENCOUNTER — Ambulatory Visit (INDEPENDENT_AMBULATORY_CARE_PROVIDER_SITE_OTHER): Payer: Medicaid Other | Admitting: Family Medicine

## 2023-02-25 ENCOUNTER — Other Ambulatory Visit: Payer: Self-pay | Admitting: Family Medicine

## 2023-02-25 VITALS — BP 130/76 | HR 82 | Temp 97.3°F | Ht 72.0 in | Wt 234.2 lb

## 2023-02-25 DIAGNOSIS — Z1231 Encounter for screening mammogram for malignant neoplasm of breast: Secondary | ICD-10-CM

## 2023-02-25 DIAGNOSIS — E782 Mixed hyperlipidemia: Secondary | ICD-10-CM | POA: Diagnosis not present

## 2023-02-25 DIAGNOSIS — I10 Essential (primary) hypertension: Secondary | ICD-10-CM | POA: Diagnosis not present

## 2023-02-25 DIAGNOSIS — R7303 Prediabetes: Secondary | ICD-10-CM | POA: Diagnosis not present

## 2023-02-25 DIAGNOSIS — Z1159 Encounter for screening for other viral diseases: Secondary | ICD-10-CM | POA: Diagnosis not present

## 2023-02-25 DIAGNOSIS — E876 Hypokalemia: Secondary | ICD-10-CM

## 2023-02-25 DIAGNOSIS — J301 Allergic rhinitis due to pollen: Secondary | ICD-10-CM

## 2023-02-25 DIAGNOSIS — J4540 Moderate persistent asthma, uncomplicated: Secondary | ICD-10-CM | POA: Diagnosis not present

## 2023-02-25 LAB — BASIC METABOLIC PANEL
BUN: 9 mg/dL (ref 6–23)
CO2: 31 meq/L (ref 19–32)
Calcium: 9.2 mg/dL (ref 8.4–10.5)
Chloride: 100 meq/L (ref 96–112)
Creatinine, Ser: 0.74 mg/dL (ref 0.40–1.20)
GFR: 86.46 mL/min (ref >60.00)
Glucose, Bld: 131 mg/dL — ABNORMAL HIGH (ref 70–99)
Potassium: 3.1 meq/L — ABNORMAL LOW (ref 3.5–5.1)
Sodium: 140 meq/L (ref 135–145)

## 2023-02-25 LAB — LIPID PANEL
Cholesterol: 121 mg/dL (ref 0–200)
HDL: 33.2 mg/dL — ABNORMAL LOW (ref >39.00)
LDL Cholesterol: 51 mg/dL (ref 0–99)
NonHDL: 87.79
Total CHOL/HDL Ratio: 4
Triglycerides: 183 mg/dL — ABNORMAL HIGH (ref 0.0–149.0)
VLDL: 36.6 mg/dL (ref 0.0–40.0)

## 2023-02-25 LAB — HEMOGLOBIN A1C: Hgb A1c MFr Bld: 6.4 % (ref 4.6–6.5)

## 2023-02-25 MED ORDER — POTASSIUM CHLORIDE CRYS ER 10 MEQ PO TBCR
10.0000 meq | EXTENDED_RELEASE_TABLET | Freq: Two times a day (BID) | ORAL | 11 refills | Status: AC
Start: 2023-02-25 — End: ?

## 2023-02-25 MED ORDER — MONTELUKAST SODIUM 10 MG PO TABS: 10.0000 mg | ORAL_TABLET | Freq: Every day | ORAL | 1 refills | Status: DC

## 2023-02-25 MED ORDER — FLUTICASONE PROPIONATE 50 MCG/ACT NA SUSP: 2.0000 | Freq: Every day | NASAL | 6 refills | Status: DC

## 2023-02-25 NOTE — Assessment & Plan Note (Signed)
 I will check annual blood sugar and A1c.

## 2023-02-25 NOTE — Progress Notes (Signed)
 [facility] [address] [place] [address] Dept: [phone] Dept Fax: [phone]  Chronic Care Office Visit  Subjective:    Patient ID: Jessica Huff, female    DOB: 1960/06/20, 63 y.o..   MRN: [id number]  Chief Complaint  Patient presents with   Hypertension    3 month f/u. BP at home 117/70 - 130/79.  No concerns.    History of Present Illness:  Patient is in today for reassessment of chronic medical issues.  Jessica Huff has a history of hypertension. She is currently on losartan -HCTZ (Hyzaar) 100-25 mg daily.   Jessica Huff has a history of hyperlipidemia. She is managed on rosuvastatin  40 mg daily and ezetimibe  10 mg daily.   Jessica Huff has a history of moderate persistent asthma. She is on fluticasone  furoate inhaler daily and albuterol  inhaler as needed.  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   Malaise and fatigue 02/07/2021   Prediabetes 10/19/2018   S/P lumbar fusion 09/16/2018   Asthma in adult, moderate persistent, uncomplicated 07/03/2016   Chronic pain of right ankle 03/02/2016   Allergic rhinitis 11/29/2015   Class 1 obesity due to excess calories with serious comorbidity and body mass index (BMI) of 32.0 to 32.9 in adult 11/29/2015   Benign positional vertigo 04/07/2015   Chronic bilateral low back pain with sciatica 04/07/2015   Common migraine 04/07/2015   Hyperlipidemia 04/07/2015   Essential hypertension 04/07/2015   Insomnia 04/07/2015   Obstructive sleep apnea 04/07/2015   Lumbar radiculopathy 04/07/2015   Anxiety 04/07/2015   Past Surgical History:  Procedure Laterality Date   ANKLE SURGERY Right 2018   LUMBAR FUSION  2020   L4-L5   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   Procedure: RIGHT TOTAL  KNEE ARTHROPLASTY;  Surgeon: Harden Jerona GAILS, MD;  Location: [facility] OR;  Service: Orthopedics;  Laterality: Right;   Family History  Problem Relation Age of Onset   Diabetes Mother    Kidney disease Mother    Cancer Father        Kidney   Outpatient Medications Prior to Visit  Medication Sig Dispense Refill   albuterol  (VENTOLIN  HFA) 108 (90 Base) MCG/ACT inhaler INHALE 2 PUFFS INTO THE LUNGS EVERY 6 HOURS AS NEEDED 6.7 g 6   ALPRAZolam  (XANAX ) 0.5 MG tablet Take 1 tablet (0.5 mg total) by mouth daily as needed for flying 2 tablet 0   ezetimibe  (ZETIA ) 10 MG tablet TAKE 1 TABLET(10 MG) BY MOUTH DAILY 90 tablet 3   Fluticasone  Furoate 200 MCG/ACT AEPB Inhale 1 Inhalation into the lungs daily. 30 each 11   levocetirizine (XYZAL ) 5 MG tablet Take 5 mg by mouth daily as needed for allergies.     losartan -hydrochlorothiazide  (HYZAAR) 100-25 MG tablet TAKE 1 TABLET BY MOUTH DAILY 90 tablet 3   Multiple Vitamin (MULTI-VITAMIN) tablet Take 1 tablet by mouth daily.     rosuvastatin  (CRESTOR ) 40 MG tablet Take 1 tablet (40 mg total) by mouth daily. 90 tablet 1   traMADol  (ULTRAM ) 50 MG tablet Take 1 tablet (50 mg total) by mouth 2 (two) times daily as needed. Fill for 30 days at a time- not 1 week, please- if problem, and need Prior auth- please let us  know- 60 tablet 5   fluticasone  (FLONASE ) 50 MCG/ACT nasal  spray SHAKE LIQUID AND USE 2 SPRAYS IN EACH NOSTRIL DAILY 16 g 6   montelukast  (SINGULAIR ) 10 MG tablet Take 1 tablet (10 mg total) by mouth at bedtime. 30 tablet 1   No facility-administered medications prior to visit.   No Known Allergies Objective:   Today's Vitals   02/25/23 1043  BP: 130/76  Pulse: 82  Temp: (!) 97.3 F (36.3 C)  TempSrc: Temporal  SpO2: 94%  Weight: 234 lb 3.2 oz (106.2 kg)  Height: 6' (1.829 m)   Body mass index is 31.76 kg/m.   General: Well developed, well nourished. No acute distress. Lungs: Clear to auscultation bilaterally. No wheezing, rales or  rhonchi. Psych: Alert and oriented. Normal mood and affect.  Health Maintenance Due  Topic Date Due   Hepatitis C Screening  Never done   MAMMOGRAM  12/27/2022     Assessment & Plan:   Problem List Items Addressed This Visit       Cardiovascular and Mediastinum   Essential hypertension - Primary   Blood pressure is in good control. Continue losartan -HCTZ (Hyzaar) 100-25 mg daily. I will check annual renal labs today.         Respiratory   Allergic rhinitis   Stable. I will renew her fluticasone  nasal spray and montelukast  10 mg daily.      Relevant Medications   fluticasone  (FLONASE ) 50 MCG/ACT nasal spray   montelukast  (SINGULAIR ) 10 MG tablet   Asthma in adult, moderate persistent, uncomplicated   Stable. Continue fluticasone  furoate AEPB daily and albuterol  as needed.      Relevant Medications   montelukast  (SINGULAIR ) 10 MG tablet     Other   Hyperlipidemia   Reassess lipids today. Continue rosuvastatin  40 mg daily and ezetimibe  10 mg daily.      Relevant Orders   Lipid panel   Prediabetes   I will check annual blood sugar and A1c.      Relevant Orders   Hemoglobin A1c   Basic metabolic panel   Other Visit Diagnoses       Encounter for hepatitis C screening test for low risk patient       Relevant Orders   HCV Ab w Reflex to Quant PCR     Encounter for screening mammogram for malignant neoplasm of breast       Relevant Orders   MM DIGITAL SCREENING BILATERAL       Return in about 3 months (around 05/26/2023) for Reassessment.   Garnette CHRISTELLA Simpler, MD

## 2023-02-25 NOTE — Assessment & Plan Note (Signed)
 Reassess lipids today. Continue rosuvastatin 40 mg daily and ezetimibe 10 mg daily.

## 2023-02-25 NOTE — Assessment & Plan Note (Signed)
 Stable. I will renew her fluticasone nasal spray and montelukast 10 mg daily.

## 2023-02-25 NOTE — Assessment & Plan Note (Signed)
 Blood pressure is in good control. Continue losartan-HCTZ (Hyzaar) 100-25 mg daily. I will check annual renal labs today.

## 2023-02-25 NOTE — Assessment & Plan Note (Signed)
 Stable. Continue fluticasone furoate AEPB daily and albuterol as needed.

## 2023-02-25 NOTE — Addendum Note (Signed)
 Addended by: Loyola Mast on: 02/25/2023 03:09 PM   Modules accepted: Orders

## 2023-02-26 LAB — HCV INTERPRETATION

## 2023-02-26 LAB — HCV AB W REFLEX TO QUANT PCR: HCV Ab: NONREACTIVE

## 2023-03-12 ENCOUNTER — Other Ambulatory Visit: Payer: Self-pay | Admitting: Family Medicine

## 2023-03-12 ENCOUNTER — Ambulatory Visit
Admission: RE | Admit: 2023-03-12 | Discharge: 2023-03-12 | Disposition: A | Discharge status: Home / Self Care | Payer: Medicaid Other | Source: Ambulatory Visit | Attending: Family Medicine | Admitting: Family Medicine

## 2023-03-12 DIAGNOSIS — Z1231 Encounter for screening mammogram for malignant neoplasm of breast: Secondary | ICD-10-CM | POA: Diagnosis not present

## 2023-03-12 DIAGNOSIS — E876 Hypokalemia: Secondary | ICD-10-CM

## 2023-03-12 DIAGNOSIS — J4540 Moderate persistent asthma, uncomplicated: Secondary | ICD-10-CM

## 2023-03-12 DIAGNOSIS — E782 Mixed hyperlipidemia: Secondary | ICD-10-CM

## 2023-03-12 DIAGNOSIS — I10 Essential (primary) hypertension: Secondary | ICD-10-CM

## 2023-03-12 DIAGNOSIS — R7303 Prediabetes: Secondary | ICD-10-CM

## 2023-03-12 DIAGNOSIS — Z1159 Encounter for screening for other viral diseases: Secondary | ICD-10-CM

## 2023-03-12 DIAGNOSIS — J301 Allergic rhinitis due to pollen: Secondary | ICD-10-CM

## 2023-04-02 DIAGNOSIS — Z0279 Encounter for issue of other medical certificate: Secondary | ICD-10-CM

## 2023-04-07 ENCOUNTER — Encounter: Payer: Self-pay | Admitting: Physical Medicine and Rehabilitation

## 2023-04-23 ENCOUNTER — Telehealth: Payer: Self-pay | Admitting: Physical Medicine and Rehabilitation

## 2023-04-23 NOTE — Telephone Encounter (Signed)
 Katelyn RN from [employer] disability called in and is requesting a call back , received restrictions form for patient and want to clarify if patient can not have a physical job , if patient can sit continually for 8 hrs and occasionally lift 10lbs. Ph provided is [phone] ex [phone] and has a secure voicemail

## 2023-04-27 ENCOUNTER — Ambulatory Visit (INDEPENDENT_AMBULATORY_CARE_PROVIDER_SITE_OTHER): Admitting: Family Medicine

## 2023-04-27 ENCOUNTER — Encounter: Payer: Self-pay | Admitting: Family Medicine

## 2023-04-27 VITALS — BP 142/82 | HR 81 | Temp 97.7°F | Ht 72.0 in | Wt 229.4 lb

## 2023-04-27 DIAGNOSIS — J069 Acute upper respiratory infection, unspecified: Secondary | ICD-10-CM | POA: Diagnosis not present

## 2023-04-27 DIAGNOSIS — H8302 Labyrinthitis, left ear: Secondary | ICD-10-CM

## 2023-04-27 DIAGNOSIS — I10 Essential (primary) hypertension: Secondary | ICD-10-CM | POA: Diagnosis not present

## 2023-04-27 MED ORDER — MECLIZINE HCL 25 MG PO TABS
25.0000 mg | ORAL_TABLET | Freq: Three times a day (TID) | ORAL | 0 refills | Status: AC | PRN
Start: 2023-04-27 — End: ?

## 2023-04-27 MED ORDER — PROMETHAZINE-DM 6.25-15 MG/5ML PO SYRP
5.0000 mL | ORAL_SOLUTION | Freq: Four times a day (QID) | ORAL | 0 refills | Status: DC | PRN
Start: 2023-04-27 — End: 2023-05-26

## 2023-04-27 NOTE — Progress Notes (Signed)
 Established Patient Office Visit   Subjective:  Patient ID: Jessica Huff, female    DOB: [birth year]  Age: 63 y.o. MRN: [id number]  Chief Complaint  Patient presents with   Dizziness    Pt states her vertigo has increased in the past week.    Cough    Cough and congestion x 1 day.     Dizziness Associated symptoms include coughing. Pertinent negatives include no abdominal pain, chills, fever, headaches, myalgias, rash or weakness.  Cough Pertinent negatives include no chills, eye redness, fever, headaches, myalgias, rash, shortness of breath or wheezing.   Encounter Diagnoses  Name Primary?   Labyrinthitis of left ear Yes   Viral upper respiratory tract infection    Essential hypertension    Presents with a 1 week history of feeling a spinning sensation when she changes head position or rolls over in the bed.  She has experienced the symptoms over the last years from time to time but not until recently.  There has been some nausea.  1 day history of nasal congestion postnasal drip and cough.  There have been no fever or chills, body aches, difficulty breathing wheezing.  She took Alka-Seltzer plus.  She is compliant with her Flonase.  She is compliant with her blood pressure medicine.   Review of Systems  Constitutional: Negative.  Negative for chills and fever.  HENT: Negative.    Eyes:  Negative for blurred vision, discharge and redness.  Respiratory:  Positive for cough. Negative for shortness of breath and wheezing.   Cardiovascular: Negative.   Gastrointestinal:  Negative for abdominal pain.  Genitourinary: Negative.   Musculoskeletal: Negative.  Negative for joint pain and myalgias.  Skin:  Negative for rash.  Neurological:  Positive for dizziness. Negative for tingling, loss of consciousness, weakness and headaches.  Endo/Heme/Allergies:  Negative for polydipsia.     Current Outpatient Medications:    losartan-hydrochlorothiazide (HYZAAR) 100-25 MG tablet, TAKE 1  TABLET BY MOUTH DAILY, Disp: 90 tablet, Rfl: 3   meclizine (ANTIVERT) 25 MG tablet, Take 1 tablet (25 mg total) by mouth 3 (three) times daily as needed for dizziness., Disp: 30 tablet, Rfl: 0   montelukast (SINGULAIR) 10 MG tablet, TAKE 1 TABLET(10 MG) BY MOUTH AT BEDTIME, Disp: 90 tablet, Rfl: 1   Multiple Vitamin (MULTI-VITAMIN) tablet, Take 1 tablet by mouth daily., Disp: , Rfl:    potassium chloride (KLOR-CON M) 10 MEQ tablet, Take 1 tablet (10 mEq total) by mouth 2 (two) times daily., Disp: 60 tablet, Rfl: 11   promethazine-dextromethorphan (PROMETHAZINE-DM) 6.25-15 MG/5ML syrup, Take 5 mLs by mouth 4 (four) times daily as needed for cough., Disp: 118 mL, Rfl: 0   rosuvastatin (CRESTOR) 40 MG tablet, Take 1 tablet (40 mg total) by mouth daily., Disp: 90 tablet, Rfl: 1   traMADol (ULTRAM) 50 MG tablet, Take 1 tablet (50 mg total) by mouth 2 (two) times daily as needed. Fill for 30 days at a time- not 1 week, please- if problem, and need Prior auth- please let us know-, Disp: 60 tablet, Rfl: 5   albuterol (VENTOLIN HFA) 108 (90 Base) MCG/ACT inhaler, INHALE 2 PUFFS INTO THE LUNGS EVERY 6 HOURS AS NEEDED, Disp: 6.7 g, Rfl: 6   ALPRAZolam (XANAX) 0.5 MG tablet, Take 1 tablet (0.5 mg total) by mouth daily as needed for flying, Disp: 2 tablet, Rfl: 0   ezetimibe (ZETIA) 10 MG tablet, TAKE 1 TABLET(10 MG) BY MOUTH DAILY, Disp: 90 tablet, Rfl: 3   fluticasone (FLONASE)  50 MCG/ACT nasal spray, Place 2 sprays into both nostrils daily., Disp: 16 g, Rfl: 6   Fluticasone Furoate 200 MCG/ACT AEPB, Inhale 1 Inhalation into the lungs daily., Disp: 30 each, Rfl: 11   levocetirizine (XYZAL) 5 MG tablet, Take 5 mg by mouth daily as needed for allergies., Disp: , Rfl:    Objective:     BP (!) 142/82   Pulse 81   Temp 97.7 F (36.5 C)   Ht 6' (1.829 m)   Wt 229 lb 6.4 oz (104.1 kg)   SpO2 97%   BMI 31.11 kg/m    Physical Exam Constitutional:      General: She is not in acute distress.    Appearance:  Normal appearance. She is not ill-appearing, toxic-appearing or diaphoretic.  HENT:     Head: Normocephalic and atraumatic.     Right Ear: Tympanic membrane, ear canal and external ear normal.     Left Ear: Tympanic membrane, ear canal and external ear normal.     Mouth/Throat:     Mouth: Mucous membranes are moist.     Pharynx: Oropharynx is clear. No oropharyngeal exudate or posterior oropharyngeal erythema.  Eyes:     General: No scleral icterus.       Right eye: No discharge.        Left eye: No discharge.     Extraocular Movements: Extraocular movements intact.     Conjunctiva/sclera: Conjunctivae normal.     Pupils: Pupils are equal, round, and reactive to light.     Comments: Nystagmus with left lateral gaze  Cardiovascular:     Rate and Rhythm: Normal rate and regular rhythm.  Pulmonary:     Effort: Pulmonary effort is normal. No respiratory distress.     Breath sounds: Normal breath sounds.  Musculoskeletal:     Cervical back: No rigidity or tenderness.  Skin:    General: Skin is warm and dry.  Neurological:     Mental Status: She is alert and oriented to person, place, and time.  Psychiatric:        Mood and Affect: Mood normal.        Behavior: Behavior normal.      No results found for any visits on 04/27/23.    The ASCVD Risk score (Arnett DK, et al., 2019) failed to calculate for the following reasons:   The valid total cholesterol range is 130 to 320 mg/dL    Assessment & Plan:   Labyrinthitis of left ear -     Meclizine HCl; Take 1 tablet (25 mg total) by mouth 3 (three) times daily as needed for dizziness.  Dispense: 30 tablet; Refill: 0  Viral upper respiratory tract infection -     Promethazine-DM; Take 5 mLs by mouth 4 (four) times daily as needed for cough.  Dispense: 118 mL; Refill: 0  Essential hypertension    Return Schedule follow-up with Dr. Veto Kemps if not improving in a week.Marland Kitchen  Avoid Alka-Seltzer plus which may be raising your blood  pressure.  Be sure to use the Flonase daily.  Mliss Sax, MD

## 2023-04-30 NOTE — Telephone Encounter (Signed)
 Copied from CRM [id number]. Topic: General - Other >> Apr 30, 2023 10:37 AM Fonda Kinder J wrote: Reason for CRM: Dr. Earnest Conroy is requesting a peer to peer with Dr.Rudd to see if there any restrictions or limitations. [place] # [id number]

## 2023-04-30 NOTE — Telephone Encounter (Signed)
 Left a VM to have them call back to set up a time to discuss this at 8:00 am before the day starts.   Dm/cma

## 2023-05-07 ENCOUNTER — Encounter: Payer: Self-pay | Admitting: Family Medicine

## 2023-05-12 NOTE — Telephone Encounter (Signed)
 Called pt to let her know filled out disability paperwork, however they were reaching out to me to get more info- explained I hadn't seen her for these issues, and she agreed her PCP Dr Jessica Huff would address for her-

## 2023-05-26 ENCOUNTER — Ambulatory Visit: Payer: Medicaid Other | Admitting: Family Medicine

## 2023-05-26 ENCOUNTER — Encounter: Payer: Self-pay | Admitting: Family Medicine

## 2023-05-26 VITALS — BP 128/70 | HR 73 | Temp 98.0°F | Ht 72.0 in | Wt 228.4 lb

## 2023-05-26 DIAGNOSIS — E876 Hypokalemia: Secondary | ICD-10-CM

## 2023-05-26 DIAGNOSIS — I10 Essential (primary) hypertension: Secondary | ICD-10-CM

## 2023-05-26 DIAGNOSIS — H8112 Benign paroxysmal vertigo, left ear: Secondary | ICD-10-CM | POA: Diagnosis not present

## 2023-05-26 LAB — BASIC METABOLIC PANEL WITH GFR
BUN: 10 mg/dL (ref 6–23)
CO2: 28 meq/L (ref 19–32)
Calcium: 9.2 mg/dL (ref 8.4–10.5)
Chloride: 105 meq/L (ref 96–112)
Creatinine, Ser: 0.71 mg/dL (ref 0.40–1.20)
GFR: 90.71 mL/min (ref >60.00)
Glucose, Bld: 118 mg/dL — ABNORMAL HIGH (ref 70–99)
Potassium: 3.7 meq/L (ref 3.5–5.1)
Sodium: 142 meq/L (ref 135–145)

## 2023-05-26 NOTE — Assessment & Plan Note (Signed)
 Jessica Huff potassium was low at her last visit. I started her on a potassium supplement. I will recheck her level today.

## 2023-05-26 NOTE — Assessment & Plan Note (Signed)
 Improved. We will manage this expectantly at this point.

## 2023-05-26 NOTE — Progress Notes (Signed)
 [facility] [address] [place] [address] Dept: [phone] Dept Fax: [phone]  Chronic Care Office Visit  Subjective:    Patient ID: Jessica Huff, female    DOB: [birth year], 63 y.o..   MRN: [id number]  Chief Complaint  Patient presents with   Hypertension    3 month f/u HTN.     History of Present Illness:  Patient is in today for reassessment of chronic medical issues.  [name] has a history of hypertension. She is currently on losartan-HCTZ (Hyzaar) 100-25 mg daily.   [name] has a history of hyperlipidemia. She is managed on rosuvastatin 40 mg daily and ezetimibe 10 mg daily.  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 She was treated with meclizine, but this did not seem to hlep. She notes this has calmed back down again now.  [name] has a history of hypertension. She is currently on losartan-HCTZ (Hyzaar) 100-25 mg daily.   [name] has a history of chronic low back pain with sciatica and a prior L4-L5 lumbar fusion. she sees Dr. Berline Chough related to issues with right leg weakness. [name] is needing to use a cane due to unsteadiness at times. Dr. Berline Chough feels she has some myofascial pain dysfunction. [name] also had a prior right total knee joint arthroplasty. She does have regular stiffness in her knees. She has not been able to return to work since all of these issues started. Her previous job entailed standing all day. Her employer could not provide an accomodation for her to work seated.  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   Malaise and fatigue 02/07/2021   Prediabetes 10/19/2018   S/P lumbar fusion 09/16/2018   Asthma in adult,  moderate persistent, uncomplicated 07/03/2016   Chronic pain of right ankle 03/02/2016   Allergic rhinitis 11/29/2015   Class 1 obesity due to excess calories with serious comorbidity and body mass index (BMI) of 32.0 to 32.9 in adult 11/29/2015   Benign positional vertigo 04/07/2015   Chronic bilateral low back pain with sciatica 04/07/2015   Common migraine 04/07/2015   Hyperlipidemia 04/07/2015   Essential hypertension 04/07/2015   Insomnia 04/07/2015   Obstructive sleep apnea 04/07/2015   Lumbar radiculopathy 04/07/2015   Anxiety 04/07/2015   Past Surgical History:  Procedure Laterality Date   ANKLE SURGERY Right 2018   LUMBAR FUSION  2020   L4-L5   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   Procedure: RIGHT TOTAL KNEE ARTHROPLASTY;  Surgeon: Nadara Mustard, MD;  Location: [facility] OR;  Service: Orthopedics;  Laterality: Right;   Family History  Problem Relation Age of Onset   Diabetes Mother    Kidney disease Mother    Cancer Father        Kidney   Outpatient Medications Prior to Visit  Medication Sig Dispense Refill   albuterol (VENTOLIN HFA) 108 (90 Base) MCG/ACT inhaler INHALE 2 PUFFS INTO THE LUNGS EVERY 6 HOURS AS NEEDED 6.7 g 6   ALPRAZolam (XANAX) 0.5 MG tablet Take 1 tablet (0.5 mg total) by mouth daily as needed for flying 2 tablet 0   ezetimibe (ZETIA) 10 MG tablet TAKE 1 TABLET(10 MG) BY MOUTH DAILY 90 tablet 3   fluticasone (  FLONASE) 50 MCG/ACT nasal spray Place 2 sprays into both nostrils daily. 16 g 6   Fluticasone Furoate 200 MCG/ACT AEPB Inhale 1 Inhalation into the lungs daily. 30 each 11   levocetirizine (XYZAL) 5 MG tablet Take 5 mg by mouth daily as needed for allergies.     losartan-hydrochlorothiazide (HYZAAR) 100-25 MG tablet TAKE 1 TABLET BY MOUTH DAILY 90 tablet 3   meclizine (ANTIVERT) 25 MG tablet Take 1 tablet (25 mg total) by mouth 3 (three) times daily as needed for dizziness. 30 tablet 0   montelukast (SINGULAIR) 10 MG tablet TAKE 1 TABLET(10 MG) BY  MOUTH AT BEDTIME 90 tablet 1   Multiple Vitamin (MULTI-VITAMIN) tablet Take 1 tablet by mouth daily.     potassium chloride (KLOR-CON M) 10 MEQ tablet Take 1 tablet (10 mEq total) by mouth 2 (two) times daily. 60 tablet 11   rosuvastatin (CRESTOR) 40 MG tablet Take 1 tablet (40 mg total) by mouth daily. 90 tablet 1   traMADol (ULTRAM) 50 MG tablet Take 1 tablet (50 mg total) by mouth 2 (two) times daily as needed. Fill for 30 days at a time- not 1 week, please- if problem, and need Prior auth- please let us know- 60 tablet 5   promethazine-dextromethorphan (PROMETHAZINE-DM) 6.25-15 MG/5ML syrup Take 5 mLs by mouth 4 (four) times daily as needed for cough. 118 mL 0   No facility-administered medications prior to visit.   No Known Allergies Objective:   Today's Vitals   05/26/23 1018  BP: 128/70  Pulse: 73  Temp: 98 F (36.7 C)  TempSrc: Temporal  SpO2: 97%  Weight: 228 lb 6.4 oz (103.6 kg)  Height: 6' (1.829 m)   Body mass index is 30.98 kg/m.   General: Well developed, well nourished. No acute distress. Psych: Alert and oriented. Normal mood and affect.  Health Maintenance Due  Topic Date Due   Pneumococcal Vaccine 24-43 Years old (1 of 2 - PCV) Never done   Assessment & Plan:   Problem List Items Addressed This Visit       Cardiovascular and Mediastinum   Essential hypertension - Primary   Blood pressure is in good control. Continue losartan-HCTZ (Hyzaar) 100-25 mg daily.        Nervous and Auditory   Benign positional vertigo   Improved. We will manage this expectantly at this point.        Other   Hypokalemia   [name]'s potassium was low at her last visit. I started her on a potassium supplement. I will recheck her level today.      Relevant Orders   Basic metabolic panel with GFR    Return in about 3 months (around 08/25/2023).   Loyola Mast, MD

## 2023-05-26 NOTE — Assessment & Plan Note (Signed)
Blood pressure is in good control. Continue losartan-HCTZ (Hyzaar) 100-25 mg daily.

## 2023-06-09 ENCOUNTER — Ambulatory Visit: Payer: Self-pay | Admitting: Physical Medicine and Rehabilitation

## 2023-06-12 IMAGING — MR MR LUMBAR SPINE W/O CM
5 series · 41 of 48 positions shown · non-contrast
Comparison: Lumbar spine MRI 03/15/2019

CLINICAL DATA: Right leg weakness, chronic low back pain and
bilateral leg weakness, multiple recent falls

EXAM:
MRI LUMBAR SPINE WITHOUT CONTRAST
TECHNIQUE: Multiplanar, multisequence MR imaging of the lumbar spine was
performed. No intravenous contrast was administered.

[Series 3: T1 · sagittal · 4.0mm · 0.88mm/px · 6 of 15 slices shown (1 of 2)]
[im 1/15]
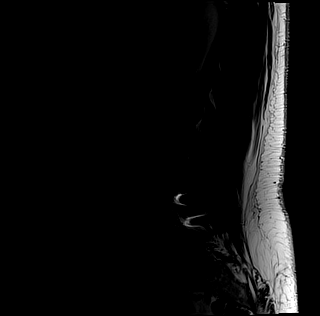
[im 3/15]
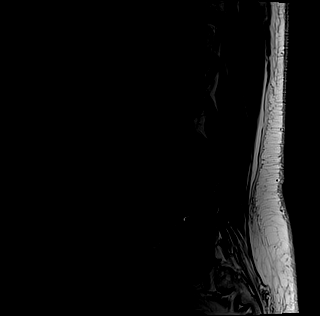
[im 6/15]
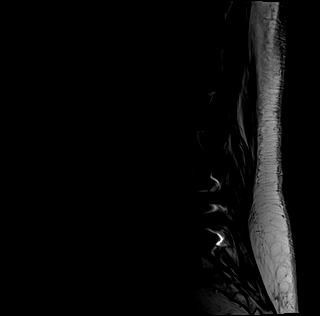
[im 9/15]
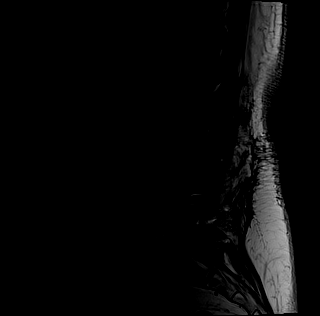
[im 12/15]
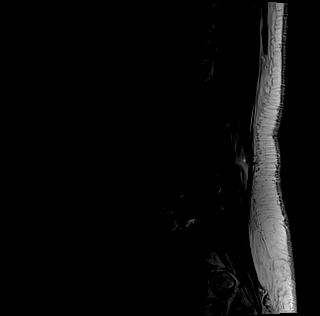
[im 15/15]
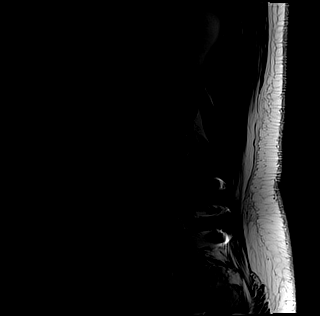

[Series 4: T2 · sagittal · 4.0mm · 0.88mm/px · 6 of 15 slices shown (1 of 2)]
[im 1/15]
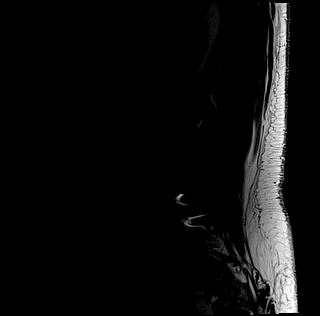
[im 3/15]
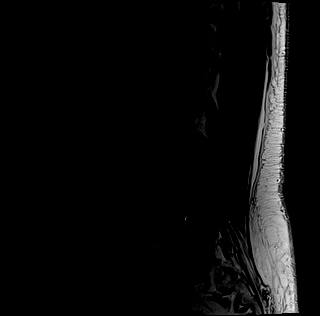
[im 6/15]
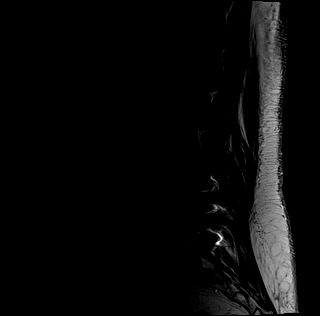
[im 9/15]
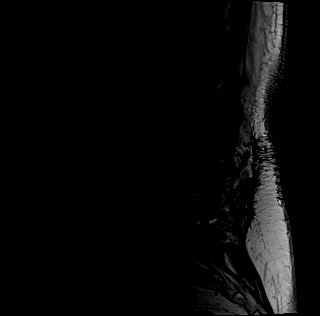
[im 12/15]
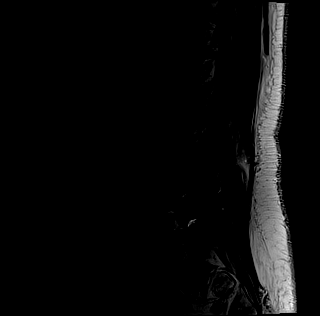
[im 15/15]
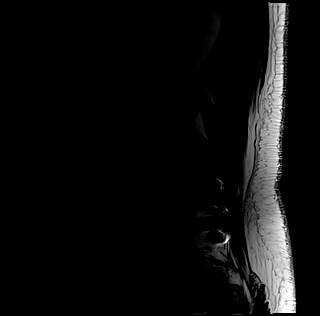

[Series 5: tirm sag · sagittal · 4.0mm · 0.55mm/px · 6 of 15 slices shown]
[im 1/15]
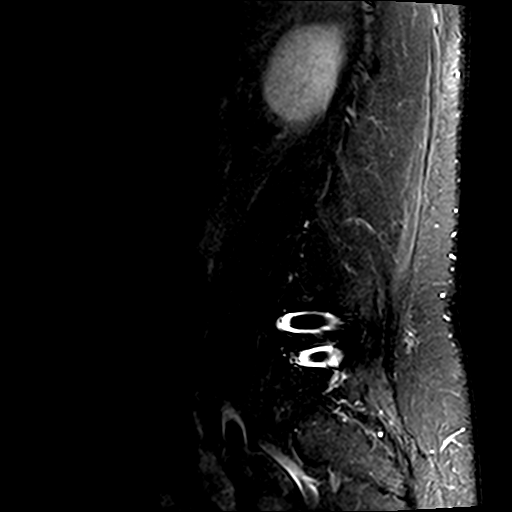
[im 3/15]
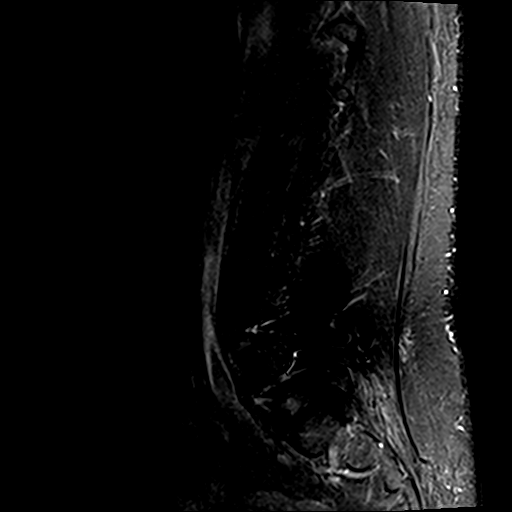
[im 6/15]
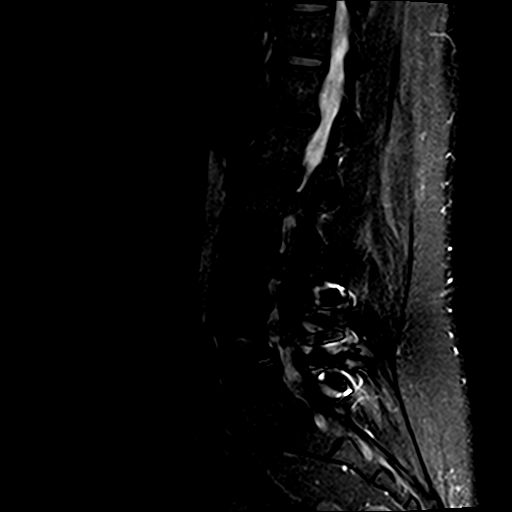
[im 9/15]
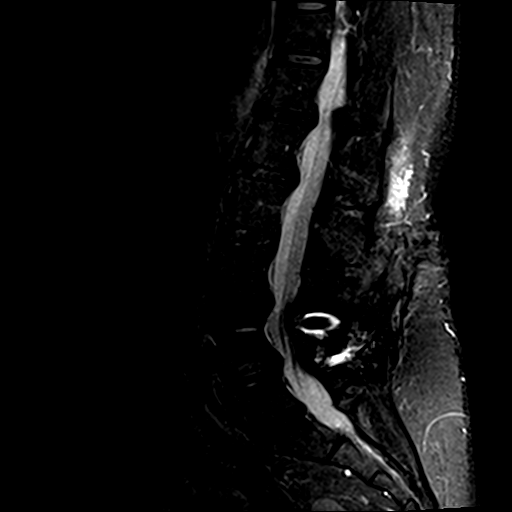
[im 12/15]
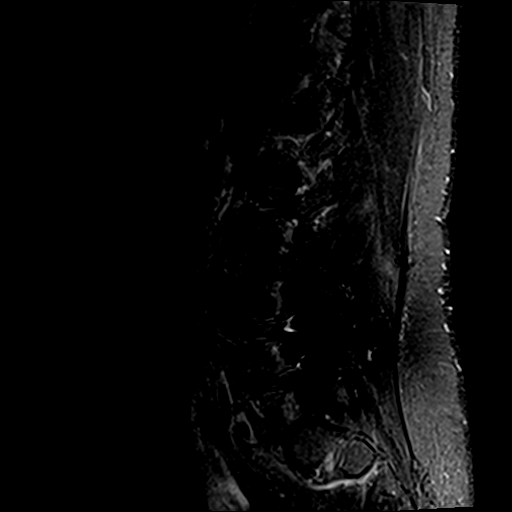
[im 15/15]
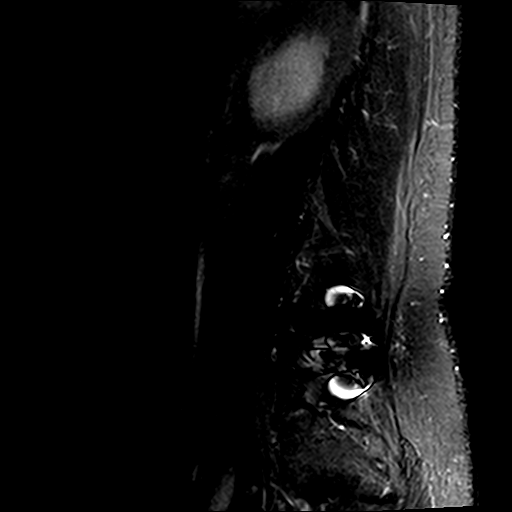

[Series 6: T1 · axial · 4.0mm · 0.70mm/px · z∈[-42,+163]mm · 9 of 41 slices shown (2 of 2)]
[im 1/41]
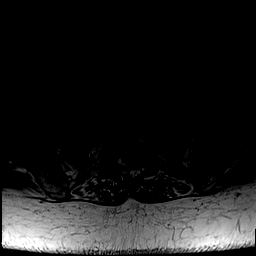
[im 6/41]
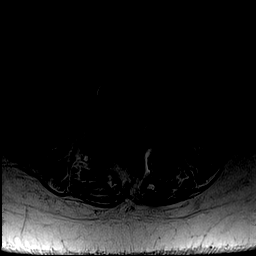
[im 12/41]
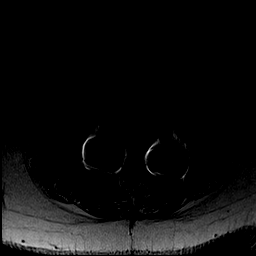
[im 18/41]
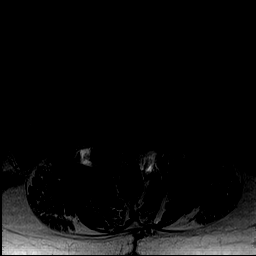
[im 21/41]
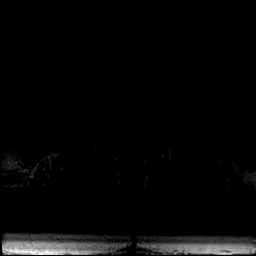
[im 23/41]
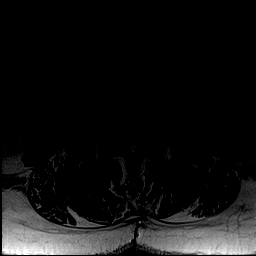
[im 29/41]
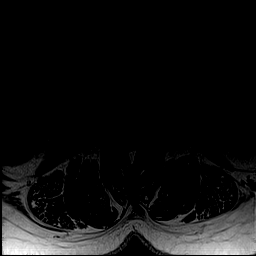
[im 35/41]
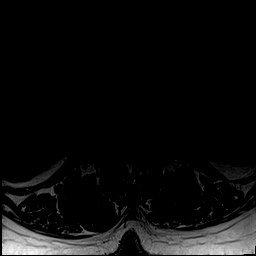
[im 41/41]
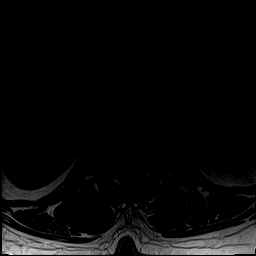

[Series 7: T2 · axial · 4.0mm · 0.70mm/px · z∈[-42,+163]mm · 14 of 41 slices shown (2 of 2)]
[im 1/41]
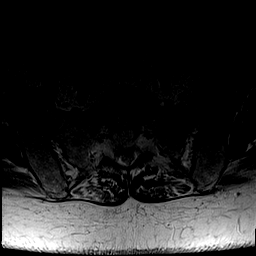
[im 3/41]
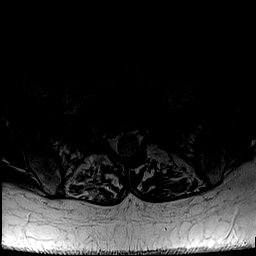
[im 6/41]
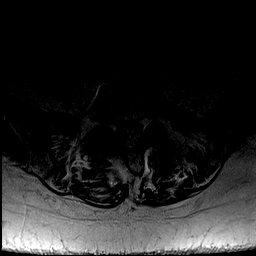
[im 9/41]
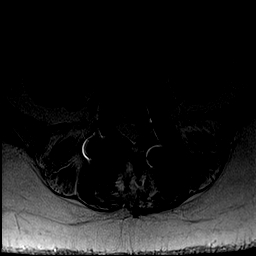
[im 12/41]
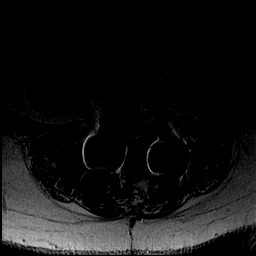
[im 15/41]
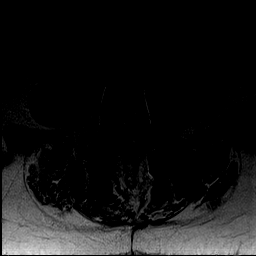
[im 18/41]
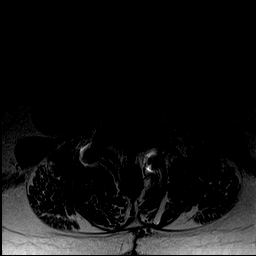
[im 21/41]
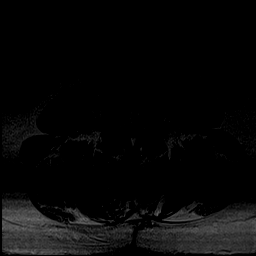
[im 23/41]
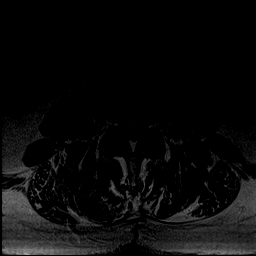
[im 26/41]
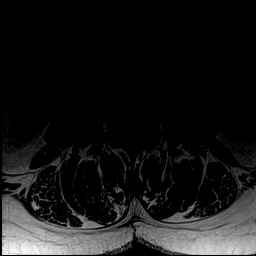
[im 29/41]
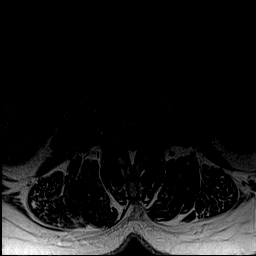
[im 32/41]
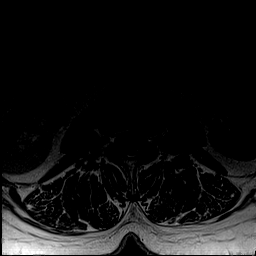
[im 35/41]
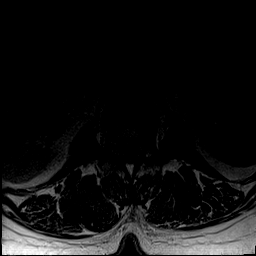
[im 41/41]
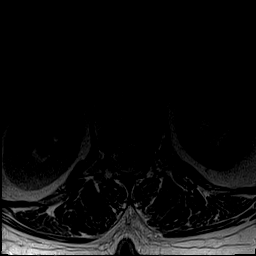

[41 of 48 positions shown; findings below may reference images not displayed]

FINDINGS: Segmentation: Standard; the lowest formed disc space is designated
L5-S1.

Alignment:  Normal.

Vertebrae: The patient is status post posterior instrumented fusion
at L4-L5 without evidence of complication. Marrow signal is normal,
without focal or suspicious marrow signal abnormality.

Conus medullaris and cauda equina: Conus extends to the mid L1
level. Conus and cauda equina appear normal.

Paraspinal and other soft tissues: Unremarkable.

Disc levels:

The disc spaces are overall preserved. There is relatively mild
multilevel facet arthropathy.

T12-L1: No significant spinal canal or neural foraminal stenosis.

L1-L2: There is a mild disc bulge without significant spinal canal
or neural foraminal stenosis, unchanged.

L2-L3: There is a minimal disc bulge and mild bilateral facet
arthropathy without significant spinal canal or neural foraminal
stenosis, unchanged.

L3-L4: There is a mild disc bulge and bilateral facet arthropathy
resulting in mild crowding of the bilateral subarticular zones
without definite evidence of nerve root impingement and mild
bilateral neural foraminal stenosis, not significantly changed.

L4-L5: There is a mild disc bulge and bilateral facet arthropathy
resulting in mild crowding of the left subarticular zone without
evidence of nerve root impingement and mild left worse than right
neural foraminal stenosis, not significantly changed.

L5-S1: There is a broad-based right paracentral disc protrusion and
annular fissure which contacts the traversing right S1 nerve root
without definite evidence of impingement. There is mild bilateral
facet arthropathy without significant spinal canal or neural
foraminal stenosis. Findings are not significantly changed.
IMPRESSION: 1. Status post posterior instrumented fusion at L4-L5 without
evidence of complication.
2. Overall mild multilevel degenerative changes throughout the
lumbar spine detailed above are not significantly changed since
2524, including mild bilateral subarticular zone and neural
foraminal narrowing at L3-L4, mild left subarticular zone and mild
left worse than right neural foraminal stenosis at L4-L5, and mild
right subarticular zone narrowing at L5-S1, without evidence of
nerve root impingement.

## 2023-06-16 ENCOUNTER — Encounter: Payer: Self-pay | Admitting: Physical Medicine and Rehabilitation

## 2023-06-16 ENCOUNTER — Encounter: Payer: Self-pay | Attending: Physical Medicine and Rehabilitation | Admitting: Physical Medicine and Rehabilitation

## 2023-06-16 VITALS — BP 143/81 | HR 74 | Ht 72.0 in | Wt 231.4 lb

## 2023-06-16 DIAGNOSIS — G8929 Other chronic pain: Secondary | ICD-10-CM | POA: Insufficient documentation

## 2023-06-16 DIAGNOSIS — M7918 Myalgia, other site: Secondary | ICD-10-CM | POA: Diagnosis not present

## 2023-06-16 DIAGNOSIS — M5416 Radiculopathy, lumbar region: Secondary | ICD-10-CM | POA: Diagnosis not present

## 2023-06-16 DIAGNOSIS — M5441 Lumbago with sciatica, right side: Secondary | ICD-10-CM | POA: Diagnosis present

## 2023-06-16 DIAGNOSIS — Z981 Arthrodesis status: Secondary | ICD-10-CM | POA: Diagnosis not present

## 2023-06-16 MED ORDER — TRAMADOL HCL 50 MG PO TABS: 50.0000 mg | ORAL_TABLET | Freq: Two times a day (BID) | ORAL | 5 refills | Status: DC

## 2023-06-16 MED ORDER — LIDOCAINE HCL 1 % IJ SOLN
3.0000 mL | Freq: Once | INTRAMUSCULAR | Status: AC
Start: 2023-06-16 — End: 2023-06-16
  Administered 2023-06-16: 3 mL

## 2023-06-16 NOTE — Progress Notes (Signed)
 Subjective:    Patient ID: Jessica Huff, female    DOB: December 23, 1960, 63 y.o.   MRN: [id number]  HPI  Pt is a 63 yr old female with hx of asthma, prediabetes; HTN, BMI of 32- down to 31.38; and chronic low back pain with sciatica as well as hx of lumbar fusion- L4/5 had surgery for back pain. Here for evaluation of back pain s/p L4/5 back fusion and new RLE weakness.  Here for f/u on back pain/RLE weakness. And trigger point injections.   Was in real bad pain-  Couldn't come in because they cancelled medicaid.  Got marketplace-   Has [employer] now- from [employer] with [employer]- on June 1st.   Tramadol - still getting- is helping some as long as pain not too bad. .    Pain moved to L side- not so much R side anymore.   Started going to [employer]- has helped a lot- and has reduced her pain levels.  Nothing was helping there for awhile.  $79/month for 4 visits/month.    Doing HEP- 2 days/week sits on the ball al the time- and hasn't used theracane in awhile- got it just in case.   Finally got approval for disability.   Pain Inventory Average Pain 3 Pain Right Now 3 My pain is constant and sharp  In the last 24 hours, has pain interfered with the following? General activity 3 Relation with others 3 Enjoyment of life 8 What TIME of day is your pain at its worst? daytime Sleep (in general) Poor  Pain is worse with: walking, bending, sitting, standing, and some activites Pain improves with: rest, heat/ice, therapy/exercise, medication, and injections Relief from Meds: 5  Family History  Problem Relation Age of Onset   Diabetes Mother    Kidney disease Mother    Cancer Father        Kidney   Social History   Socioeconomic History   Marital status: Divorced    Spouse name: Not on file   Number of children: 2   Years of education: Not on file   Highest education level: 10th grade  Occupational History   Not on file  Tobacco Use   Smoking status:  Never   Smokeless tobacco: Never  Vaping Use   Vaping status: Never Used  Substance and Sexual Activity   Alcohol use: Yes    Comment: socially   Drug use: Never   Sexual activity: Yes  Other Topics Concern   Not on file  Social History Narrative   Not on file   Social Drivers of Health   Financial Resource Strain: Medium Risk (02/23/2023)   Overall Financial Resource Strain (CARDIA)    Difficulty of Paying Living Expenses: Somewhat hard  Food Insecurity: No Food Insecurity (02/23/2023)   Hunger Vital Sign    Worried About Running Out of Food in the Last Year: Never true    Ran Out of Food in the Last Year: Never true  Transportation Needs: Unmet Transportation Needs (02/23/2023)   PRAPARE - Administrator, Civil Service (Medical): Yes    Lack of Transportation (Non-Medical): No  Physical Activity: Insufficiently Active (02/23/2023)   Exercise Vital Sign    Days of Exercise per Week: 1 day    Minutes of Exercise per Session: 10 min  Stress: Stress Concern Present (02/23/2023)   [employer] of Occupational Health - Occupational Stress Questionnaire    Feeling of Stress : To some extent  Social Connections: Socially  Isolated (02/23/2023)   Social Connection and Isolation Panel [NHANES]    Frequency of Communication with Friends and Family: Once a week    Frequency of Social Gatherings with Friends and Family: Never    Attends Religious Services: Never    Database administrator or Organizations: No    Attends Engineer, structural: Not on file    Marital Status: Divorced   Past Surgical History:  Procedure Laterality Date   ANKLE SURGERY Right 2018   LUMBAR FUSION  2020   L4-L5   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   Procedure: RIGHT TOTAL KNEE ARTHROPLASTY;  Surgeon: Timothy Ford, MD;  Location: [facility] OR;  Service: Orthopedics;  Laterality: Right;   Past Surgical History:  Procedure Laterality Date   ANKLE SURGERY Right 2018   LUMBAR  FUSION  2020   L4-L5   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   Procedure: RIGHT TOTAL KNEE ARTHROPLASTY;  Surgeon: Timothy Ford, MD;  Location: [facility];  Service: Orthopedics;  Laterality: Right;   Past Medical History:  Diagnosis Date   Asthma    GERD (gastroesophageal reflux disease)    Hyperlipidemia    Hypertension    BP (!) 143/81   Pulse 74   Ht 6' (1.829 m)   Wt 231 lb 6.4 oz (105 kg)   SpO2 97%   BMI 31.38 kg/m   Opioid Risk Score:   Fall Risk Score:  `1  Depression screen [facility] 2/9     06/16/2023   11:34 AM 05/26/2023   10:51 AM 01/06/2023    9:48 AM 11/24/2022   10:52 AM 10/07/2022    9:12 AM 07/06/2022   11:18 AM 05/07/2022   11:20 AM  Depression screen PHQ 2/9  Decreased Interest 1 1 0 0 0 0 0  Down, Depressed, Hopeless 1 1 0 0 0 0 0  PHQ - 2 Score 2 2 0 0 0 0 0  Altered sleeping  3  3     Tired, decreased energy  3  3     Change in appetite  0  3     Feeling bad or failure about yourself   0  0     Trouble concentrating  1  0     Moving slowly or fidgety/restless  0  0     Suicidal thoughts  0  0     PHQ-9 Score  9  9     Difficult doing work/chores  Somewhat difficult  Somewhat difficult        Review of Systems  Musculoskeletal:        Right shoulder an left back  All other systems reviewed and are negative.      Objective:   Physical Exam Awake, alert, appropriate, NAD No AD Trigger points in shoulders, neck and upper and Llower back       Assessment & Plan:   Pt is a 63 yr old female with hx of asthma, prediabetes; HTN, BMI of 32- down to 31.38; and chronic low back pain with sciatica as well as hx of lumbar fusion- L4/5 had surgery for back pain. Here for evaluation of back pain s/p L4/5 back fusion and new RLE weakness.  Here for f/u on back pain/RLE weakness. And trigger point injections.     Tramadol -  has 3 more refills but have ot use by 5/19- so will refill - with 6 months of refills.   2.  Needs to do Home  exercise program 5  days/week so when gets back on medicare, go to gym or do home stretching exercises.    3. Patient here for trigger point injections for  Consent done and on chart.  Cleaned areas with alcohol and injected using a 27 gauge 1.5 inch needle  Injected 3cc- none wasted Using 1% Lidocaine  with no EPI  Upper traps B/L  Levators B/L  Posterior scalenes Middle scalenes B/L  Splenius Capitus R only Pectoralis Major Rhomboids Infraspinatus Teres Major/minor Thoracic paraspinals Lumbar paraspinals L lumbar x3 Other injections-      There was no bleeding or complications.  Patient was advised to drink a lot of water on day after injections to flush system Will have increased soreness for 12-48 hours after injections.  Can use Lidocaine  patches the day AFTER injections Can use theracane on day of injections in places didn't inject Can use heating pad 4-6 hours AFTER injections   4. Don't have to do oral drug screen today- per clinic policy- was done 11/2022.    5. F/U  3 months- for trp injections and f/u on pain/on tramadol .    I spent a total of  29  minutes on total care today- >50% coordination of care- due to 5 minutes on trp injections- d/w pain control and d/w nursing about getting approval for trp injections.

## 2023-06-16 NOTE — Patient Instructions (Signed)
 Pt is a 63 yr old female with hx of asthma, prediabetes; HTN, BMI of 32- down to 31.38; and chronic low back pain with sciatica as well as hx of lumbar fusion- L4/5 had surgery for back pain. Here for evaluation of back pain s/p L4/5 back fusion and new RLE weakness.  Here for f/u on back pain/RLE weakness. And trigger point injections.     Tramadol -  has 3 more refills but have ot use by 5/19- so will refill - with 6 months of refills.   2.  Needs to do Home exercise program 5 days/week so when gets back on medicare, go to gym or do home stretching exercises.    3. Patient here for trigger point injections for  Consent done and on chart.  Cleaned areas with alcohol and injected using a 27 gauge 1.5 inch needle  Injected 3cc- none wasted Using 1% Lidocaine  with no EPI  Upper traps B/L  Levators B/L  Posterior scalenes Middle scalenes B/L  Splenius Capitus R only Pectoralis Major Rhomboids Infraspinatus Teres Major/minor Thoracic paraspinals Lumbar paraspinals L lumbar x3 Other injections-      There was no bleeding or complications.  Patient was advised to drink a lot of water on day after injections to flush system Will have increased soreness for 12-48 hours after injections.  Can use Lidocaine  patches the day AFTER injections Can use theracane on day of injections in places didn't inject Can use heating pad 4-6 hours AFTER injections   4. Don't have to do oral drug screen today- per clinc policy- was done 11/2022.    5. F/U  3 months- for trp injections and f/u on pain/on tramadol .

## 2023-06-22 DIAGNOSIS — G4733 Obstructive sleep apnea (adult) (pediatric): Secondary | ICD-10-CM | POA: Diagnosis not present

## 2023-06-29 ENCOUNTER — Other Ambulatory Visit: Payer: Self-pay | Admitting: Family Medicine

## 2023-06-30 MED ORDER — LEVOCETIRIZINE DIHYDROCHLORIDE 5 MG PO TABS: 5.0000 mg | ORAL_TABLET | Freq: Every day | ORAL | 1 refills | Status: DC | PRN

## 2023-06-30 NOTE — Telephone Encounter (Signed)
 Requesting: levocetirizine (XYZAL) 5 MG tablet  Last Visit: 05/26/2023 Next Visit: 08/25/2023 Last Refill: 07/17/2019 by Historical Provider  Please Advise

## 2023-08-05 ENCOUNTER — Other Ambulatory Visit: Payer: Self-pay | Admitting: Family Medicine

## 2023-08-05 DIAGNOSIS — E785 Hyperlipidemia, unspecified: Secondary | ICD-10-CM

## 2023-08-14 ENCOUNTER — Other Ambulatory Visit: Payer: Self-pay | Admitting: Family Medicine

## 2023-08-14 DIAGNOSIS — I10 Essential (primary) hypertension: Secondary | ICD-10-CM

## 2023-08-25 ENCOUNTER — Ambulatory Visit (INDEPENDENT_AMBULATORY_CARE_PROVIDER_SITE_OTHER): Admitting: Family Medicine

## 2023-08-25 ENCOUNTER — Encounter: Payer: Self-pay | Admitting: Family Medicine

## 2023-08-25 VITALS — BP 122/70 | HR 87 | Temp 97.6°F | Ht 72.0 in | Wt 230.4 lb

## 2023-08-25 DIAGNOSIS — I10 Essential (primary) hypertension: Secondary | ICD-10-CM | POA: Diagnosis not present

## 2023-08-25 DIAGNOSIS — F5101 Primary insomnia: Secondary | ICD-10-CM

## 2023-08-25 DIAGNOSIS — Z23 Encounter for immunization: Secondary | ICD-10-CM

## 2023-08-25 MED ORDER — TRAZODONE HCL 50 MG PO TABS
25.0000 mg | ORAL_TABLET | Freq: Every evening | ORAL | 3 refills | Status: DC | PRN
Start: 2023-08-25 — End: 2023-12-09

## 2023-08-25 NOTE — Progress Notes (Signed)
 [facility] [address] [place] [address] Dept: [phone] Dept Fax: [phone]  Chronic Care Office Visit  Subjective:    Patient ID: Jessica Huff, female    DOB: Dec 14, 1960, 63 y.o..   MRN: [id number]  Chief Complaint  Patient presents with   Hypertension    3 month f/u HTN.  No concerns.     History of Present Illness:  Patient is in today for reassessment of chronic medical issues.  Ms. Lizardi has a history of hypertension. She is currently on losartan -HCTZ (Hyzaar) 100-25 mg daily.   Ms. Mattila has a history of hyperlipidemia. She is managed on rosuvastatin  40 mg daily and ezetimibe  10 mg daily.  Ms. Cassetta has a history of insomnia. she notes that most nights she has difficulty falling sleep. Last night, she went to bed before midnight, but didn't fall asleep until after 3 am. She notes that she often has thoguhts that are running through her mind. She has not tried taking medication for this.  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   Malaise and fatigue 02/07/2021   Prediabetes 10/19/2018   S/P lumbar fusion 09/16/2018   Asthma in adult, moderate persistent, uncomplicated 07/03/2016   Chronic pain of right ankle 03/02/2016   Allergic rhinitis 11/29/2015   Class 1 obesity due to excess calories with serious comorbidity and body mass index (BMI) of 32.0 to 32.9 in adult 11/29/2015   Benign positional vertigo 04/07/2015   Chronic bilateral low back pain with sciatica 04/07/2015   Common migraine 04/07/2015   Hyperlipidemia 04/07/2015   Essential hypertension 04/07/2015   Insomnia 04/07/2015   Obstructive sleep apnea 04/07/2015   Lumbar radiculopathy  04/07/2015   Anxiety 04/07/2015   Past Surgical History:  Procedure Laterality Date   ANKLE SURGERY Right 2018   LUMBAR FUSION  2020   L4-L5   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   Procedure: RIGHT TOTAL KNEE ARTHROPLASTY;  Surgeon: Harden Jerona GAILS, MD;  Location: [facility] OR;  Service: Orthopedics;  Laterality: Right;   Family History  Problem Relation Age of Onset   Diabetes Mother    Kidney disease Mother    Cancer Father        Kidney   Outpatient Medications Prior to Visit  Medication Sig Dispense Refill   albuterol  (VENTOLIN  HFA) 108 (90 Base) MCG/ACT inhaler INHALE 2 PUFFS INTO THE LUNGS EVERY 6 HOURS AS NEEDED 6.7 g 6   ALPRAZolam  (XANAX ) 0.5 MG tablet Take 1 tablet (0.5 mg total) by mouth daily as needed for flying 2 tablet 0   ezetimibe  (ZETIA ) 10 MG tablet TAKE 1 TABLET(10 MG) BY MOUTH DAILY 90 tablet 3   fluticasone  (FLONASE ) 50 MCG/ACT nasal spray Place 2 sprays into both nostrils daily. 16 g 6   Fluticasone  Furoate 200 MCG/ACT AEPB Inhale 1 Inhalation into the lungs daily. 30 each 11   levocetirizine (XYZAL ) 5 MG tablet Take 1 tablet (5 mg total) by mouth daily as needed for allergies. 90 tablet 1   losartan -hydrochlorothiazide  (HYZAAR) 100-25 MG tablet TAKE 1 TABLET BY MOUTH DAILY 90 tablet 3   meclizine  (ANTIVERT ) 25 MG tablet Take 1 tablet (25 mg total) by mouth 3 (three) times daily as needed for dizziness. 30  tablet 0   montelukast  (SINGULAIR ) 10 MG tablet TAKE 1 TABLET(10 MG) BY MOUTH AT BEDTIME 90 tablet 1   Multiple Vitamin (MULTI-VITAMIN) tablet Take 1 tablet by mouth daily.     potassium chloride  (KLOR-CON  M) 10 MEQ tablet Take 1 tablet (10 mEq total) by mouth 2 (two) times daily. 60 tablet 11   rosuvastatin  (CRESTOR ) 40 MG tablet TAKE 1 TABLET(40 MG) BY MOUTH DAILY 90 tablet 1   traMADol  (ULTRAM ) 50 MG tablet Take 1 tablet (50 mg total) by mouth 2 (two) times daily. For back pain 60 tablet 5   No facility-administered medications prior to visit.   No Known  Allergies Objective:   Today's Vitals   08/25/23 1027  BP: 122/70  Pulse: 87  Temp: 97.6 F (36.4 C)  TempSrc: Temporal  SpO2: 96%  Weight: 230 lb 6.4 oz (104.5 kg)  Height: 6' (1.829 m)   Body mass index is 31.25 kg/m.   General: Well developed, well nourished. No acute distress. Psych: Alert and oriented. Normal mood and affect.  Health Maintenance Due  Topic Date Due   Medicare Annual Wellness (AWV)  Never done   Pneumococcal Vaccine 95-69 Years old (1 of 2 - PCV) Never done     Assessment & Plan:   Problem List Items Addressed This Visit       Cardiovascular and Mediastinum   Essential hypertension - Primary   Blood pressure is in good control. Continue losartan -HCTZ (Hyzaar) 100-25 mg daily.        Other   Insomnia   Discussed sleep hygiene and approaches to help her fall asleep. Advised that CBT has the best results long-term for insomnia. We are going to try some trazodone at bedtime to see if this helps.      Relevant Medications   traZODone (DESYREL) 50 MG tablet   Other Visit Diagnoses       Need for pneumococcal 20-valent conjugate vaccination       Relevant Orders   Pneumococcal conjugate vaccine 20-valent       Return in about 3 months (around 11/25/2023) for Reassessment.   Garnette CHRISTELLA Simpler, MD

## 2023-08-25 NOTE — Assessment & Plan Note (Signed)
 Discussed sleep hygiene and approaches to help her fall asleep. Advised that CBT has the best results long-term for insomnia. We are going to try some trazodone at bedtime to see if this helps.

## 2023-08-25 NOTE — Assessment & Plan Note (Signed)
Blood pressure is in good control. Continue losartan-HCTZ (Hyzaar) 100-25 mg daily.

## 2023-09-07 ENCOUNTER — Other Ambulatory Visit: Payer: Self-pay | Admitting: Family Medicine

## 2023-09-07 DIAGNOSIS — J301 Allergic rhinitis due to pollen: Secondary | ICD-10-CM

## 2023-09-07 DIAGNOSIS — E785 Hyperlipidemia, unspecified: Secondary | ICD-10-CM

## 2023-09-20 DIAGNOSIS — G4733 Obstructive sleep apnea (adult) (pediatric): Secondary | ICD-10-CM | POA: Diagnosis not present

## 2023-09-24 ENCOUNTER — Encounter: Payer: Self-pay | Admitting: Physical Medicine and Rehabilitation

## 2023-09-24 ENCOUNTER — Encounter: Attending: Physical Medicine and Rehabilitation | Admitting: Physical Medicine and Rehabilitation

## 2023-09-24 VITALS — BP 119/74 | HR 77 | Ht 72.0 in | Wt 234.0 lb

## 2023-09-24 DIAGNOSIS — G894 Chronic pain syndrome: Secondary | ICD-10-CM | POA: Diagnosis not present

## 2023-09-24 DIAGNOSIS — M7918 Myalgia, other site: Secondary | ICD-10-CM | POA: Insufficient documentation

## 2023-09-24 DIAGNOSIS — Z5181 Encounter for therapeutic drug level monitoring: Secondary | ICD-10-CM | POA: Insufficient documentation

## 2023-09-24 DIAGNOSIS — Z79891 Long term (current) use of opiate analgesic: Secondary | ICD-10-CM | POA: Insufficient documentation

## 2023-09-24 MED ORDER — LIDOCAINE HCL 1 % IJ SOLN
6.0000 mL | Freq: Once | INTRAMUSCULAR | Status: AC
  Administered 2023-09-24: 6 mL

## 2023-09-24 NOTE — Patient Instructions (Addendum)
 Plan: Patient here for trigger point injections for  Consent done and on chart.  Cleaned areas with alcohol and injected using a 27 gauge 1.5 inch needle  Injected 4.5 cc- wasted 1.5 cc Using 1% Lidocaine  with no EPI  Upper traps- B/L  Levators- B/L  Posterior scalenes Middle scalenes- b/L  Splenius Capitus Pectoralis Major - B/L  Rhomboids- B/L x2 Infraspinatus Teres Major/minor Thoracic paraspinals- B/L x2 Lumbar paraspinals- b/L x2 Other injections-    There was no bleeding or complications.  Patient was advised to drink a lot of water on day after injections to flush system Will have increased soreness for 12-48 hours after injections.  Can use Lidocaine  patches the day AFTER injections Can use theracane on day of injections in places didn't inject Can use heating pad 4-6 hours AFTER injections   2. Con't Tramadol  - last refill - May 19th 2025- doesn't need refills- has enough at this time- has to pick up tramadol  today.    3. Nex time, focus on legs for Trp Injections  4. F/U in 3months for f/u on pain and trp Injections  5 Oral drug screen done today.   6. Sleep hygiene- turn any electronic device off 1-2 hours before sleep- no caffeine after 2pm- 8 hours before bed.   Habits of sleep- brushing teeth, taking meds- doing the same thing every day in same order, body will understand time to go to sleep.

## 2023-09-24 NOTE — Progress Notes (Signed)
 Pt is a 63 yr old female with hx of asthma, prediabetes; HTN, BMI of 32- down to 31.38; and chronic low back pain with sciatica as well as hx of lumbar fusion- L4/5 had surgery for back pain. Here for evaluation of back pain s/p L4/5 back fusion and new RLE weakness.  Here for f/u on back pain/RLE weakness. And trigger point injections.    Doing well  Tramadol  doing the job most of the time.  At least brings pain down a few notches.  Tries not to take every day.  Takes 2x/day but most of time 1x/day- usually at night.   Helps when she takes it.  Nothing going to kill the pain.   Wants Trp Injections today.   Last time, worked well to have injections- lasted  2 months- on average.  Actually going into 3 moths, because just started bothering her 2-3 weeks ago.   Also going to [employer]-  started chiropractor when had no insurance.   Pain in low back back on R side- moves around,   HEP- up to 4x/week.  Hasn't used theracane lately.  Needs to has a lot of tension in beck and shoulders.     Plan: Patient here for trigger point injections for  Consent done and on chart.  Cleaned areas with alcohol and injected using a 27 gauge 1.5 inch needle  Injected 4.5 cc- wasted 1.5 cc Using 1% Lidocaine  with no EPI  Upper traps- B/L  Levators- B/L  Posterior scalenes Middle scalenes- b/L  Splenius Capitus Pectoralis Major - B/L  Rhomboids- B/L x2 Infraspinatus Teres Major/minor Thoracic paraspinals- B/L x2 Lumbar paraspinals- b/L x2 Other injections-    There was no bleeding or complications.  Patient was advised to drink a lot of water on day after injections to flush system Will have increased soreness for 12-48 hours after injections.  Can use Lidocaine  patches the day AFTER injections Can use theracane on day of injections in places didn't inject Can use heating pad 4-6 hours AFTER injections   2. Con't Tramadol  - last refill - May 19th 2025- doesn't need refills- has  enough at this time- has to pick up tramadol  today.    3. Nex time, focus on legs for Trp Injections  4. F/U in 3 months for f/u on pain and trp Injections  5 Oral drug screen done today.  Per clinic policy  6. Sleep hygiene- turn any electronic device off 1-2 hours before sleep- no caffeine after 2pm- 8 hours before bed.   Habits of sleep- brushing teeth, taking meds- doing the same thing every day in same order, body will understand time to go to sleep.   I spent a total of  28  minutes on total care today- >50% coordination of care- due to 8 minutes on injections- rest d/w pt about sleep and meds- and Drugs creen and reason for doing so

## 2023-09-28 LAB — DRUG TOX MONITOR 1 W/CONF, ORAL FLD

## 2023-09-28 LAB — DRUG TOX ALC METAB W/CON, ORAL FLD: Alcohol Metabolite: NEGATIVE ng/mL (ref <25)

## 2023-11-24 ENCOUNTER — Ambulatory Visit: Admitting: Family Medicine

## 2023-12-09 ENCOUNTER — Ambulatory Visit (INDEPENDENT_AMBULATORY_CARE_PROVIDER_SITE_OTHER): Admitting: Family Medicine

## 2023-12-09 ENCOUNTER — Encounter: Payer: Self-pay | Admitting: Family Medicine

## 2023-12-09 VITALS — BP 124/76 | HR 78 | Temp 97.0°F | Ht 72.0 in | Wt 232.2 lb

## 2023-12-09 DIAGNOSIS — E785 Hyperlipidemia, unspecified: Secondary | ICD-10-CM | POA: Diagnosis not present

## 2023-12-09 DIAGNOSIS — J4540 Moderate persistent asthma, uncomplicated: Secondary | ICD-10-CM

## 2023-12-09 DIAGNOSIS — J301 Allergic rhinitis due to pollen: Secondary | ICD-10-CM | POA: Diagnosis not present

## 2023-12-09 DIAGNOSIS — F5101 Primary insomnia: Secondary | ICD-10-CM

## 2023-12-09 DIAGNOSIS — Z23 Encounter for immunization: Secondary | ICD-10-CM

## 2023-12-09 DIAGNOSIS — I10 Essential (primary) hypertension: Secondary | ICD-10-CM

## 2023-12-09 MED ORDER — ROSUVASTATIN CALCIUM 40 MG PO TABS: 40.0000 mg | ORAL_TABLET | Freq: Every day | ORAL | 3 refills | Status: DC

## 2023-12-09 MED ORDER — TRAZODONE HCL 50 MG PO TABS: 25.0000 mg | ORAL_TABLET | Freq: Every evening | ORAL | 3 refills | Status: DC | PRN

## 2023-12-09 MED ORDER — FLUTICASONE PROPIONATE 50 MCG/ACT NA SUSP: 2.0000 | Freq: Every day | NASAL | 6 refills | Status: AC

## 2023-12-09 MED ORDER — FLUTICASONE FUROATE 200 MCG/ACT IN AEPB: 1.0000 | INHALATION_SPRAY | Freq: Every day | RESPIRATORY_TRACT | 11 refills | Status: AC

## 2023-12-09 MED ORDER — EZETIMIBE 10 MG PO TABS: 10.0000 mg | ORAL_TABLET | Freq: Every day | ORAL | 3 refills | Status: AC

## 2023-12-09 MED ORDER — LEVOCETIRIZINE DIHYDROCHLORIDE 5 MG PO TABS: 5.0000 mg | ORAL_TABLET | Freq: Every day | ORAL | 1 refills | Status: AC | PRN

## 2023-12-09 MED ORDER — MONTELUKAST SODIUM 10 MG PO TABS: 10.0000 mg | ORAL_TABLET | Freq: Every day | ORAL | 3 refills | Status: DC

## 2023-12-09 MED ORDER — ALBUTEROL SULFATE HFA 108 (90 BASE) MCG/ACT IN AERS: 2.0000 | INHALATION_SPRAY | Freq: Four times a day (QID) | RESPIRATORY_TRACT | 6 refills | Status: AC | PRN

## 2023-12-09 NOTE — Assessment & Plan Note (Signed)
Blood pressure is in good control. Continue losartan-HCTZ (Hyzaar) 100-25 mg daily.

## 2023-12-09 NOTE — Progress Notes (Signed)
 [facility] [address] [place] [address] Dept: [phone] Dept Fax: [phone]  Chronic Care Office Visit  Subjective:    Patient ID: Jessica Huff, female    DOB: 04-25-1960, 63 y.o..   MRN: [id number]  Chief Complaint  Patient presents with   Hypertension    3 month f/u HTN.  C/o having HA's/sinus pressure x 2 weeks.   Flu shot today.    History of Present Illness:  Patient is in today for reassessment of chronic medical issues.  Ms. Skerritt has a history of hypertension. She is currently on losartan -HCTZ (Hyzaar) 100-25 mg daily.   Ms. Neglia has a history of hyperlipidemia. She is managed on rosuvastatin  40 mg daily and ezetimibe  10 mg daily.   Ms. Rost has a history of insomnia. She is managed with trazodone  25-50 mg at bedtime as needed.  Ms. Wilshire has a history of moderate persistent asthma. She is on fluticasone  furoate inhaler daily and albuterol  inhaler as needed. She notes her asthma has been in good control.  Ms. Jarnagin has a history of allergic rhinitis. She is managed on levocetirizine 10 mg daily, fluticasone  nasal spray, and montelukast  10 mg daily. She has had an increase in sinus pressure more recently. She has used nasal saline nasal washes in the past, but has not done so recently.  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   Malaise and fatigue 02/07/2021   Prediabetes 10/19/2018   S/P lumbar fusion 09/16/2018   Asthma in adult, moderate persistent, uncomplicated 07/03/2016   Chronic pain of right ankle 03/02/2016   Allergic rhinitis 11/29/2015   Class 1 obesity due to excess calories with serious comorbidity and body mass index (BMI) of  32.0 to 32.9 in adult 11/29/2015   Benign positional vertigo 04/07/2015   Chronic bilateral low back pain with sciatica 04/07/2015   Common migraine 04/07/2015   Hyperlipidemia 04/07/2015   Essential hypertension 04/07/2015   Insomnia 04/07/2015   Obstructive sleep apnea 04/07/2015   Lumbar radiculopathy 04/07/2015   Anxiety 04/07/2015   Past Surgical History:  Procedure Laterality Date   ANKLE SURGERY Right 2018   LUMBAR FUSION  2020   L4-L5   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   Procedure: RIGHT TOTAL KNEE ARTHROPLASTY;  Surgeon: Harden Jerona GAILS, MD;  Location: [facility] OR;  Service: Orthopedics;  Laterality: Right;   Family History  Problem Relation Age of Onset   Diabetes Mother    Kidney disease Mother    Cancer Father        Kidney   Outpatient Medications Prior to Visit  Medication Sig Dispense Refill   ALPRAZolam  (XANAX ) 0.5 MG tablet Take 1 tablet (0.5 mg total) by mouth daily as needed for flying 2 tablet 0   losartan -hydrochlorothiazide  (HYZAAR) 100-25 MG tablet TAKE 1 TABLET BY MOUTH DAILY 90 tablet 3   meclizine  (ANTIVERT ) 25 MG tablet Take 1 tablet (25 mg total) by mouth 3 (three) times daily as needed for dizziness. 30 tablet 0   Multiple Vitamin (MULTI-VITAMIN) tablet Take 1 tablet by mouth daily.     potassium chloride  (KLOR-CON  M) 10 MEQ tablet Take 1 tablet (10 mEq total) by mouth 2 (two) times daily. 60 tablet 11   traMADol  (  ULTRAM ) 50 MG tablet Take 1 tablet (50 mg total) by mouth 2 (two) times daily. For back pain 60 tablet 5   albuterol  (VENTOLIN  HFA) 108 (90 Base) MCG/ACT inhaler INHALE 2 PUFFS INTO THE LUNGS EVERY 6 HOURS AS NEEDED 6.7 g 6   ezetimibe  (ZETIA ) 10 MG tablet TAKE 1 TABLET(10 MG) BY MOUTH DAILY 90 tablet 1   fluticasone  (FLONASE ) 50 MCG/ACT nasal spray Place 2 sprays into both nostrils daily. 16 g 6   Fluticasone  Furoate 200 MCG/ACT AEPB Inhale 1 Inhalation into the lungs daily. 30 each 11   levocetirizine (XYZAL ) 5 MG tablet Take 1 tablet (5  mg total) by mouth daily as needed for allergies. 90 tablet 1   montelukast  (SINGULAIR ) 10 MG tablet TAKE 1 TABLET(10 MG) BY MOUTH AT BEDTIME 90 tablet 1   rosuvastatin  (CRESTOR ) 40 MG tablet TAKE 1 TABLET(40 MG) BY MOUTH DAILY 90 tablet 1   traZODone  (DESYREL ) 50 MG tablet Take 0.5-1 tablets (25-50 mg total) by mouth at bedtime as needed for sleep. 30 tablet 3   No facility-administered medications prior to visit.   No Known Allergies Objective:   Today's Vitals   12/09/23 1051  BP: 124/76  Pulse: 78  Temp: (!) 97 F (36.1 C)  TempSrc: Temporal  SpO2: 98%  Weight: 232 lb 3.2 oz (105.3 kg)  Height: 6' (1.829 m)   Body mass index is 31.49 kg/m.   General: Well developed, well nourished. No acute distress. Psych: Alert and oriented. Normal mood and affect.  Health Maintenance Due  Topic Date Due   Medicare Annual Wellness (AWV)  Never done   Influenza Vaccine  09/24/2023     Assessment & Plan:   Problem List Items Addressed This Visit       Cardiovascular and Mediastinum   Essential hypertension - Primary   Blood pressure is in good control. Continue losartan -HCTZ (Hyzaar) 100-25 mg daily.      Relevant Medications   ezetimibe  (ZETIA ) 10 MG tablet   rosuvastatin  (CRESTOR ) 40 MG tablet     Respiratory   Allergic rhinitis   I will renew her fluticasone  nasal spray, levocetirizine 10 mg daily and montelukast  10 mg daily. I recommend she renew nasal saline flushes.      Relevant Medications   fluticasone  (FLONASE ) 50 MCG/ACT nasal spray   montelukast  (SINGULAIR ) 10 MG tablet   levocetirizine (XYZAL ) 5 MG tablet   Asthma in adult, moderate persistent, uncomplicated   Stable. Continue fluticasone  furoate AEPB daily and albuterol  as needed.      Relevant Medications   albuterol  (VENTOLIN  HFA) 108 (90 Base) MCG/ACT inhaler   Fluticasone  Furoate 200 MCG/ACT AEPB   montelukast  (SINGULAIR ) 10 MG tablet     Other   Hyperlipidemia   LDL cholesterol is at goal.  Continue rosuvastatin  40 mg daily and ezetimibe  10 mg daily.      Relevant Medications   ezetimibe  (ZETIA ) 10 MG tablet   rosuvastatin  (CRESTOR ) 40 MG tablet   Insomnia   Stable. Continue trazodone  25-50 mg at bedtime as needed.      Relevant Medications   traZODone  (DESYREL ) 50 MG tablet   Other Visit Diagnoses       Need for immunization against influenza       Relevant Orders   Flu vaccine trivalent PF, 6mos and older(Flulaval,Afluria,Fluarix,Fluzone) (Completed)       Return in about 3 months (around 03/10/2024) for Reassessment.   Garnette CHRISTELLA Simpler, MD

## 2023-12-09 NOTE — Assessment & Plan Note (Signed)
 LDL cholesterol is at goal. Continue rosuvastatin  40 mg daily and ezetimibe  10 mg daily.

## 2023-12-09 NOTE — Assessment & Plan Note (Signed)
 Stable. Continue trazodone 25-50 mg at bedtime as needed.

## 2023-12-09 NOTE — Assessment & Plan Note (Signed)
 Stable. Continue fluticasone furoate AEPB daily and albuterol as needed.

## 2023-12-09 NOTE — Assessment & Plan Note (Addendum)
 I will renew her fluticasone  nasal spray, levocetirizine 10 mg daily and montelukast  10 mg daily. I recommend she renew nasal saline flushes.

## 2023-12-17 DIAGNOSIS — H524 Presbyopia: Secondary | ICD-10-CM | POA: Diagnosis not present

## 2023-12-17 DIAGNOSIS — H5213 Myopia, bilateral: Secondary | ICD-10-CM | POA: Diagnosis not present

## 2023-12-17 DIAGNOSIS — H52209 Unspecified astigmatism, unspecified eye: Secondary | ICD-10-CM | POA: Diagnosis not present

## 2023-12-24 ENCOUNTER — Encounter: Attending: Physical Medicine and Rehabilitation | Admitting: Physical Medicine and Rehabilitation

## 2023-12-24 ENCOUNTER — Encounter: Payer: Self-pay | Admitting: Physical Medicine and Rehabilitation

## 2023-12-24 VITALS — BP 148/82 | HR 72 | Ht 72.0 in | Wt 232.0 lb

## 2023-12-24 DIAGNOSIS — G8929 Other chronic pain: Secondary | ICD-10-CM | POA: Diagnosis not present

## 2023-12-24 DIAGNOSIS — M5441 Lumbago with sciatica, right side: Secondary | ICD-10-CM | POA: Diagnosis not present

## 2023-12-24 DIAGNOSIS — F5101 Primary insomnia: Secondary | ICD-10-CM | POA: Insufficient documentation

## 2023-12-24 DIAGNOSIS — Z981 Arthrodesis status: Secondary | ICD-10-CM | POA: Diagnosis not present

## 2023-12-24 DIAGNOSIS — M7918 Myalgia, other site: Secondary | ICD-10-CM | POA: Insufficient documentation

## 2023-12-24 MED ORDER — TRAMADOL HCL 50 MG PO TABS: 50.0000 mg | ORAL_TABLET | Freq: Two times a day (BID) | ORAL | 5 refills | Status: AC

## 2023-12-24 NOTE — Progress Notes (Signed)
 Subjective:    Patient ID: Jessica Huff, female    DOB: 1960-12-27, 63 y.o.   MRN: [id number]  HPI  Pt is a 63 yr old female with hx of asthma, prediabetes; HTN, BMI of 32- down to 31.38; and chronic low back pain with sciatica as well as hx of lumbar fusion- L4/5 had surgery for back pain. Here for evaluation of back pain s/p L4/5 back fusion and new RLE weakness.  Here for f/u on back pain/RLE weakness.    Still taking Tramadol  1 tab BID.   Needs the the trigger point injections, but plans on going to gym after appt.   Sleep-  still about the same.  Got some sleeping pills from PCP- trazodone  50 mg at bedtime  Takes them-  took meds at 8pm and didn't go to sleep til 1230 -1 am.   Didn't have TV on- usually doesn't turn TV on in bed.  Using phone.   Pain Inventory Average Pain 4 Pain Right Now 4 My pain is constant, sharp, and aching  In the last 24 hours, has pain interfered with the following? General activity 5 Relation with others 0 Enjoyment of life 0 What TIME of day is your pain at its worst? daytime Sleep (in general) Fair  Pain is worse with: walking, bending, inactivity, standing, and some activites Pain improves with: rest and medication Relief from Meds: 5  Family History  Problem Relation Age of Onset   Diabetes Mother    Kidney disease Mother    Cancer Father        Kidney   Social History   Socioeconomic History   Marital status: Divorced    Spouse name: Not on file   Number of children: 2   Years of education: Not on file   Highest education level: 11th grade  Occupational History   Not on file  Tobacco Use   Smoking status: Never   Smokeless tobacco: Never  Vaping Use   Vaping status: Never Used  Substance and Sexual Activity   Alcohol use: Yes    Comment: socially   Drug use: Never   Sexual activity: Yes  Other Topics Concern   Not on file  Social History Narrative   Not on file   Social Drivers of Health   Financial Resource  Strain: Medium Risk (12/08/2023)   Overall Financial Resource Strain (CARDIA)    Difficulty of Paying Living Expenses: Somewhat hard  Food Insecurity: No Food Insecurity (12/08/2023)   Hunger Vital Sign    Worried About Running Out of Food in the Last Year: Never true    Ran Out of Food in the Last Year: Never true  Transportation Needs: No Transportation Needs (12/08/2023)   PRAPARE - Administrator, Civil Service (Medical): No    Lack of Transportation (Non-Medical): No  Physical Activity: Insufficiently Active (12/08/2023)   Exercise Vital Sign    Days of Exercise per Week: 2 days    Minutes of Exercise per Session: 20 min  Stress: Stress Concern Present (12/08/2023)   [employer] of Occupational Health - Occupational Stress Questionnaire    Feeling of Stress: To some extent  Social Connections: Socially Isolated (12/08/2023)   Social Connection and Isolation Panel    Frequency of Communication with Friends and Family: Once a week    Frequency of Social Gatherings with Friends and Family: Once a week    Attends Religious Services: 1 to 4 times per year  Active Member of Clubs or Organizations: No    Attends Engineer, Structural: Not on file    Marital Status: Divorced   Past Surgical History:  Procedure Laterality Date   ANKLE SURGERY Right 2018   LUMBAR FUSION  2020   L4-L5   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   Procedure: RIGHT TOTAL KNEE ARTHROPLASTY;  Surgeon: Harden Jerona GAILS, MD;  Location: [facility] OR;  Service: Orthopedics;  Laterality: Right;   Past Surgical History:  Procedure Laterality Date   ANKLE SURGERY Right 2018   LUMBAR FUSION  2020   L4-L5   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   Procedure: RIGHT TOTAL KNEE ARTHROPLASTY;  Surgeon: Harden Jerona GAILS, MD;  Location[facility] OR;  Service: Orthopedics;  Laterality: Right;   Past Medical History:  Diagnosis Date   Asthma    GERD (gastroesophageal reflux disease)    Hyperlipidemia     Hypertension    BP (!) 148/82   Pulse 72   Ht 6' (1.829 m)   Wt 232 lb (105.2 kg)   SpO2 97%   BMI 31.46 kg/m   Opioid Risk Score:   Fall Risk Score:  `1  Depression screen [facility] 2/9     12/09/2023   11:07 AM 09/24/2023   11:24 AM 06/16/2023   11:34 AM 05/26/2023   10:51 AM 01/06/2023    9:48 AM 11/24/2022   10:52 AM 10/07/2022    9:12 AM  Depression screen PHQ 2/9  Decreased Interest 1 0 1 1 0 0 0  Down, Depressed, Hopeless 0 0 1 1 0 0 0  PHQ - 2 Score 1 0 2 2 0 0 0  Altered sleeping 1   3  3    Tired, decreased energy 3   3  3    Change in appetite 0   0  3   Feeling bad or failure about yourself  0   0  0   Trouble concentrating 0   1  0   Moving slowly or fidgety/restless 0   0  0   Suicidal thoughts 0   0  0   PHQ-9 Score 5   9  9    Difficult doing work/chores Not difficult at all   Somewhat difficult  Somewhat difficult      Review of Systems  Musculoskeletal:  Positive for back pain and neck pain.       Shoulder pain  All other systems reviewed and are negative.      Objective:   Physical Exam  Awake, alert, appropriate, using single point cane, NAD Ttp across low back       Assessment & Plan:   Pt is a 63 yr old female with hx of asthma, prediabetes; HTN, BMI of 32- down to 31.38; and chronic low back pain with sciatica as well as hx of lumbar fusion- L4/5 had surgery for back pain. Here for evaluation of back pain s/p L4/5 back fusion and new RLE weakness.  Here for f/u on back pain/RLE weakness.   Will wait on doing trigger point injections today- per pt request. Will do next visit- focus on legs as well   2.  Suggest  stopping using phone 1-2 hours before goes to bed/wants to sleep.  Trazodone  usually works at 30-60 minutes- after you take it- and needs 8-9 hours after takes it to not be groggy in AM Says she's a night owl  3. Suggest no caffeine no later than 2pm- prefer noon. It  lasts 7-12 hours-  sleep hygiene- need to do. We can give you all the  meds in the world and won't work if you don't do sleep hygiene!   4. Pain is worse when you don't sleep.  Because Deep stage 4 sleep is where muscles and bones, etc heal some.    5. Con't Tramadol - per clinic policy-  50 mg 2x/day as needed- needs refills  6. Last Drug screen last visit- 09/24/23-  so not due today.    7. F/U in 3 months for trp injections and f/u on chronic pain.    I spent a total of 21   minutes on total care today- >50% coordination of care- due to d/w pt about pain and sleep hygiene-  and how to manage Sleep and pain.

## 2023-12-24 NOTE — Patient Instructions (Signed)
 Pt is a 63 yr old female with hx of asthma, prediabetes; HTN, BMI of 32- down to 31.38; and chronic low back pain with sciatica as well as hx of lumbar fusion- L4/5 had surgery for back pain. Here for evaluation of back pain s/p L4/5 back fusion and new RLE weakness.  Here for f/u on back pain/RLE weakness.   Will wait on doing trigger point injections today- per pt request. Will do next visit- focus on legs as well   2.  Suggest  stopping using phone 1-2 hours before goes to bed/wants to sleep.  Trazodone  usually works at 30-60 minutes- after you take it- and needs 8-9 hours after takes it to not be groggy in AM Says she's a night owl  3. Suggest no caffeine no later than 2pm- prefer noon. It lasts 7-12 hours-  sleep hygiene- need to do. We can give you all the meds in the world and won't work if you don't do sleep hygiene!   4. Pain is worse when you don't sleep.  Because Deep stage 4 sleep is where muscles and bones, etc heal some.    5. Con't Tramadol - per clinic policy-  50 mg 2x/day as needed- needs refills  6. Last Drug screen last visit- 09/24/23-  so not due today.    7. F/U in 3 months for trp injections and f/u on chronic pain.

## 2023-12-27 ENCOUNTER — Ambulatory Visit: Admitting: Family Medicine

## 2023-12-30 DIAGNOSIS — Z0279 Encounter for issue of other medical certificate: Secondary | ICD-10-CM

## 2024-01-09 ENCOUNTER — Other Ambulatory Visit: Payer: Self-pay | Admitting: Family Medicine

## 2024-01-09 DIAGNOSIS — F5101 Primary insomnia: Secondary | ICD-10-CM

## 2024-02-01 ENCOUNTER — Other Ambulatory Visit: Payer: Self-pay | Admitting: Family Medicine

## 2024-02-01 DIAGNOSIS — E785 Hyperlipidemia, unspecified: Secondary | ICD-10-CM

## 2024-02-09 ENCOUNTER — Encounter: Payer: Self-pay | Admitting: Family Medicine

## 2024-02-09 DIAGNOSIS — G4733 Obstructive sleep apnea (adult) (pediatric): Secondary | ICD-10-CM | POA: Diagnosis not present

## 2024-02-14 ENCOUNTER — Ambulatory Visit

## 2024-02-14 ENCOUNTER — Ambulatory Visit: Admitting: Podiatry

## 2024-02-14 DIAGNOSIS — M21611 Bunion of right foot: Secondary | ICD-10-CM | POA: Diagnosis not present

## 2024-02-14 DIAGNOSIS — M21619 Bunion of unspecified foot: Secondary | ICD-10-CM

## 2024-02-14 NOTE — Progress Notes (Signed)
" ° °  Chief Complaint  Patient presents with   Bunions    R foot bunion pain has steadily become worse since last Appt. Pain in 1st and 2nd toe with some tingling and numbness.  Not diabetic. No anti coag    Subjective: 63 y.o. female presents today for follow-up evaluation of symptomatic bunions to the bilateral feet.  Patient states that she has had bunions for several years.  Since last visit she states that the bunions have become more severe and symptomatic.  Past Medical History:  Diagnosis Date   Asthma    GERD (gastroesophageal reflux disease)    Hyperlipidemia    Hypertension     Past Surgical History:  Procedure Laterality Date   ANKLE SURGERY Right 2018   LUMBAR FUSION  2020   L4-L5   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   Procedure: RIGHT TOTAL KNEE ARTHROPLASTY;  Surgeon: Harden Jerona GAILS, MD;  Location: [facility] OR;  Service: Orthopedics;  Laterality: Right;    No Known Allergies   Objective: Physical Exam General: The patient is alert and oriented x3 in no acute distress.  Dermatology: Skin is cool, dry and supple bilateral lower extremities. Negative for open lesions or macerations.  Vascular: Palpable pedal pulses bilaterally. No edema or erythema noted. Capillary refill within normal limits.  Neurological: Grossly intact via light touch  Musculoskeletal Exam: Clinical evidence of bunion deformity noted to the respective foot. There is moderate pain on palpation range of motion of the first MPJ. Lateral deviation of the hallux noted consistent with hallux abductovalgus.  Radiographic Exam RT foot 02/14/2024: Normal osseous mineralization.  No acute fractures identified.  Increased intermetatarsal angle greater than 15 with a hallux abductus angle greater than 30 noted on AP view.  Increased metatarsus adductus angle also noted with medial deviation of the metatarsals specifically the second and third  Assessment: 1.  Hallux valgus bilateral 2.  Metatarsus  adductus bilateral  -Patient evaluated.  X-rays reviewed -Today we discussed both conservative and surgical management of the patient's bunions.  Last visit we did discuss correction of the metatarsus adductus but the patient's pain is isolated just to the bunion.  To prevent a more invasive and complicated procedure addressing the metatarsus adductus I do believe that she would get some relief from simple correction of the bunion deformity.  Today we discussed the risk benefits advantages and disadvantages as well as the postoperative recovery course regarding the bunion deformity.  All patient questions were answered.  No guarantees were expressed or implied.  Patient consents for surgery -Authorization for surgery was initiated today.  Surgery will consist of bunionectomy with osteotomy of the right foot -Return to clinic 1 week postop   Thresa EMERSON Sar, DPM [facility]  Dr. Thresa EMERSON Sar, DPM    [address] [place], [place] [address]                Office [phone]  Fax [phone]      "

## 2024-03-10 ENCOUNTER — Ambulatory Visit (INDEPENDENT_AMBULATORY_CARE_PROVIDER_SITE_OTHER): Admitting: Family Medicine

## 2024-03-10 ENCOUNTER — Encounter: Payer: Self-pay | Admitting: Family Medicine

## 2024-03-10 VITALS — BP 130/68 | HR 79 | Temp 97.3°F | Ht 72.0 in | Wt 229.6 lb

## 2024-03-10 DIAGNOSIS — J4540 Moderate persistent asthma, uncomplicated: Secondary | ICD-10-CM | POA: Diagnosis not present

## 2024-03-10 DIAGNOSIS — E785 Hyperlipidemia, unspecified: Secondary | ICD-10-CM

## 2024-03-10 DIAGNOSIS — I10 Essential (primary) hypertension: Secondary | ICD-10-CM | POA: Diagnosis not present

## 2024-03-10 NOTE — Assessment & Plan Note (Signed)
 LDL cholesterol is at goal. Continue rosuvastatin  40 mg daily and ezetimibe  10 mg daily.

## 2024-03-10 NOTE — Progress Notes (Signed)
 " [facility] [address] [place] [address] Dept: [phone] Dept Fax: [phone]  Chronic Care Office Visit  Subjective:    Patient ID: Jessica Huff, female    DOB: 1960/08/29, 64 y.o..   MRN: [id number]  Chief Complaint  Patient presents with   Hypertension    3 month f/u HTN.  No concerns.    History of Present Illness:  Patient is in today for reassessment of chronic medical conditions.   Jessica Huff has a history of hypertension. She is currently on losartan -HCTZ (Hyzaar) 100-25 mg daily.   Jessica Huff has a history of hyperlipidemia. She is managed on rosuvastatin  40 mg daily and ezetimibe  10 mg daily.   Jessica Huff has a history of moderate persistent asthma. She is on fluticasone  furoate inhaler daily and albuterol  inhaler as needed. She notes her asthma has been in good control.   Jessica Huff has a history of a prior right total knee joint replacement in May 2023. She continues to have some weakness in the right leg and uses a cane to assist with ambulation.  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   Malaise and fatigue 02/07/2021   Prediabetes 10/19/2018   S/P lumbar fusion 09/16/2018   Asthma in adult, moderate persistent, uncomplicated 07/03/2016   Chronic pain of right ankle 03/02/2016   Allergic rhinitis 11/29/2015   Class 1 obesity due to excess calories with serious comorbidity and body mass index (BMI) of 32.0 to 32.9 in adult 11/29/2015   Benign positional vertigo 04/07/2015   Chronic bilateral low back pain with sciatica 04/07/2015   Common migraine 04/07/2015   Hyperlipidemia 04/07/2015   Essential hypertension 04/07/2015   Insomnia 04/07/2015    Obstructive sleep apnea 04/07/2015   Lumbar radiculopathy 04/07/2015   Anxiety 04/07/2015   Past Surgical History:  Procedure Laterality Date   ANKLE SURGERY Right 2018   LUMBAR FUSION  2020   L4-L5   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   Procedure: RIGHT TOTAL KNEE ARTHROPLASTY;  Surgeon: Harden Jerona GAILS, MD;  Location: [facility] OR;  Service: Orthopedics;  Laterality: Right;   Family History  Problem Relation Age of Onset   Diabetes Mother    Kidney disease Mother    Cancer Father        Kidney   Outpatient Medications Prior to Visit  Medication Sig Dispense Refill   albuterol  (VENTOLIN  HFA) 108 (90 Base) MCG/ACT inhaler Inhale 2 puffs into the lungs every 6 (six) hours as needed. 6.7 g 6   ALPRAZolam  (XANAX ) 0.5 MG tablet Take 1 tablet (0.5 mg total) by mouth daily as needed for flying 2 tablet 0   ezetimibe  (ZETIA ) 10 MG tablet Take 1 tablet (10 mg total) by mouth daily. 90 tablet 3   fluticasone  (FLONASE ) 50 MCG/ACT nasal spray Place 2 sprays into both nostrils daily. 16 g 6   Fluticasone  Furoate 200 MCG/ACT AEPB Inhale 1 Inhalation into the lungs daily. 30 each 11   levocetirizine (XYZAL ) 5 MG tablet Take 1 tablet (5 mg total) by mouth daily as needed for allergies. 90 tablet 1   losartan -hydrochlorothiazide  (HYZAAR) 100-25 MG tablet TAKE 1 TABLET BY MOUTH DAILY 90 tablet 3   meclizine  (ANTIVERT ) 25 MG tablet  Take 1 tablet (25 mg total) by mouth 3 (three) times daily as needed for dizziness. 30 tablet 0   montelukast  (SINGULAIR ) 10 MG tablet Take 1 tablet (10 mg total) by mouth at bedtime. 90 tablet 3   Multiple Vitamin (MULTI-VITAMIN) tablet Take 1 tablet by mouth daily.     potassium chloride  (KLOR-CON  M) 10 MEQ tablet Take 1 tablet (10 mEq total) by mouth 2 (two) times daily. 60 tablet 11   rosuvastatin  (CRESTOR ) 40 MG tablet TAKE 1 TABLET(40 MG) BY MOUTH DAILY 90 tablet 3   traMADol  (ULTRAM ) 50 MG tablet Take 1 tablet (50 mg total) by mouth 2 (two) times daily. For back pain  60 tablet 5   traZODone  (DESYREL ) 50 MG tablet TAKE 1/2 TO 1 TABLET(25 TO 50 MG) BY MOUTH AT BEDTIME AS NEEDED FOR SLEEP 30 tablet 3   No facility-administered medications prior to visit.   Allergies[1]   Objective:   Today's Vitals   03/10/24 0951  BP: 130/68  Pulse: 79  Temp: (!) 97.3 F (36.3 C)  TempSrc: Temporal  SpO2: 96%  Weight: 229 lb 9.6 oz (104.1 kg)  Height: 6' (1.829 m)   Body mass index is 31.14 kg/m.   General: Well developed, well nourished. No acute distress. Lungs: Clear to auscultation bilaterally. No wheezing, rales or rhonchi. Psych: Alert and oriented. Normal mood and affect.  Health Maintenance Due  Topic Date Due   Medicare Annual Wellness (AWV)  Never done   COVID-19 Vaccine (4 - 2025-26 season) 10/25/2023     Assessment & Plan:   Problem List Items Addressed This Visit       Cardiovascular and Mediastinum   Essential hypertension   Blood pressure is in good control. Continue losartan -HCTZ (Hyzaar) 100-25 mg daily.        Respiratory   Asthma in adult, moderate persistent, uncomplicated   Stable. Continue fluticasone  furoate AEPB daily and albuterol  as needed.        Other   Hyperlipidemia - Primary   LDL cholesterol is at goal. Continue rosuvastatin  40 mg daily and ezetimibe  10 mg daily.       Return in about 3 months (around 06/08/2024).   Garnette CHRISTELLA Simpler, MD  I,Emily Lagle,acting as a scribe for Garnette CHRISTELLA Simpler, MD.,have documented all relevant documentation on the behalf of Garnette CHRISTELLA Simpler, MD.  I, Garnette CHRISTELLA Simpler, MD, have reviewed all documentation for this visit. The documentation on 03/10/2024 for the exam, diagnosis, procedures, and orders are all accurate and complete.     [1] No Known Allergies  "

## 2024-03-10 NOTE — Assessment & Plan Note (Signed)
 Continue fluticasone  nasal spray, levocetirizine 10 mg daily and montelukast  10 mg daily.

## 2024-03-10 NOTE — Assessment & Plan Note (Signed)
 Stable. Continue trazodone 25-50 mg at bedtime as needed.

## 2024-03-10 NOTE — Assessment & Plan Note (Signed)
 Stable. Continue fluticasone furoate AEPB daily and albuterol as needed.

## 2024-03-10 NOTE — Assessment & Plan Note (Signed)
Blood pressure is in good control. Continue losartan-HCTZ (Hyzaar) 100-25 mg daily.

## 2024-03-20 ENCOUNTER — Other Ambulatory Visit: Payer: Self-pay | Admitting: Family Medicine

## 2024-03-20 DIAGNOSIS — J4540 Moderate persistent asthma, uncomplicated: Secondary | ICD-10-CM

## 2024-03-20 DIAGNOSIS — J301 Allergic rhinitis due to pollen: Secondary | ICD-10-CM

## 2024-03-21 ENCOUNTER — Telehealth: Payer: Self-pay | Admitting: Podiatry

## 2024-03-21 NOTE — Telephone Encounter (Signed)
 Called and patient is scheduled for 04/13/2024. Patient not on any GLP1 or blood thinners, patient pharmacy correct in chart.

## 2024-03-22 ENCOUNTER — Telehealth: Payer: Self-pay | Admitting: Podiatry

## 2024-03-22 NOTE — Telephone Encounter (Signed)
 DOS- 04/13/2024  [place] BUNIONECTOMY RT- 71703 2ND HAMMERTOE REPAIR RT- 71714  [employer] EFFECTIVE DATE- 07/25/2023  DEDUCTIBLE- $0 REMAINING- $0 OOP- $5900 REMAINING- $5900 COINSURANCE- 0%  PER AVAILITY PORTAL, PRIOR AUTH IS NOT REQUIRED FOR CPT CODES 71703 AND [phone].

## 2024-03-24 ENCOUNTER — Encounter: Admitting: Physical Medicine and Rehabilitation

## 2024-03-31 ENCOUNTER — Encounter: Admitting: Physical Medicine and Rehabilitation

## 2024-03-31 ENCOUNTER — Encounter: Payer: Self-pay | Admitting: Physical Medicine and Rehabilitation

## 2024-03-31 VITALS — BP 130/82 | HR 84 | Ht 72.0 in | Wt 230.4 lb

## 2024-03-31 DIAGNOSIS — M7918 Myalgia, other site: Secondary | ICD-10-CM

## 2024-03-31 DIAGNOSIS — H8110 Benign paroxysmal vertigo, unspecified ear: Secondary | ICD-10-CM

## 2024-03-31 DIAGNOSIS — G894 Chronic pain syndrome: Secondary | ICD-10-CM | POA: Insufficient documentation

## 2024-03-31 DIAGNOSIS — M5416 Radiculopathy, lumbar region: Secondary | ICD-10-CM

## 2024-03-31 DIAGNOSIS — G43009 Migraine without aura, not intractable, without status migrainosus: Secondary | ICD-10-CM

## 2024-03-31 MED ORDER — SUMATRIPTAN SUCCINATE 100 MG PO TABS: 100.0000 mg | ORAL_TABLET | ORAL | 5 refills | Status: AC | PRN

## 2024-03-31 NOTE — Progress Notes (Signed)
 "  Subjective:    Patient ID: Jessica Huff, female    DOB: [birth year], 64 y.o.   MRN: [id number]  HPI  Pt is a 64 yr old female with hx of asthma, prediabetes; HTN, BMI of 32- down to 31.38; and chronic low back pain with sciatica as well as hx of lumbar fusion- L4/5 had surgery for back pain. Here for evaluation of back pain s/p L4/5 back fusion and new RLE weakness.  Here for f/u on back pain/RLE weakness.   Feeling real hot, like would pass out  Lowered pt into the laying position on bed and put her feet up    Wants trP injections Hurting a lot.    Says drinking well-  5-6 glasses/water/day.  Of 12-16 oz/day.    Drinks a LOT during the day- also even at night.   Hasn't been feeling sick lately.    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  Last one lasted 1 month- Meclizine  used to help, but not last time.   Pain been so-so- 4/10- Where it normally runs Once time ran out of pills,  could tell a difference when ran out of Tramadol - but got a lot worse, when doesn't take it.   Last drug screen 09/2023.   Has had HA lately- last HA was now-  Doesn't usually take meds for it- but thinks might be stressed.  Is light and sound sensitive when has HA- and some associated nausea- occ- but last time could have been the milk.   Gets 3-4 migraines/month So bad can vomit sometimes- no aura.   Hasn't been given meds for it in a long time- last medicine ordered insurance wouldn't cover- at [facility].   Also reports brain feeling a little foggy as well.    Pain Inventory Average Pain 4 Pain Right Now 4 My pain is constant  In the last 24 hours, has pain interfered with the following? General activity 4 Relation with others 4 Enjoyment of life N/A What TIME of day is your pain at its worst? morning  and evening Sleep (in general) Fair  Pain is worse with: walking, bending, sitting, inactivity, standing, and some activites Pain improves with: rest,  therapy/exercise, medication, TENS, and injections Relief from Meds: 4  Family History  Problem Relation Age of Onset   Diabetes Mother    Kidney disease Mother    Cancer Father        Kidney   Social History   Socioeconomic History   Marital status: Divorced    Spouse name: Not on file   Number of children: 2   Years of education: Not on file   Highest education level: 11th grade  Occupational History   Not on file  Tobacco Use   Smoking status: Never   Smokeless tobacco: Never  Vaping Use   Vaping status: Never Used  Substance and Sexual Activity   Alcohol use: Yes    Comment: socially   Drug use: Never   Sexual activity: Yes  Other Topics Concern   Not on file  Social History Narrative   Not on file   Social Drivers of Health   Tobacco Use: Low Risk (03/31/2024)   Patient History    Smoking Tobacco Use: Never    Smokeless Tobacco Use: Never    Passive Exposure: Not on file  Financial Resource Strain: Medium Risk (12/08/2023)   Overall Financial Resource Strain (CARDIA)    Difficulty of Paying Living  Expenses: Somewhat hard  Food Insecurity: No Food Insecurity (12/08/2023)   Epic    Worried About Programme Researcher, Broadcasting/film/video in the Last Year: Never true    Ran Out of Food in the Last Year: Never true  Transportation Needs: No Transportation Needs (12/08/2023)   Epic    Lack of Transportation (Medical): No    Lack of Transportation (Non-Medical): No  Physical Activity: Insufficiently Active (12/08/2023)   Exercise Vital Sign    Days of Exercise per Week: 2 days    Minutes of Exercise per Session: 20 min  Stress: Stress Concern Present (12/08/2023)   [employer] of Occupational Health - Occupational Stress Questionnaire    Feeling of Stress: To some extent  Social Connections: Socially Isolated (12/08/2023)   Social Connection and Isolation Panel    Frequency of Communication with Friends and Family: Once a week    Frequency of Social Gatherings with Friends  and Family: Once a week    Attends Religious Services: 1 to 4 times per year    Active Member of Clubs or Organizations: No    Attends Engineer, Structural: Not on file    Marital Status: Divorced  Depression (PHQ2-9): Low Risk (03/10/2024)   Depression (PHQ2-9)    PHQ-2 Score: 0  Alcohol Screen: Low Risk (12/08/2023)   Alcohol Screen    Last Alcohol Screening Score (AUDIT): 1  Housing: High Risk (12/08/2023)   Epic    Unable to Pay for Housing in the Last Year: Yes    Number of Times Moved in the Last Year: 0    Homeless in the Last Year: No  Utilities: Not on file  Health Literacy: Not on file   Past Surgical History:  Procedure Laterality Date   ANKLE SURGERY Right 2018   LUMBAR FUSION  2020   L4-L5   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   Procedure: RIGHT TOTAL KNEE ARTHROPLASTY;  Surgeon: Harden Jerona GAILS, MD;  Location: [facility] OR;  Service: Orthopedics;  Laterality: Right;   Past Surgical History:  Procedure Laterality Date   ANKLE SURGERY Right 2018   LUMBAR FUSION  2020   L4-L5   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   Procedure: RIGHT TOTAL KNEE ARTHROPLASTY;  Surgeon: Harden Jerona GAILS, MD;  Location: [facility] OR;  Service: Orthopedics;  Laterality: Right;   Past Medical History:  Diagnosis Date   Asthma    GERD (gastroesophageal reflux disease)    Hyperlipidemia    Hypertension    BP 138/80 (BP Location: Left Arm, Patient Position: Sitting, Cuff Size: Large)   Pulse 92   Ht 6' (1.829 m)   Wt 230 lb 6.4 oz (104.5 kg)   SpO2 97%   BMI 31.25 kg/m   Opioid Risk Score:   Fall Risk Score:  `1  Depression screen [facility] 2/9     03/10/2024    9:51 AM 12/09/2023   11:07 AM 09/24/2023   11:24 AM 06/16/2023   11:34 AM 05/26/2023   10:51 AM 01/06/2023    9:48 AM 11/24/2022   10:52 AM  Depression screen PHQ 2/9  Decreased Interest 0 1 0 1 1 0 0  Down, Depressed, Hopeless 0 0 0 1 1 0 0  PHQ - 2 Score 0 1 0 2 2 0 0  Altered sleeping  1   3  3   Tired, decreased energy   3   3  3   Change in appetite  0   0  3  Feeling  bad or failure about yourself   0   0  0  Trouble concentrating  0   1  0  Moving slowly or fidgety/restless  0   0  0  Suicidal thoughts  0   0  0  PHQ-9 Score  5    9   9    Difficult doing work/chores  Not difficult at all   Somewhat difficult  Somewhat difficult     Data saved with a previous flowsheet row definition      Review of Systems  Musculoskeletal:  Positive for arthralgias and back pain.       Bilateral shoulder, hip, thigh pain, low back pain  All other systems reviewed and are negative.      Objective:   Physical Exam  Awake, alert, appropriate, but looked like like felt really badly when walked in door- pale- like would pass out- BP was 138/80- and HR 92,  Not acute distress, but obviously feels bad C/o syncopal Sx's- Now feels like cannot breathe like normal-  had pt lay down and put up her feet-  RRR- heart rate- is in 90's- at rest- even when listened again- laying down  CTA B/L  Abd soft, NT, ND, hypoactive BS No LE edema B/L Had her lay back down after 5 minutes down and 10 minutes up because getting pale again-       Assessment & Plan:   Pt is a 64 yr old female with hx of asthma, prediabetes; HTN, BMI of 32- down to 31.38; and chronic low back pain with sciatica as well as hx of lumbar fusion- L4/5 had surgery for back pain. Here for evaluation of back pain s/p L4/5 back fusion and new RLE weakness.  Here for f/u on back pain/RLE weakness.    Needs trp injections, however pt looked/felt like was going to pass out- so will reschedule her until next week for Trp Injections- and wait til then, so could make her feel worse.   2. For migraines-  Will try Imitrex /Sumitriptan- 100 mg take within, if possible, the first 15 minutes of the headache- it will work better the faster you take it- can  repeat in 2 hours if first tablet didn't work warden/ranger, but cannot repeat more than 2 tabs in 24 hours. So 100 mg max 2  tabs/in 24 hours and 10 tablets given with 5 refills.   3. Con't Tramadol - for chronic pain -esp back pain- - has refills   4. Last drug screen 09/2023- so not due currently.    5.    Will have nurse recheck vitals- standing- will also give water and have pt not leave until vitals stable for allowing her to leave   6. F/U 3 months- and trp injections then and in 1-2 weeks- trP injections ant both appts.    I spent a total of 32   minutes on total care today- >50% coordination of care- due to concern about pt's lightheaded and presyncope- and dealt with that and her migraines- waited on Trp injections since already feeling bad -  .      "

## 2024-03-31 NOTE — Patient Instructions (Signed)
 Pt is a 64 yr old female with hx of asthma, prediabetes; HTN, BMI of 32- down to 31.38; and chronic low back pain with sciatica as well as hx of lumbar fusion- L4/5 had surgery for back pain. Here for evaluation of back pain s/p L4/5 back fusion and new RLE weakness.  Here for f/u on back pain/RLE weakness.    Needs trp injections, however pt looked/felt like was going to pass out- so will reschedule her until next week for Trp Injections- and wait til then, so could make her feel worse.   2. For migraines-  Will try Imitrex /Sumitriptan- 100 mg take within, if possible, the first 15 minutes of the headache- it will work better the faster you take it- can  repeat in 2 hours if first tablet didn't work warden/ranger, but cannot repeat more than 2 tabs in 24 hours. So 100 mg max 2 tabs/in 24 hours and 10 tablets given with 5 refills.   3. Con't Tramadol - for chronic pain -esp back pain- - has refills   4. Last drug screen 09/2023- so not due currently.    5.    Will have nurse recheck vitals- standing- will also give water and have pt not leave until vitals stable for allowing her to leave   6. F/U 3 months- and trp injections then and in 1-2 weeks.

## 2024-04-12 ENCOUNTER — Encounter: Admitting: Physical Medicine and Rehabilitation

## 2024-04-19 ENCOUNTER — Encounter: Admitting: Podiatry

## 2024-05-03 ENCOUNTER — Encounter: Admitting: Podiatry

## 2024-05-17 ENCOUNTER — Encounter: Admitting: Podiatry

## 2024-05-24 ENCOUNTER — Encounter: Admitting: Family Medicine

## 2024-06-08 ENCOUNTER — Ambulatory Visit: Admitting: Family Medicine

## 2024-07-12 ENCOUNTER — Encounter: Admitting: Physical Medicine and Rehabilitation
